# Patient Record
Sex: Male | Born: 1950 | Race: White | Hispanic: No | Marital: Married | Smoking: Never smoker
Health system: Southern US, Community
[De-identification: ages and names within clinical notes are randomized; demographics above are authoritative.]

## PROBLEM LIST (undated history)

## (undated) DIAGNOSIS — K219 Gastro-esophageal reflux disease without esophagitis: Secondary | ICD-10-CM

## (undated) DIAGNOSIS — E119 Type 2 diabetes mellitus without complications: Secondary | ICD-10-CM

## (undated) DIAGNOSIS — E785 Hyperlipidemia, unspecified: Secondary | ICD-10-CM

## (undated) DIAGNOSIS — Z889 Allergy status to unspecified drugs, medicaments and biological substances status: Secondary | ICD-10-CM

## (undated) DIAGNOSIS — I10 Essential (primary) hypertension: Secondary | ICD-10-CM

## (undated) DIAGNOSIS — I2510 Atherosclerotic heart disease of native coronary artery without angina pectoris: Secondary | ICD-10-CM

## (undated) DIAGNOSIS — F419 Anxiety disorder, unspecified: Secondary | ICD-10-CM

## (undated) HISTORY — DX: Essential (primary) hypertension: I10

## (undated) HISTORY — DX: Atherosclerotic heart disease of native coronary artery without angina pectoris: I25.10

## (undated) HISTORY — DX: Allergy status to unspecified drugs, medicaments and biological substances: Z88.9

## (undated) HISTORY — DX: Anxiety disorder, unspecified: F41.9

## (undated) HISTORY — DX: Gastro-esophageal reflux disease without esophagitis: K21.9

## (undated) HISTORY — DX: Hyperlipidemia, unspecified: E78.5

## (undated) HISTORY — DX: Type 2 diabetes mellitus without complications: E11.9

---

## 1989-02-18 HISTORY — PX: LUMBAR LAMINECTOMY: SHX95

## 2000-11-18 DIAGNOSIS — K649 Unspecified hemorrhoids: Secondary | ICD-10-CM | POA: Insufficient documentation

## 2006-02-07 ENCOUNTER — Ambulatory Visit: Payer: Self-pay | Admitting: Gastroenterology

## 2006-02-26 ENCOUNTER — Ambulatory Visit: Payer: Self-pay | Admitting: Gastroenterology

## 2006-02-26 ENCOUNTER — Encounter (INDEPENDENT_AMBULATORY_CARE_PROVIDER_SITE_OTHER): Payer: Self-pay | Admitting: Specialist

## 2006-06-03 ENCOUNTER — Encounter (INDEPENDENT_AMBULATORY_CARE_PROVIDER_SITE_OTHER): Payer: Self-pay

## 2006-06-03 DIAGNOSIS — M549 Dorsalgia, unspecified: Secondary | ICD-10-CM | POA: Insufficient documentation

## 2006-10-14 ENCOUNTER — Telehealth (INDEPENDENT_AMBULATORY_CARE_PROVIDER_SITE_OTHER): Payer: Self-pay | Admitting: *Deleted

## 2006-10-27 ENCOUNTER — Ambulatory Visit: Payer: Self-pay | Admitting: Gastroenterology

## 2006-11-05 ENCOUNTER — Encounter: Payer: Self-pay | Admitting: Gastroenterology

## 2006-11-05 ENCOUNTER — Ambulatory Visit: Payer: Self-pay | Admitting: Gastroenterology

## 2006-11-11 ENCOUNTER — Ambulatory Visit: Payer: Self-pay | Admitting: Critical Care Medicine

## 2007-01-12 ENCOUNTER — Ambulatory Visit: Payer: Self-pay | Admitting: Gastroenterology

## 2007-03-14 DIAGNOSIS — I10 Essential (primary) hypertension: Secondary | ICD-10-CM | POA: Insufficient documentation

## 2007-03-14 DIAGNOSIS — K449 Diaphragmatic hernia without obstruction or gangrene: Secondary | ICD-10-CM | POA: Insufficient documentation

## 2007-03-14 DIAGNOSIS — K219 Gastro-esophageal reflux disease without esophagitis: Secondary | ICD-10-CM | POA: Insufficient documentation

## 2007-03-14 DIAGNOSIS — D126 Benign neoplasm of colon, unspecified: Secondary | ICD-10-CM | POA: Insufficient documentation

## 2007-03-14 DIAGNOSIS — N200 Calculus of kidney: Secondary | ICD-10-CM | POA: Insufficient documentation

## 2008-02-19 HISTORY — PX: ANGIOPLASTY: SHX39

## 2008-07-12 ENCOUNTER — Inpatient Hospital Stay (HOSPITAL_BASED_OUTPATIENT_CLINIC_OR_DEPARTMENT_OTHER): Admission: RE | Admit: 2008-07-12 | Discharge: 2008-07-12 | Payer: Self-pay | Admitting: Interventional Cardiology

## 2008-07-12 ENCOUNTER — Inpatient Hospital Stay (HOSPITAL_COMMUNITY): Admission: AD | Admit: 2008-07-12 | Discharge: 2008-07-13 | Payer: Self-pay | Admitting: Cardiology

## 2010-05-29 LAB — BASIC METABOLIC PANEL
BUN: 9 mg/dL (ref 6–23)
CO2: 31 mEq/L (ref 19–32)
Calcium: 8.3 mg/dL — ABNORMAL LOW (ref 8.4–10.5)
Chloride: 104 mEq/L (ref 96–112)
Creatinine, Ser: 1.1 mg/dL (ref 0.4–1.5)
GFR calc Af Amer: >60 mL/min (ref >60)
GFR calc non Af Amer: >60 mL/min (ref >60)
Glucose, Bld: 96 mg/dL (ref 70–99)
Potassium: 3.8 mEq/L (ref 3.5–5.1)
Sodium: 138 mEq/L (ref 135–145)

## 2010-05-29 LAB — CBC
HCT: 42 % (ref 39.0–52.0)
Hemoglobin: 14.8 g/dL (ref 13.0–17.0)
MCHC: 35.3 g/dL (ref 30.0–36.0)
MCV: 91.1 fL (ref 78.0–100.0)
Platelets: 101 10*3/uL — ABNORMAL LOW (ref 150–400)
RBC: 4.61 MIL/uL (ref 4.22–5.81)
RDW: 13.5 % (ref 11.5–15.5)
WBC: 5.7 10*3/uL (ref 4.0–10.5)

## 2010-07-03 NOTE — Discharge Summary (Signed)
[name], [name]             ACCOUNT NO.:  [id number]   MEDICAL RECORD NO.:  [id number]          PATIENT TYPE:  INP   LOCATION:  2508                         FACILITY:  [facility]   PHYSICIAN:  Jake Bathe, MD      DATE OF BIRTH:  04-02-1950   DATE OF ADMISSION:  07/12/2008  DATE OF DISCHARGE:  07/13/2008                               DISCHARGE SUMMARY   DISCHARGE DIAGNOSES:  1. Coronary artery disease, status post drug-eluting stent to the left      anterior descending.  2. Hypertension.  3. Benign prostatic hypertrophy.  4. Dyslipidemia.  5. Gastroesophageal reflux disease.  6. Depression.  7. Chronic back pain.  8. Erectile dysfunction.  9. Seasonal allergic rhinitis.   HOSPITAL COURSE:  [name] is a 60 year old male patient, who is  initially seen in the office for evaluation of exertional chest pain.  It was decided that he would have a cardiac catheterization at that  point because of his symptoms that were some for angina.  He went to the  cath lab on Jul 12, 2008, and was found to have no disease in the left  main.  The LAD had a proximal 95% lesion, over the first diagonal hazy  proximal 50-60% lesion, and at the second diagonal, there was 50-60%  stenosis.  The circumflex had minimal disease and the right coronary  artery had a 50% mid RCA lesion.  There were some mild hypokinesis of  the anterior wall, but the EF was normal.   At this point, Dr. Corliss Marcus intervened and placed a Taxus drug-  eluting stent (3.5/16) into the vessel.  The patient tolerated the  procedure well, went home the next day.   LABORATORY STUDIES:  Hemoglobin 14.8, hematocrit 42.0, white count 5.7,  and platelets 101.  Sodium 138, potassium 3.8, BUN 9, and creatinine  1.10.   The patient is being discharged to home in stable, but improved  condition.  He is to clean his cath site gently with soap and water.  No  scrubbing.  Remain on a low-sodium, heart-healthy diet.  Increase  activity slowly.  No lifting over 10 pounds for 1 week.  No driving for  2 days.  Follow up with Dr. Anne Fu on July 25, 2008, at 12:15 p.m.   DISCHARGE MEDICATIONS:  1. Propecia 1 a day.  2. Gabapentin 300 mg twice a day.  3. Vitamin E daily.  4. Tylenol as needed.  5. Saw palmetto as before.  6. Diovan HCTZ 80/12.5 mg 1 tablet daily.  7. Enteric-coated aspirin 325 mg a day.  8. Metoprolol 50 mg a day.  This will be stopped and we will change      this to metoprolol ER 25 mg once a day.  With this, his heart rate      was in the 50s.  During his hospitalization, we decreased the dose.  9. Lexapro 10 mg a day.  10.Omeprazole 40 mg a day.  11.Plavix 75 mg a day.       Guy Franco, P.A.      Jake Bathe,  MD  Electronically Signed    LB/MEDQ  D:  07/13/2008  T:  07/13/2008  Job:  [id number]   cc:   Thora Lance, M.D.  Jake Bathe, MD

## 2010-07-03 NOTE — Assessment & Plan Note (Signed)
[facility]                         GASTROENTEROLOGY OFFICE NOTE   [name], ASHBY                 MRN:          [id number]  DATE:01/12/2007                            DOB:          October 03, 1950    This is a return office visit for a chronic cough.  His symptoms are  occasionally precipitated by eating or drinking.  His symptoms are also  brought on by speaking for even a moderately long period of time.  His  symptoms have improved on Prilosec 20 mg p.o. b.i.d.  Upper endoscopy  revealed a hiatal hernia and some mild distal esophageal erythema.  The  esophageal biopsies were however entirely normal.   CURRENT MEDICATIONS:  Listed on the chart, updated and reviewed.   MEDICATION ALLERGIES:  NONE KNOWN.   EXAMINATION:  No acute distress.  Weight 202.4 pounds, blood pressure is  102/60, pulse 64 and regular.  CHEST:  Clear to auscultation bilaterally.  CARDIAC:  Regular rate and rhythm without murmurs.  ABDOMEN:  Soft and nontender with normoactive bowel sounds.   ASSESSMENT/PLAN:  Cough, hoarseness and vocal fatigue.  Presumed  gastroesophageal reflux disease with LPR.  His symptoms have improved  but have not resolved.  Changed to Nexium 40 mg p.o. b.i.d. taken 30  minutes before breakfast and dinner.  Maintain all standard anti-reflux  measures including the use of 4 inch bed blocks.  Return office visit 3  months.     [name]. Russella Dar, MD, [facility]  Electronically Signed    MTS/MedQ  DD: 01/12/2007  DT: 01/12/2007  Job #: [id number]   cc:   Thora Lance, M.D.  Charlcie Cradle Delford Field, MD, FCCP

## 2010-07-03 NOTE — Cardiovascular Report (Signed)
[name], [name]             ACCOUNT NO.:  [id number]   MEDICAL RECORD NO.:  [id number]          PATIENT TYPE:  OIB   LOCATION:  1965                         FACILITY:  [facility]   PHYSICIAN:  Jake Bathe, MD      DATE OF BIRTH:  07-09-1950   DATE OF PROCEDURE:  07/12/2008  DATE OF DISCHARGE:  07/12/2008                            CARDIAC CATHETERIZATION   PROCEDURES:  1. Left heart catheterization.  2. Selective coronary angiography.  3. Left ventriculogram.   INDICATIONS:  Angina.  A 60 year old male with exertional angina, left-  sided chest pain, no prior CAD history, no smoking, no family history,  treated for both hypertension and hyperlipidemia.   PROCEDURE DETAILS:  Informed consent was obtained.  Risk of stroke,  heart attack, death, renal impairment, bleeding were explained to the  patient at length.  He was placed in the catheterization table and  prepped in a sterile fashion.  Lidocaine 1% was used for local  anesthesia after visualizing the femoral head with fluoroscopy.  The 4-  French sheath was placed into the right femoral vein using the modified  Seldinger technique.  Judkins left #4 catheter was used to selectively  cannulate the left main artery and no torque Williams right catheter was  used to selectively cannulate the right coronary artery.  Multiple views  with hand injection of Omnipaque were obtained.  The angled pigtail was  then used to cross the aortic valve into the left ventricle.  Hemodynamics obtained and a left ventriculogram in the RAO position  utilizing power injection of 30 mL of contrast was obtained.  Pullback  obtained.  After the procedure, the sheath was sewn in place and  findings were discussed with both he and Dr. Eldridge Dace.   FINDINGS:  1. Left main artery - branches into the LAD and circumflex artery.      There was no angiographically significant coronary artery disease.  2. Left anterior descending artery - there was severe  proximal 95%      stenosis, encompassing the first diagonal branch which has      approximately 50% ostial stenosis, which is slightly hazy.  His mid      LAD also has a 50-60% lesion just at the second diagonal      bifurcation.  There is TIMI II grade flow throughout the LAD.  3. Circumflex artery - there were two obtuse marginal branches - no      angiographically significant coronary artery disease present.  4. Right coronary artery - there is focal 40-50% mid RCA lesion      appears nonobstructive, eccentric.  Otherwise, no angiographically      significant coronary artery disease.   Left ventriculogram - there is mild mid anterior wall hypokinesis.  Otherwise, preserved ejection fraction of 60%, systolic pressure is  103/22 with an aortic pressure of 100/50 with no significant gradient.  No significant mitral regurgitation present.   IMPRESSION:  1. Severe left anterior descending coronary artery disease in the      proximal and mid distribution.  2. Nonobstructive disease in the right coronary artery, mid  region, 40-      50%.  3. Mild anterior wall mid hypokinesis with otherwise preserved      ejection fraction of 60%.  4. Elevated left ventricular end-diastolic pressure consistent with      diastolic dysfunction.   PLAN:  Findings have been discussed with both Dr. Eldridge Dace and Dr. Corliss Marcus.  We will go ahead and give him 600 mg of Plavix now, bolus him  with heparin 4000 units so in the sheath and take him upstairs to the  cardiac catheterization lab for urgent percutaneous intervention.  He is  currently chest pain-free.   FINDINGS:  Findings have been explained to both the patient and his  family.      Jake Bathe, MD  Electronically Signed     MCS/MEDQ  D:  07/12/2008  T:  07/13/2008  Job:  [id number]

## 2010-07-03 NOTE — Assessment & Plan Note (Signed)
[facility]                         GASTROENTEROLOGY OFFICE NOTE   [name], [name]                 MRN:          [id number]  DATE:10/27/2006                            DOB:          01-09-1951    REFERRING PHYSICIAN:  Thora Lance, M.D.   REASON FOR CONSULTATION:  Dysphagia and GERD.   HISTORY OF PRESENT ILLNESS:  The patient is a 60 year old white male  formerly followed by Dr. Victorino Dike.  He underwent colonoscopy in  January 2008, which showed a small adenomatous colon polyp.  He has had  a history of GERD complicated by peptic strictures in the past and  underwent upper endoscopy with [facility] dilation by Dr. Victorino Dike in  December 1991.  A small hiatal hernia, erosive esophagitis and a distal  esophageal stricture was noted at that examination.  The patient states  that he has taken acid suppressants intermittently over the years.  Several weeks ago, he developed a severe cough with associated fatigue.  Since that time, he has noted intermittent difficulty swallowing solids  and liquids.  He notes occasional nausea and some intermittent sharp  right upper quadrant pain.  He has noted bright red blood per rectum  with several bowel movements.  Hemorrhoids were not noted on his  colonoscopy from earlier this year.  He has had problems with  hoarseness, vocal fatigue and a sore throat that had been ongoing for  several weeks.  There is no family history of colon cancer, colon polyps  or inflammatory bowel disease.   PAST MEDICAL HISTORY:  1. Hypertension.  2. Kidney stones.  3. Adenomatous colon polyps.  4. 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  5. Status post inguinal hernia repair.  6. Status post back surgery in 1991, 1992 and 1994.  7. Status post right knee surgery in 1993.   CURRENT MEDICATIONS:  Listed on the chart, updated and reviewed.   ALLERGIES:  NO KNOWN DRUG ALLERGIES.   SOCIAL HISTORY:  Per  handwritten form.   REVIEW OF SYSTEMS:  Per handwritten form.   PHYSICAL EXAMINATION:  VITAL SIGNS:  Height 5 feet 11 inches, weight  194.6 pounds.  Blood pressure 112/60, pulse 56 and regular.  GENERAL APPEARANCE:  A well-developed, well-nourished white male in no  acute distress.  HEENT:  Sclerae are anicteric.  Oropharynx clear.  CHEST:  Clear to auscultation bilaterally.  CARDIOVASCULAR:  Regular rate and rhythm without murmur appreciated.  ABDOMEN:  Soft, nontender, nondistended with normal active bowel sounds,  no palpable organomegaly, masses or hernias.  NEUROLOGIC:  Alert and oriented x3.  Grossly nonfocal.   ASSESSMENT/PLAN:  1. Presumed gastroesophageal reflux disease with solid and liquid      dysphagia.  His symptoms may be related to pharyngeal irritation      and his ongoing cough.  Need to exclude erosive esophagitis, peptic      strictures, infectious esophagitis and other disorders.  Change      Prilosec to 40 mg p.o. q.a.m. and begin all standard antireflux      measures.  Risks, benefits, and alternatives to upper endoscopy  with possible biopsy and possible dilatation discussed with the      patient and he consents to proceed.  This will be scheduled      electively.  Both he and his wife had multiple questions about      esophageal dilatation as he has had a friend who passed away      following a perforation caused by esophageal dilation. I answered      all their questions to their satisfaction.  2. Personal history of adenomatous colon polyps and recent small      volume hematochezia.  If his hematochezia persists, he will return      for flexible sigmoidoscopy for further evaluation.  Recall      colonoscopy recommended for January 2013.     [name]. Russella Dar, MD, [employer]  Electronically Signed    MTS/MedQ  DD: 10/27/2006  DT: 10/28/2006  Job #: [id number]

## 2010-07-03 NOTE — Assessment & Plan Note (Signed)
[facility]                             PULMONARY OFFICE NOTE   DELMAS, FAUCETT                 MRN:          [id number]  DATE:11/11/2006                            DOB:          Jun 16, 1950    Mr. Kakar is a 60 year old white male who has had chronic cough now  for 8 weeks, mainly clear mucus is produced, but most of the cough is  dry.  He denies any sore throat.  He has had ongoing heartburn and  reflux symptoms for which he has had an evaluation by Dr. Russella Dar,  endoscopy shows esophagitis and distal stricture of the esophageus.  This has recently been dilated.  He is on Prilosec now 40 mg daily.  He  awakens at night with cough.  He is worse if he lies flat.  He begins to  cough if he talks too much.  There is no real relief of the cough.  He  has no dysphagia, no post-nasal drip, no nasal congestion.  He is not  short of breath at rest or with exertion.  He has no wheezing.  He is a  life-long never-smoker.  Referred now for further evaluation.   PAST MEDICAL HISTORY:  History of hypertension.  No heart disease.  No  stroke, blood clots, cancer, renal disease, sleep disorders, or  disorders of the central nervous system.  He has had back surgery in  1992.   MEDICATION ALLERGIES:  None.   SOCIAL HISTORY:  Nonsmoker.  Lives at home with his wife.  Is a  Surveyor, minerals.   FAMILY HISTORY:  Mother had breast cancer.   REVIEW OF SYSTEMS:  Otherwise, noncontributory.   CURRENT MEDICATIONS:  1. Gabapentin 600 mg daily.  2. Prilosec 40 mg daily.  3. Lexapro 10 mg daily.  4. Diovan/hydrochlorothiazide 80/12.5 daily.  5. Metoprolol 50 mg daily.   EXAM:  This is a middle-aged male in no distress.  Temperature 97, blood pressure 110/68, pulse 50, saturation 89% on room  air.  CHEST:  Showed to be clear with no evidence of wheeze, rales, or  rhonchi.  CARDIAC:  Showed a regular rate and rhythm without S3.  Normal S1, S2.  ABDOMEN:  Soft,  slightly tender in the epigastric area.  EXTREMITIES:  No clubbing, edema, or venous disease.  SKIN:  Clear.  NEUROLOGIC:  Intact.  HEENT:  No jugular venous distension.  No lymphadenopathy.   Spirometry showed moderate severe restrictive defect, but no evidence of  obstructive defect.  Chest x-ray showed no active lung disease form September 10, 2006.   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   PLAN:  Cyclic cough protocol utilizing Tussionex and Tessalon pearls.  Begin Zegerid 40 mg daily for 1 month interval of time then switch back  to Prilosec daily.  Reflux diet was given to the patient.  No other  pulmonary workup is indicated.  Will see the patient back in 2 months.     Charlcie Cradle Delford Field, MD, [facility]  Electronically Signed    PEW/MedQ  DD: 11/11/2006  DT: 11/11/2006  Job #: [id number]   cc:   [name]. Russella Dar, MD, [facility]  Thora Lance, M.D.

## 2011-02-05 ENCOUNTER — Encounter: Payer: Self-pay | Admitting: Gastroenterology

## 2011-02-25 ENCOUNTER — Other Ambulatory Visit: Payer: Self-pay | Admitting: Dermatology

## 2011-05-29 ENCOUNTER — Encounter: Payer: Self-pay | Admitting: Gastroenterology

## 2011-05-29 ENCOUNTER — Ambulatory Visit (AMBULATORY_SURGERY_CENTER): Payer: Medicare Other | Admitting: *Deleted

## 2011-05-29 VITALS — Ht 70.5 in | Wt 207.5 lb

## 2011-05-29 DIAGNOSIS — Z1211 Encounter for screening for malignant neoplasm of colon: Secondary | ICD-10-CM

## 2011-05-29 MED ORDER — PEG-KCL-NACL-NASULF-NA ASC-C 100 G PO SOLR: ORAL | Status: DC

## 2011-06-10 ENCOUNTER — Other Ambulatory Visit: Payer: Self-pay | Admitting: Gastroenterology

## 2011-06-12 ENCOUNTER — Ambulatory Visit (AMBULATORY_SURGERY_CENTER): Payer: Medicare Other | Admitting: Gastroenterology

## 2011-06-12 ENCOUNTER — Encounter: Payer: Self-pay | Admitting: Gastroenterology

## 2011-06-12 VITALS — BP 125/55 | HR 47 | Temp 97.5°F | Resp 18 | Ht 70.5 in | Wt 207.0 lb

## 2011-06-12 DIAGNOSIS — Z1211 Encounter for screening for malignant neoplasm of colon: Secondary | ICD-10-CM

## 2011-06-12 DIAGNOSIS — Z86010 Personal history of colonic polyps: Secondary | ICD-10-CM

## 2011-06-12 MED ORDER — SODIUM CHLORIDE 0.9 % IV SOLN: 500.0000 mL | INTRAVENOUS | Status: DC

## 2011-06-12 NOTE — Op Note (Signed)
[facility] [address] [place], [place]  [address]  COLONOSCOPY PROCEDURE REPORT  PATIENT:  [name], [name]  MR#:  [id number] BIRTHDATE:  04-Sep-1950, 61 yrs. old  GENDER:  male ENDOSCOPIST:  Judie Petit T. Russella Dar, MD, [facility]  PROCEDURE DATE:  06/12/2011 PROCEDURE:  Colonoscopy 34742 ASA CLASS:  Class II INDICATIONS:  1) surveillance and high-risk screening  2) history of pre-cancerous (adenomatous) colon polyps: 02/2006 MEDICATIONS:   MAC sedation, administered by CRNA, propofol (Diprivan) 240 mg IV DESCRIPTION OF PROCEDURE:   After the risks benefits and alternatives of the procedure were thoroughly explained, informed consent was obtained.  Digital rectal exam was performed and revealed no abnormalities.   The LB CF-[id number] endoscope was introduced through the anus and advanced to the cecum, which was identified by both the appendix and ileocecal valve, without limitations.  The quality of the prep was excellent, using MoviPrep.  The instrument was then slowly withdrawn as the colon was fully examined. <<PROCEDUREIMAGES>> FINDINGS:  A normal appearing cecum, ileocecal valve, and appendiceal orifice were identified. The ascending, hepatic flexure, transverse, splenic flexure, descending, sigmoid colon, and rectum appeared unremarkable.   Retroflexed views in the rectum revealed no abnormalities.  The time to cecum =  1.5 minutes. The scope was then withdrawn (time =  8  min) from the patient and the procedure completed.  COMPLICATIONS:  None  ENDOSCOPIC IMPRESSION: 1) Normal colon  RECOMMENDATIONS: 1) Repeat Colonoscopy in 5 years.  [name]. Russella Dar, MD, [facility]  CC:  Kirby Funk, MD  n. Rosalie DoctorVenita Lick. Sheala Dosh at 06/12/2011 10:22 AM  Janean Sark, [phone]

## 2011-06-12 NOTE — Patient Instructions (Signed)
Discharge instructions given with verbal understanding. Resume previous medications.YOU HAD AN ENDOSCOPIC PROCEDURE TODAY AT THE [facility]: Refer to the procedure report that was given to you for any specific questions about what was found during the examination.  If the procedure report does not answer your questions, please call your gastroenterologist to clarify.  If you requested that your care partner not be given the details of your procedure findings, then the procedure report has been included in a sealed envelope for you to review at your convenience later.  YOU SHOULD EXPECT: Some feelings of bloating in the abdomen. Passage of more gas than usual.  Walking can help get rid of the air that was put into your GI tract during the procedure and reduce the bloating. If you had a lower endoscopy (such as a colonoscopy or flexible sigmoidoscopy) you may notice spotting of blood in your stool or on the toilet paper. If you underwent a bowel prep for your procedure, then you may not have a normal bowel movement for a few days.  DIET: Your first meal following the procedure should be a light meal and then it is ok to progress to your normal diet.  A half-sandwich or bowl of soup is an example of a good first meal.  Heavy or fried foods are harder to digest and may make you feel nauseous or bloated.  Likewise meals heavy in dairy and vegetables can cause extra gas to form and this can also increase the bloating.  Drink plenty of fluids but you should avoid alcoholic beverages for 24 hours.  ACTIVITY: Your care partner should take you home directly after the procedure.  You should plan to take it easy, moving slowly for the rest of the day.  You can resume normal activity the day after the procedure however you should NOT DRIVE or use heavy machinery for 24 hours (because of the sedation medicines used during the test).    SYMPTOMS TO REPORT IMMEDIATELY: A gastroenterologist can be reached at  any hour.  During normal business hours, 8:30 AM to 5:00 PM Monday through Friday, call [phone].  After hours and on weekends, please call the GI answering service at [phone] who will take a message and have the physician on call contact you.   Following lower endoscopy (colonoscopy or flexible sigmoidoscopy):  Excessive amounts of blood in the stool  Significant tenderness or worsening of abdominal pains  Swelling of the abdomen that is new, acute  Fever of 100F or higher  FOLLOW UP: If any biopsies were taken you will be contacted by phone or by letter within the next 1-3 weeks.  Call your gastroenterologist if you have not heard about the biopsies in 3 weeks.  Our staff will call the home number listed on your records the next business day following your procedure to check on you and address any questions or concerns that you may have at that time regarding the information given to you following your procedure. This is a courtesy call and so if there is no answer at the home number and we have not heard from you through the emergency physician on call, we will assume that you have returned to your regular daily activities without incident.  SIGNATURES/CONFIDENTIALITY: You and/or your care partner have signed paperwork which will be entered into your electronic medical record.  These signatures attest to the fact that that the information above on your After Visit Summary has been reviewed and is understood.    Full responsibility of the confidentiality of this discharge information lies with you and/or your care-partner.  

## 2011-06-12 NOTE — Progress Notes (Signed)
Patient did not experience any of the following events: a burn prior to discharge; a fall within the facility; wrong site/side/patient/procedure/implant event; or a hospital transfer or hospital admission upon discharge from the facility. (G8907) Patient did not have preoperative order for IV antibiotic SSI prophylaxis. (G8918)  

## 2011-06-13 ENCOUNTER — Telehealth: Payer: Self-pay

## 2011-06-13 NOTE — Telephone Encounter (Signed)
  Follow up Call-  Call back number 06/12/2011  Post procedure Call Back phone  # [phone]  Permission to leave phone message Yes     Patient questions:  Do you have a fever, pain , or abdominal swelling? no Pain Score  0 *  Have you tolerated food without any problems? yes  Have you been able to return to your normal activities? yes  Do you have any questions about your discharge instructions: Diet   no Medications  no Follow up visit  no  Do you have questions or concerns about your Care? no  Actions: * If pain score is 4 or above: No action needed, pain <4.

## 2012-01-30 ENCOUNTER — Other Ambulatory Visit: Payer: Self-pay | Admitting: Internal Medicine

## 2012-01-30 ENCOUNTER — Ambulatory Visit
Admission: RE | Admit: 2012-01-30 | Discharge: 2012-01-30 | Disposition: A | Discharge status: Home / Self Care | Payer: Medicare Other | Source: Ambulatory Visit | Attending: Internal Medicine | Admitting: Internal Medicine

## 2012-01-30 DIAGNOSIS — M545 Low back pain, unspecified: Secondary | ICD-10-CM

## 2012-01-30 DIAGNOSIS — R109 Unspecified abdominal pain: Secondary | ICD-10-CM

## 2013-01-26 ENCOUNTER — Ambulatory Visit: Payer: Medicare Other | Admitting: Cardiology

## 2013-01-28 ENCOUNTER — Encounter: Payer: Self-pay | Admitting: Cardiology

## 2013-01-29 ENCOUNTER — Ambulatory Visit (INDEPENDENT_AMBULATORY_CARE_PROVIDER_SITE_OTHER): Payer: Medicare Other | Admitting: Cardiology

## 2013-01-29 ENCOUNTER — Encounter: Payer: Self-pay | Admitting: Cardiology

## 2013-01-29 VITALS — BP 120/72 | HR 53 | Ht 70.5 in | Wt 202.0 lb

## 2013-01-29 DIAGNOSIS — E7800 Pure hypercholesterolemia, unspecified: Secondary | ICD-10-CM

## 2013-01-29 DIAGNOSIS — I10 Essential (primary) hypertension: Secondary | ICD-10-CM

## 2013-01-29 DIAGNOSIS — I2510 Atherosclerotic heart disease of native coronary artery without angina pectoris: Secondary | ICD-10-CM

## 2013-01-29 DIAGNOSIS — E1169 Type 2 diabetes mellitus with other specified complication: Secondary | ICD-10-CM | POA: Insufficient documentation

## 2013-01-29 HISTORY — DX: Atherosclerotic heart disease of native coronary artery without angina pectoris: I25.10

## 2013-01-29 NOTE — Patient Instructions (Signed)
Your physician recommends that you continue on your current medications as directed. Please refer to the Current Medication list given to you today.  Your physician wants you to follow-up in: 1 year with dr. Skains. You will receive a reminder letter in the mail two months in advance. If you don't receive a letter, please call our office to schedule the follow-up appointment.  

## 2013-01-29 NOTE — Progress Notes (Signed)
[address], [address] [place], [place]  [address] Phone: [phone] Fax:  [phone]  Date:  01/29/2013   ID:  [name], DOB 04-14-1950, MRN [id number]  PCP:  Lillia Mountain, MD   History of Present Illness: [name] is a 62 y.o. male with coronary artery disease status post drug-eluting stent in May of 2010 to his proximal LAD with EF of 60% here for followup. Lipids last checked were excellent. We decided to discontinue his Zetia and change him to atorvastatin. Discussed the rationale for that. His blood pressure is under excellent control currently. Overall he is doing excellent without any chest pain (Except for fleeting sometimes GERD-like discomfort.),no shortness of breath, syncope, fevers.  Denies any chest pain/exertional chest pain. Occasionally while laying down will feel a twinge of left-sided chest discomfort. Fleeting. Enjoying his part-time job with [employer]. Went to [facility]. [employer]. One of the drivers has type 1 diabetes.     Wt Readings from Last 3 Encounters:  01/29/13 202 lb (91.627 kg)  06/12/11 207 lb (93.895 kg)  05/29/11 207 lb 8 oz (94.121 kg)     Past Medical History  Diagnosis Date  . Hypertension   . Hx of seasonal allergies   . Anxiety   . CAD (coronary artery disease)   . Hyperlipidemia     Past Surgical History  Procedure Laterality Date  . Angioplasty  2010    stent  . Lumbar laminectomy  1991    L3 and T12    Current Outpatient Prescriptions  Medication Sig Dispense Refill  . cholecalciferol (VITAMIN D) 1000 UNITS tablet Take 1,000 Units by mouth daily.      . citalopram (CELEXA) 20 MG tablet Take 20 mg by mouth daily.       Marland Kitchen co-enzyme Q-10 30 MG capsule Take 30 mg by mouth daily.      . fluorouracil (EFUDEX) 5 % cream Apply topically as needed.       . gabapentin (NEURONTIN) 300 MG capsule Takes 2 tablets daily      . metoprolol tartrate (LOPRESSOR) 25 MG tablet Take 25 mg by mouth daily.        . ranitidine (ZANTAC) 150 MG tablet Pt is taking 3-4 tablets a week      . saw palmetto 160 MG capsule Take 160 mg by mouth daily.      . valsartan-hydrochlorothiazide (DIOVAN-HCT) 80-12.5 MG per tablet Take 1 tablet by mouth daily.        No current facility-administered medications for this visit.    Allergies:   No Known Allergies  Social History:  The patient  reports that he has never smoked. He has never used smokeless tobacco. He reports that he drinks alcohol. He reports that he does not use illicit drugs.   ROS:  Please see the history of present illness.   Denies any syncope, bleeding, orthopnea, PND    PHYSICAL EXAM: VS:  BP 120/72  Pulse 53  Ht 5' 10.5" (1.791 m)  Wt 202 lb (91.627 kg)  BMI 28.56 kg/m2 Well nourished, well developed, in no acute distress HEENT: normal Neck: no JVD Cardiac:  normal S1, S2; RRR; no murmur Lungs:  clear to auscultation bilaterally, no wheezing, rhonchi or rales Abd: soft, nontender, no hepatomegaly Ext: no edema Skin: warm and dry Neuro: no focal abnormalities noted  EKG:  Sinus bradycardia rate 53 with no other abnormalities.     ASSESSMENT AND PLAN:  1. Coronary artery disease-currently doing well, no exertional anginal symptoms. Reviewed anatomy as above. Aggressive secondary prevention. 2. Angina-currently well controlled. 3. Hyperlipidemia-currently on atorvastatin. Previously we had stopped his Zetia. Triglycerides were slightly elevated at 230 at last check 1/14. LDL 53.]  Signed, Donato Schultz, MD [facility]  01/29/2013 2:18 PM

## 2013-03-19 DIAGNOSIS — Z23 Encounter for immunization: Secondary | ICD-10-CM | POA: Diagnosis not present

## 2013-03-19 DIAGNOSIS — E782 Mixed hyperlipidemia: Secondary | ICD-10-CM | POA: Diagnosis not present

## 2013-03-19 DIAGNOSIS — F325 Major depressive disorder, single episode, in full remission: Secondary | ICD-10-CM | POA: Diagnosis not present

## 2013-03-19 DIAGNOSIS — K219 Gastro-esophageal reflux disease without esophagitis: Secondary | ICD-10-CM | POA: Diagnosis not present

## 2013-03-19 DIAGNOSIS — Z125 Encounter for screening for malignant neoplasm of prostate: Secondary | ICD-10-CM | POA: Diagnosis not present

## 2013-03-19 DIAGNOSIS — M79609 Pain in unspecified limb: Secondary | ICD-10-CM | POA: Diagnosis not present

## 2013-03-19 DIAGNOSIS — Z1331 Encounter for screening for depression: Secondary | ICD-10-CM | POA: Diagnosis not present

## 2013-03-19 DIAGNOSIS — I10 Essential (primary) hypertension: Secondary | ICD-10-CM | POA: Diagnosis not present

## 2013-11-02 DIAGNOSIS — Z23 Encounter for immunization: Secondary | ICD-10-CM | POA: Diagnosis not present

## 2013-11-02 DIAGNOSIS — M549 Dorsalgia, unspecified: Secondary | ICD-10-CM | POA: Diagnosis not present

## 2013-11-02 DIAGNOSIS — I10 Essential (primary) hypertension: Secondary | ICD-10-CM | POA: Diagnosis not present

## 2013-11-12 DIAGNOSIS — S83419A Sprain of medial collateral ligament of unspecified knee, initial encounter: Secondary | ICD-10-CM | POA: Diagnosis not present

## 2013-11-12 DIAGNOSIS — M25569 Pain in unspecified knee: Secondary | ICD-10-CM | POA: Diagnosis not present

## 2014-01-18 ENCOUNTER — Other Ambulatory Visit: Payer: Self-pay | Admitting: Dermatology

## 2014-01-18 DIAGNOSIS — L821 Other seborrheic keratosis: Secondary | ICD-10-CM | POA: Diagnosis not present

## 2014-01-18 DIAGNOSIS — D0462 Carcinoma in situ of skin of left upper limb, including shoulder: Secondary | ICD-10-CM | POA: Diagnosis not present

## 2014-01-18 DIAGNOSIS — D485 Neoplasm of uncertain behavior of skin: Secondary | ICD-10-CM | POA: Diagnosis not present

## 2014-01-18 DIAGNOSIS — Z85828 Personal history of other malignant neoplasm of skin: Secondary | ICD-10-CM | POA: Diagnosis not present

## 2014-01-18 DIAGNOSIS — L570 Actinic keratosis: Secondary | ICD-10-CM | POA: Diagnosis not present

## 2014-03-03 DIAGNOSIS — Z23 Encounter for immunization: Secondary | ICD-10-CM | POA: Diagnosis not present

## 2014-03-03 DIAGNOSIS — N419 Inflammatory disease of prostate, unspecified: Secondary | ICD-10-CM | POA: Diagnosis not present

## 2014-03-03 DIAGNOSIS — N400 Enlarged prostate without lower urinary tract symptoms: Secondary | ICD-10-CM | POA: Diagnosis not present

## 2014-03-30 DIAGNOSIS — F325 Major depressive disorder, single episode, in full remission: Secondary | ICD-10-CM | POA: Diagnosis not present

## 2014-03-30 DIAGNOSIS — N400 Enlarged prostate without lower urinary tract symptoms: Secondary | ICD-10-CM | POA: Diagnosis not present

## 2014-03-30 DIAGNOSIS — I10 Essential (primary) hypertension: Secondary | ICD-10-CM | POA: Diagnosis not present

## 2014-03-30 DIAGNOSIS — Z1389 Encounter for screening for other disorder: Secondary | ICD-10-CM | POA: Diagnosis not present

## 2014-03-30 DIAGNOSIS — Z0000 Encounter for general adult medical examination without abnormal findings: Secondary | ICD-10-CM | POA: Diagnosis not present

## 2014-03-30 DIAGNOSIS — G4700 Insomnia, unspecified: Secondary | ICD-10-CM | POA: Diagnosis not present

## 2014-03-30 DIAGNOSIS — K219 Gastro-esophageal reflux disease without esophagitis: Secondary | ICD-10-CM | POA: Diagnosis not present

## 2014-03-30 DIAGNOSIS — I2510 Atherosclerotic heart disease of native coronary artery without angina pectoris: Secondary | ICD-10-CM | POA: Diagnosis not present

## 2014-03-30 DIAGNOSIS — E782 Mixed hyperlipidemia: Secondary | ICD-10-CM | POA: Diagnosis not present

## 2014-05-18 ENCOUNTER — Other Ambulatory Visit: Payer: Self-pay | Admitting: Dermatology

## 2014-05-18 DIAGNOSIS — B078 Other viral warts: Secondary | ICD-10-CM | POA: Diagnosis not present

## 2014-05-18 DIAGNOSIS — Z0000 Encounter for general adult medical examination without abnormal findings: Secondary | ICD-10-CM | POA: Diagnosis not present

## 2014-05-18 DIAGNOSIS — L821 Other seborrheic keratosis: Secondary | ICD-10-CM | POA: Diagnosis not present

## 2014-05-18 DIAGNOSIS — D0462 Carcinoma in situ of skin of left upper limb, including shoulder: Secondary | ICD-10-CM | POA: Diagnosis not present

## 2014-05-18 DIAGNOSIS — Z125 Encounter for screening for malignant neoplasm of prostate: Secondary | ICD-10-CM | POA: Diagnosis not present

## 2014-05-18 DIAGNOSIS — D485 Neoplasm of uncertain behavior of skin: Secondary | ICD-10-CM | POA: Diagnosis not present

## 2014-05-18 DIAGNOSIS — L570 Actinic keratosis: Secondary | ICD-10-CM | POA: Diagnosis not present

## 2014-06-24 ENCOUNTER — Encounter: Payer: Self-pay | Admitting: Gastroenterology

## 2014-10-31 DIAGNOSIS — L820 Inflamed seborrheic keratosis: Secondary | ICD-10-CM | POA: Diagnosis not present

## 2014-10-31 DIAGNOSIS — D225 Melanocytic nevi of trunk: Secondary | ICD-10-CM | POA: Diagnosis not present

## 2014-10-31 DIAGNOSIS — L814 Other melanin hyperpigmentation: Secondary | ICD-10-CM | POA: Diagnosis not present

## 2014-10-31 DIAGNOSIS — L821 Other seborrheic keratosis: Secondary | ICD-10-CM | POA: Diagnosis not present

## 2014-10-31 DIAGNOSIS — L570 Actinic keratosis: Secondary | ICD-10-CM | POA: Diagnosis not present

## 2015-02-03 DIAGNOSIS — Z0000 Encounter for general adult medical examination without abnormal findings: Secondary | ICD-10-CM | POA: Diagnosis not present

## 2015-02-03 DIAGNOSIS — Z024 Encounter for examination for driving license: Secondary | ICD-10-CM | POA: Diagnosis not present

## 2015-03-15 DIAGNOSIS — J3489 Other specified disorders of nose and nasal sinuses: Secondary | ICD-10-CM | POA: Diagnosis not present

## 2015-03-31 DIAGNOSIS — Z1389 Encounter for screening for other disorder: Secondary | ICD-10-CM | POA: Diagnosis not present

## 2015-03-31 DIAGNOSIS — F325 Major depressive disorder, single episode, in full remission: Secondary | ICD-10-CM | POA: Diagnosis not present

## 2015-03-31 DIAGNOSIS — M5136 Other intervertebral disc degeneration, lumbar region: Secondary | ICD-10-CM | POA: Diagnosis not present

## 2015-03-31 DIAGNOSIS — I10 Essential (primary) hypertension: Secondary | ICD-10-CM | POA: Diagnosis not present

## 2015-03-31 DIAGNOSIS — R739 Hyperglycemia, unspecified: Secondary | ICD-10-CM | POA: Diagnosis not present

## 2015-03-31 DIAGNOSIS — Z0000 Encounter for general adult medical examination without abnormal findings: Secondary | ICD-10-CM | POA: Diagnosis not present

## 2015-03-31 DIAGNOSIS — E782 Mixed hyperlipidemia: Secondary | ICD-10-CM | POA: Diagnosis not present

## 2015-04-05 DIAGNOSIS — R7301 Impaired fasting glucose: Secondary | ICD-10-CM | POA: Diagnosis not present

## 2015-05-10 DIAGNOSIS — L570 Actinic keratosis: Secondary | ICD-10-CM | POA: Diagnosis not present

## 2015-05-10 DIAGNOSIS — L820 Inflamed seborrheic keratosis: Secondary | ICD-10-CM | POA: Diagnosis not present

## 2015-05-30 DIAGNOSIS — H2513 Age-related nuclear cataract, bilateral: Secondary | ICD-10-CM | POA: Diagnosis not present

## 2015-05-30 DIAGNOSIS — H26492 Other secondary cataract, left eye: Secondary | ICD-10-CM | POA: Diagnosis not present

## 2015-10-16 DIAGNOSIS — I10 Essential (primary) hypertension: Secondary | ICD-10-CM | POA: Diagnosis not present

## 2015-10-30 ENCOUNTER — Telehealth: Payer: Self-pay | Admitting: Cardiology

## 2015-10-30 ENCOUNTER — Encounter (HOSPITAL_COMMUNITY): Payer: Self-pay | Admitting: *Deleted

## 2015-10-30 ENCOUNTER — Observation Stay (HOSPITAL_COMMUNITY)
Admission: EM | Admit: 2015-10-30 | Discharge: 2015-10-31 | Disposition: A | Discharge status: Home / Self Care | Payer: Medicare Other | Attending: Internal Medicine | Admitting: Internal Medicine

## 2015-10-30 ENCOUNTER — Emergency Department (HOSPITAL_COMMUNITY): Payer: Medicare Other

## 2015-10-30 DIAGNOSIS — R0602 Shortness of breath: Secondary | ICD-10-CM | POA: Diagnosis not present

## 2015-10-30 DIAGNOSIS — I25119 Atherosclerotic heart disease of native coronary artery with unspecified angina pectoris: Secondary | ICD-10-CM

## 2015-10-30 DIAGNOSIS — R079 Chest pain, unspecified: Secondary | ICD-10-CM | POA: Diagnosis not present

## 2015-10-30 DIAGNOSIS — Z955 Presence of coronary angioplasty implant and graft: Secondary | ICD-10-CM | POA: Diagnosis not present

## 2015-10-30 DIAGNOSIS — I10 Essential (primary) hypertension: Secondary | ICD-10-CM | POA: Diagnosis not present

## 2015-10-30 DIAGNOSIS — I2510 Atherosclerotic heart disease of native coronary artery without angina pectoris: Secondary | ICD-10-CM | POA: Diagnosis not present

## 2015-10-30 DIAGNOSIS — K219 Gastro-esophageal reflux disease without esophagitis: Secondary | ICD-10-CM | POA: Diagnosis present

## 2015-10-30 DIAGNOSIS — R0789 Other chest pain: Principal | ICD-10-CM | POA: Insufficient documentation

## 2015-10-30 DIAGNOSIS — Z79899 Other long term (current) drug therapy: Secondary | ICD-10-CM | POA: Diagnosis not present

## 2015-10-30 DIAGNOSIS — E785 Hyperlipidemia, unspecified: Secondary | ICD-10-CM | POA: Diagnosis not present

## 2015-10-30 DIAGNOSIS — Z7982 Long term (current) use of aspirin: Secondary | ICD-10-CM | POA: Insufficient documentation

## 2015-10-30 DIAGNOSIS — R001 Bradycardia, unspecified: Secondary | ICD-10-CM | POA: Diagnosis present

## 2015-10-30 DIAGNOSIS — R110 Nausea: Secondary | ICD-10-CM | POA: Diagnosis not present

## 2015-10-30 DIAGNOSIS — K210 Gastro-esophageal reflux disease with esophagitis: Secondary | ICD-10-CM

## 2015-10-30 DIAGNOSIS — D696 Thrombocytopenia, unspecified: Secondary | ICD-10-CM | POA: Diagnosis not present

## 2015-10-30 LAB — BASIC METABOLIC PANEL
Anion gap: 4 — ABNORMAL LOW (ref 5–15)
BUN: 11 mg/dL (ref 6–20)
CO2: 31 mmol/L (ref 22–32)
Calcium: 9 mg/dL (ref 8.9–10.3)
Chloride: 101 mmol/L (ref 101–111)
Creatinine, Ser: 1.09 mg/dL (ref 0.61–1.24)
GFR calc Af Amer: >60 mL/min (ref >60)
GFR calc non Af Amer: >60 mL/min (ref >60)
Glucose, Bld: 113 mg/dL — ABNORMAL HIGH (ref 65–99)
Potassium: 4 mmol/L (ref 3.5–5.1)
Sodium: 136 mmol/L (ref 135–145)

## 2015-10-30 LAB — CBC
HCT: 44.2 % (ref 39.0–52.0)
Hemoglobin: 15.5 g/dL (ref 13.0–17.0)
MCH: 32.1 pg (ref 26.0–34.0)
MCHC: 35.1 g/dL (ref 30.0–36.0)
MCV: 91.5 fL (ref 78.0–100.0)
Platelets: 122 10*3/uL — ABNORMAL LOW (ref 150–400)
RBC: 4.83 MIL/uL (ref 4.22–5.81)
RDW: 12.9 % (ref 11.5–15.5)
WBC: 6.2 10*3/uL (ref 4.0–10.5)

## 2015-10-30 LAB — I-STAT TROPONIN, ED: Troponin i, poc: 0 ng/mL (ref 0.00–0.08)

## 2015-10-30 MED ORDER — FINASTERIDE 1 MG PO TABS: 1.0000 mg | ORAL_TABLET | Freq: Every day | ORAL | Status: DC

## 2015-10-30 MED ORDER — ASPIRIN EC 81 MG PO TBEC
81.0000 mg | DELAYED_RELEASE_TABLET | Freq: Every day | ORAL | Status: DC
  Administered 2015-10-31: 81 mg via ORAL
  Filled 2015-10-30: qty 1

## 2015-10-30 MED ORDER — ACETAMINOPHEN 325 MG PO TABS: 650.0000 mg | ORAL_TABLET | ORAL | Status: DC | PRN

## 2015-10-30 MED ORDER — FAMOTIDINE IN NACL 20-0.9 MG/50ML-% IV SOLN
20.0000 mg | Freq: Two times a day (BID) | INTRAVENOUS | Status: DC
  Administered 2015-10-31 (×2): 20 mg via INTRAVENOUS
  Filled 2015-10-30 (×3): qty 50

## 2015-10-30 MED ORDER — CO Q 10 100 MG PO CAPS: 200.0000 mg | ORAL_CAPSULE | Freq: Every day | ORAL | Status: DC

## 2015-10-30 MED ORDER — NITROGLYCERIN 0.4 MG SL SUBL: 0.4000 mg | SUBLINGUAL_TABLET | SUBLINGUAL | Status: DC | PRN

## 2015-10-30 MED ORDER — VALSARTAN-HYDROCHLOROTHIAZIDE 80-12.5 MG PO TABS: 1.0000 | ORAL_TABLET | Freq: Every morning | ORAL | Status: DC

## 2015-10-30 MED ORDER — IRBESARTAN 150 MG PO TABS
75.0000 mg | ORAL_TABLET | Freq: Every day | ORAL | Status: DC
  Administered 2015-10-31: 75 mg via ORAL
  Filled 2015-10-30: qty 1

## 2015-10-30 MED ORDER — ATORVASTATIN CALCIUM 40 MG PO TABS
40.0000 mg | ORAL_TABLET | Freq: Every day | ORAL | Status: DC
  Administered 2015-10-30: 40 mg via ORAL
  Filled 2015-10-30: qty 1

## 2015-10-30 MED ORDER — ONDANSETRON HCL 4 MG/2ML IJ SOLN: 4.0000 mg | Freq: Four times a day (QID) | INTRAMUSCULAR | Status: DC | PRN

## 2015-10-30 MED ORDER — GABAPENTIN 300 MG PO CAPS
600.0000 mg | ORAL_CAPSULE | Freq: Every day | ORAL | Status: DC
  Administered 2015-10-30: 600 mg via ORAL
  Filled 2015-10-30: qty 2

## 2015-10-30 MED ORDER — HYDROCHLOROTHIAZIDE 12.5 MG PO CAPS
12.5000 mg | ORAL_CAPSULE | Freq: Every day | ORAL | Status: DC
  Administered 2015-10-31: 12.5 mg via ORAL
  Filled 2015-10-30: qty 1

## 2015-10-30 MED ORDER — HYDROCODONE-ACETAMINOPHEN 5-325 MG PO TABS: 1.0000 | ORAL_TABLET | Freq: Four times a day (QID) | ORAL | Status: DC | PRN

## 2015-10-30 MED ORDER — ENOXAPARIN SODIUM 40 MG/0.4ML ~~LOC~~ SOLN
40.0000 mg | Freq: Every day | SUBCUTANEOUS | Status: DC
  Administered 2015-10-30: 40 mg via SUBCUTANEOUS
  Filled 2015-10-30: qty 0.4

## 2015-10-30 MED ORDER — CITALOPRAM HYDROBROMIDE 20 MG PO TABS
20.0000 mg | ORAL_TABLET | Freq: Every day | ORAL | Status: DC
  Administered 2015-10-31: 20 mg via ORAL
  Filled 2015-10-30: qty 1

## 2015-10-30 NOTE — Telephone Encounter (Signed)
New Message  Pt c/o of Chest Pain: STAT if CP now or developed within 24 hours  1. Are you having CP right now? Yes  2. Are you experiencing any other symptoms (ex. SOB, nausea, vomiting, sweating)? Little SOB  3. How long have you been experiencing CP? All weekend  4. Is your CP continuous or coming and going? Continuous  5. Have you taken Nitroglycerin? No, but pt asked wife where it is.  Pt having pain left center of chest ?

## 2015-10-30 NOTE — H&P (Signed)
History and Physical    [name] [id number] DOB: Dec 04, 1950 DOA: 10/30/2015  PCP: Irven Shelling, MD   Patient coming from: Home  Chief Complaint: Chest pain  HPI: [name] is a 65 y.o. male with medical history significant for hypertension, GERD, and CAD with stent to LAD in 2010, now presenting to the emergency department for evaluation of exertional chest pain for the past 4 days. The patient reports that he had been in his usual state of health until approximately 4 days ago when he noted a dull ache in the central chest with radiation to the left shoulder and upper arm. These symptoms occurred while physically active and resolved over the course of several minutes with rest. He went on to have recurrent episodes of this, with symptoms becoming more frequent and severe, prompting his presentation. Patient takes a daily aspirin and reports taking 4 baby aspirins prior to arrival. He has nitroglycerin at home but has not taken it for any of these episodes. Since the stent was placed in 2010, he has not had a repeat catheterization, stress testing, or echocardiogram performed. He had been doing quite well with no anginal complaints up until 4 days ago. Current symptoms are different than those he experienced prior to the stent placement. In 2010, he was having significant dyspnea with exertion, but that is not the case currently.  ED Course: Upon arrival to the ED, patient is found to be afebrile, saturating well on room air, bradycardic in the low 40s, and with vitals otherwise stable. EKG demonstrates sinus bradycardia with rate 45 and is otherwise normal. Chest x-ray features a mild elevation in the left hemidiaphragm but is negative for acute cardiopulmonary disease. BMP is unremarkable and CBC is notable only for a stable chronic thrombocytopenia. Troponin is undetectable. ED physician discussed the case with cardiology who felt that a medical admission would be more  appropriate. Patient has remained hemodynamically stable in the ED with no anginal complaints since his arrival. He'll be observed on the telemetry unit for ongoing evaluation and management of exertional chest pain in a patient with known CAD.  Review of Systems:  All other systems reviewed and apart from HPI, are negative.  Past Medical History:  Diagnosis Date  . Anxiety   . CAD (coronary artery disease)   . Coronary atherosclerosis of native coronary artery 01/29/2013  . Hx of seasonal allergies   . Hyperlipidemia   . Hypertension     Past Surgical History:  Procedure Laterality Date  . ANGIOPLASTY  2010   stent  . LUMBAR LAMINECTOMY  1991   L3 and T12     reports that he has never smoked. He has never used smokeless tobacco. He reports that he drinks alcohol. He reports that he does not use drugs.  Allergies  Allergen Reactions  . Chicken Allergy Shortness Of Breath, Nausea And Vomiting and Other (See Comments)    Headache SAME REACTION(S) TO [place]!!!!  . Morphine Shortness Of Breath  . Other Anaphylaxis    Ate a fruit salad and had to go to the E.R.  . Pineapple Anaphylaxis and Rash  . Naproxen Rash    Family History  Problem Relation Age of Onset  . Colon cancer Neg Hx   . Stomach cancer Neg Hx      Prior to Admission medications   Medication Sig Start Date End Date Taking? Authorizing Provider  acetaminophen (TYLENOL) 500 MG tablet Take 500 mg by mouth at bedtime.  Yes Historical Provider, MD  aspirin EC 81 MG tablet Take 81 mg by mouth daily.   Yes Historical Provider, MD  atorvastatin (LIPITOR) 40 MG tablet Take 40 mg by mouth at bedtime.   Yes Historical Provider, MD  citalopram (CELEXA) 20 MG tablet Take 20 mg by mouth daily.  04/24/11  Yes Historical Provider, MD  Coenzyme Q10 (CO Q 10) 100 MG CAPS Take 200 mg by mouth daily.   Yes Historical Provider, MD  finasteride (PROPECIA) 1 MG tablet Take 1 mg by mouth daily. 09/14/15  Yes Historical Provider, MD    fluorouracil (EFUDEX) 5 % cream Apply topically as needed.  02/25/11  Yes Historical Provider, MD  gabapentin (NEURONTIN) 300 MG capsule Take 600 mg by mouth at bedtime. And may take an additional 300 mg once a day if needed   Yes Historical Provider, MD  metoprolol tartrate (LOPRESSOR) 25 MG tablet Take 25 mg by mouth daily.  04/30/11  Yes Historical Provider, MD  Papaya CHEW Chew 1 tablet by mouth 2 (two) times daily as needed (for indigestion).   Yes Historical Provider, MD  Saw Palmetto 450 MG CAPS Take 1 capsule by mouth daily.   Yes Historical Provider, MD  traMADol (ULTRAM) 50 MG tablet Take 50 mg by mouth daily as needed (for pain).  10/17/15  Yes Historical Provider, MD  valsartan-hydrochlorothiazide (DIOVAN-HCT) 80-12.5 MG tablet Take 1 tablet by mouth every morning.   Yes Historical Provider, MD  valsartan-hydrochlorothiazide (DIOVAN-HCT) 80-12.5 MG per tablet Take 1 tablet by mouth daily.  05/19/11   Historical Provider, MD    Physical Exam: Vitals:   10/30/15 2045 10/30/15 2115 10/30/15 2145 10/30/15 2230  BP: 152/83 155/78 135/80 133/74  Pulse: (!) 42 (!) 44 (!) 42   Resp: 18 18 21 12   Temp:      TempSrc:      SpO2: 98% 97% 95% 94%  Weight:      Height:          Constitutional: NAD, calm, comfortable Eyes: PERTLA, lids and conjunctivae normal ENMT: Mucous membranes are moist. Posterior pharynx clear of any exudate or lesions.   Neck: normal, supple, no masses, no thyromegaly Respiratory: clear to auscultation bilaterally, no wheezing, no crackles. Normal respiratory effort.   Cardiovascular: Rate ~45 and regular without significant murmur. No extremity edema. 2+ pedal pulses. No carotid bruits. No significant JVD. Abdomen: No distension, no tenderness, no masses palpated. Bowel sounds normal.  Musculoskeletal: no clubbing / cyanosis. No joint deformity upper and lower extremities. Normal muscle tone.  Skin: no significant rashes, lesions, ulcers. Warm, dry,  well-perfused. Neurologic: CN 2-12 grossly intact. Sensation intact, DTR normal. Strength 5/5 in all 4 limbs.  Psychiatric: Normal judgment and insight. Alert and oriented x 3. Normal mood and affect.     Labs on Admission: I have personally reviewed following labs and imaging studies  CBC:  Recent Labs Lab 10/30/15 1553  WBC 6.2  HGB 15.5  HCT 44.2  MCV 91.5  PLT [id number]*   Basic Metabolic Panel:  Recent Labs Lab 10/30/15 1553  NA 136  K 4.0  CL 101  CO2 31  GLUCOSE 113*  BUN 11  CREATININE 1.09  CALCIUM 9.0   GFR: Estimated Creatinine Clearance: 70 mL/min (by C-G formula based on SCr of 1.09 mg/dL). Liver Function Tests: No results for input(s): AST, ALT, ALKPHOS, BILITOT, PROT, ALBUMIN in the last 168 hours. No results for input(s): LIPASE, AMYLASE in the last 168 hours. No results for  input(s): AMMONIA in the last 168 hours. Coagulation Profile: No results for input(s): INR, PROTIME in the last 168 hours. Cardiac Enzymes: No results for input(s): CKTOTAL, CKMB, CKMBINDEX, TROPONINI in the last 168 hours. BNP (last 3 results) No results for input(s): PROBNP in the last 8760 hours. HbA1C: No results for input(s): HGBA1C in the last 72 hours. CBG: No results for input(s): GLUCAP in the last 168 hours. Lipid Profile: No results for input(s): CHOL, HDL, LDLCALC, TRIG, CHOLHDL, LDLDIRECT in the last 72 hours. Thyroid Function Tests: No results for input(s): TSH, T4TOTAL, FREET4, T3FREE, THYROIDAB in the last 72 hours. Anemia Panel: No results for input(s): VITAMINB12, FOLATE, FERRITIN, TIBC, IRON, RETICCTPCT in the last 72 hours. Urine analysis: No results found for: COLORURINE, APPEARANCEUR, LABSPEC, PHURINE, GLUCOSEU, HGBUR, BILIRUBINUR, KETONESUR, PROTEINUR, UROBILINOGEN, NITRITE, LEUKOCYTESUR Sepsis Labs: @LABRCNTIP (procalcitonin:4,lacticidven:4) )No results found for this or any previous visit (from the past 240 hour(s)).   Radiological Exams on  Admission: Dg Chest 2 View  Result Date: 10/30/2015 CLINICAL DATA:  Chest pain EXAM: CHEST  2 VIEW COMPARISON:  None. FINDINGS: Normal heart size. Normal mediastinal contour. No pneumothorax. No pleural effusion. Lungs appear clear, with no acute consolidative airspace disease and no pulmonary edema. Mild elevation of the left hemidiaphragm. Partially visualized bilateral posterior spinal fusion hardware in the lower thoracic/ upper lumbar spine. IMPRESSION: Mild elevation of the left hemidiaphragm. Otherwise no active disease in the chest. Electronically Signed   By: Ilona Sorrel M.D.   On: 10/30/2015 17:13    EKG: Independently reviewed. Sinus bradycardia (rate 45), otherwise normal EKG  Assessment/Plan  1. Chest pain  - Exertional central CP with radiation to left shoulder and mild SOB - Initial EKG notable for bradycardia only, no acute ischemic features - Initial troponin 0.00  - ASA 324 mg taken pta, continue Lipitor, hold beta-blocker in setting of bradycardia  - Monitor on telemetry for ischemic changes, obtain serial troponin measurements and EKG's  - If remains asymptomatic overnight and troponin remains negative, could consider stress-testing in the am   2. CAD  - Hx of DES to LAD in May 2010 - Evaluating for ACS as above  - Continue daily ASA 81 and Lipitor  - Hold Lopressor d/t bradycardia, consider resuming as HR allows    3. Bradycardia  - Rates staying in low-mid 40s without sxs at rest  - Ruling-out ACS as above  - Likely secondary to beta-blocker, currently held    4. Hypertension - At goal currently  - Continue valsartan-HCTZ, hold metoprolol as above    5. GERD - Esophagitis noted on EGD from 2008; path from biopsies without metaplasia, dysplasia, or malignancy  - Previously on PPI, no longer on any acid-suppressors  - Given the exertional nature of the CP, doubt GI etiology, but will treat empirically with BID Pepcid for now   6. Thrombocytopenia - Platelet  count 122,000 and stable relative to prior CBC  - No suggestion of active blood loss on admission     DVT prophylaxis: sq Lovenox  Code Status: Full  Family Communication: Wife updated at bedside Disposition Plan: Observe on telemetry Consults called: None Admission status: Observation    Vianne Bulls, MD [facility] Pager [phone]  If 7PM-7AM, please contact night-coverage [email or web address] Password [facility]  10/30/2015, 10:52 PM

## 2015-10-30 NOTE — ED Notes (Signed)
Pt updated about wait time.  

## 2015-10-30 NOTE — ED Triage Notes (Addendum)
Pt states L sided chest pain and nausea x 5 days.  Pain increased today so he decided to come to ED.  Pt fell on tool box several weeks ago in same spot.

## 2015-10-30 NOTE — ED Provider Notes (Signed)
[facility] DEPT Provider Note   CSN: [id number] Arrival date & time: 10/30/15  1539     History   Chief Complaint Chief Complaint  Patient presents with  . Chest Pain    HPI [name] is a 65 y.o. male.   Chest Pain   This is a new problem. The current episode started more than 2 days ago. The problem occurs daily. The problem has not changed since onset.The pain is associated with exertion. The pain is present in the substernal region. The pain is at a severity of 5/10. The pain is mild. The quality of the pain is described as dull and exertional. The pain radiates to the left arm and left shoulder. The symptoms are aggravated by exertion. Associated symptoms include diaphoresis and nausea. Pertinent negatives include no abdominal pain, no cough and no shortness of breath. He has tried rest for the symptoms. The treatment provided moderate relief.    Past Medical History:  Diagnosis Date  . Anxiety   . CAD (coronary artery disease)   . Coronary atherosclerosis of native coronary artery 01/29/2013  . Hx of seasonal allergies   . Hyperlipidemia   . Hypertension     Patient Active Problem List   Diagnosis Date Noted  . Chest pain 10/30/2015  . Thrombocytopenia ([place]) 10/30/2015  . Bradycardia on ECG 10/30/2015  . CAD (coronary artery disease), native coronary artery 01/29/2013  . Pure hypercholesterolemia 01/29/2013  . COLONIC POLYPS, ADENOMATOUS 03/14/2007  . Essential hypertension 03/14/2007  . GERD (gastroesophageal reflux disease) 03/14/2007  . HIATAL HERNIA 03/14/2007  . NEPHROLITHIASIS 03/14/2007  . BACKACHE NOS 06/03/2006  . HEMORRHOIDS 11/18/2000    Past Surgical History:  Procedure Laterality Date  . ANGIOPLASTY  2010   stent  . LUMBAR LAMINECTOMY  1991   L3 and T12       Home Medications    Prior to Admission medications   Medication Sig Start Date End Date Taking? Authorizing Provider  acetaminophen (TYLENOL) 500 MG tablet Take 500  mg by mouth at bedtime.   Yes Historical Provider, MD  aspirin EC 81 MG tablet Take 81 mg by mouth daily.   Yes Historical Provider, MD  atorvastatin (LIPITOR) 40 MG tablet Take 40 mg by mouth at bedtime.   Yes Historical Provider, MD  citalopram (CELEXA) 20 MG tablet Take 20 mg by mouth daily.  04/24/11  Yes Historical Provider, MD  Coenzyme Q10 (CO Q 10) 100 MG CAPS Take 200 mg by mouth daily.   Yes Historical Provider, MD  finasteride (PROPECIA) 1 MG tablet Take 1 mg by mouth daily. 09/14/15  Yes Historical Provider, MD  fluorouracil (EFUDEX) 5 % cream Apply topically as needed.  02/25/11  Yes Historical Provider, MD  gabapentin (NEURONTIN) 300 MG capsule Take 600 mg by mouth at bedtime. And may take an additional 300 mg once a day if needed   Yes Historical Provider, MD  metoprolol tartrate (LOPRESSOR) 25 MG tablet Take 25 mg by mouth daily.  04/30/11  Yes Historical Provider, MD  Papaya CHEW Chew 1 tablet by mouth 2 (two) times daily as needed (for indigestion).   Yes Historical Provider, MD  Saw Palmetto 450 MG CAPS Take 1 capsule by mouth daily.   Yes Historical Provider, MD  traMADol (ULTRAM) 50 MG tablet Take 50 mg by mouth daily as needed (for pain).  10/17/15  Yes Historical Provider, MD  valsartan-hydrochlorothiazide (DIOVAN-HCT) 80-12.5 MG tablet Take 1 tablet by mouth every morning.   Yes  Historical Provider, MD  valsartan-hydrochlorothiazide (DIOVAN-HCT) 80-12.5 MG per tablet Take 1 tablet by mouth daily.  05/19/11   Historical Provider, MD    Family History Family History  Problem Relation Age of Onset  . Colon cancer Neg Hx   . Stomach cancer Neg Hx     Social History Social History  Substance Use Topics  . Smoking status: Never Smoker  . Smokeless tobacco: Never Used  . Alcohol use Yes     Comment: rare     Allergies   Chicken allergy; Morphine; Other; Pineapple; and Naproxen   Review of Systems Review of Systems  Constitutional: Positive for diaphoresis.  HENT:  Negative for congestion.   Respiratory: Negative for cough and shortness of breath.   Cardiovascular: Positive for chest pain.  Gastrointestinal: Positive for nausea. Negative for abdominal pain.  Endocrine: Negative for polydipsia and polyuria.  Skin: Negative for pallor.  All other systems reviewed and are negative.    Physical Exam Updated Vital Signs BP 135/71 (BP Location: Left Arm)   Pulse (!) 44   Temp 97.7 F (36.5 C) (Oral)   Resp 16   Ht 5\' 11"  (1.803 m)   Wt 195 lb 8 oz (88.7 kg)   SpO2 99%   BMI 27.27 kg/m   Physical Exam  Constitutional: He appears well-developed and well-nourished.  HENT:  Head: Normocephalic and atraumatic.  Eyes: Conjunctivae are normal.  Neck: Neck supple.  Cardiovascular: Regular rhythm.  Bradycardia present.   No murmur heard. Pulmonary/Chest: Effort normal and breath sounds normal. No respiratory distress.  Abdominal: Soft. There is no tenderness.  Musculoskeletal: He exhibits no edema.  Neurological: He is alert.  Skin: Skin is warm and dry.  Psychiatric: He has a normal mood and affect.  Nursing note and vitals reviewed.    ED Treatments / Results  Labs (all labs ordered are listed, but only abnormal results are displayed) Labs Reviewed  BASIC METABOLIC PANEL - Abnormal; Notable for the following:       Result Value   Glucose, Bld 113 (*)    Anion gap 4 (*)    All other components within normal limits  CBC - Abnormal; Notable for the following:    Platelets 122 (*)    All other components within normal limits  TROPONIN I  TSH  TROPONIN I  TROPONIN I  I-STAT TROPOININ, ED    EKG  EKG Interpretation  Date/Time:  Monday October 30 2015 15:51:13 EDT Ventricular Rate:  45 PR Interval:  166 QRS Duration: 110 QT Interval:  434 QTC Calculation: 375 R Axis:   27 Text Interpretation:  Sinus bradycardia Otherwise normal ECG When compared with ECG of 07/13/2008, No significant change was found Confirmed by [facility]  MD,  DAVID ([id number]) on 10/30/2015 3:56:58 PM       Radiology Dg Chest 2 View  Result Date: 10/30/2015 CLINICAL DATA:  Chest pain EXAM: CHEST  2 VIEW COMPARISON:  None. FINDINGS: Normal heart size. Normal mediastinal contour. No pneumothorax. No pleural effusion. Lungs appear clear, with no acute consolidative airspace disease and no pulmonary edema. Mild elevation of the left hemidiaphragm. Partially visualized bilateral posterior spinal fusion hardware in the lower thoracic/ upper lumbar spine. IMPRESSION: Mild elevation of the left hemidiaphragm. Otherwise no active disease in the chest. Electronically Signed   By: Ilona Sorrel M.D.   On: 10/30/2015 17:13    Procedures Procedures (including critical care time)  Medications Ordered in ED Medications  aspirin EC tablet 81 mg (  not administered)  atorvastatin (LIPITOR) tablet 40 mg (40 mg Oral Given 10/30/15 2350)  gabapentin (NEURONTIN) capsule 600 mg (600 mg Oral Given 10/30/15 2350)  citalopram (CELEXA) tablet 20 mg (not administered)  nitroGLYCERIN (NITROSTAT) SL tablet 0.4 mg (not administered)  acetaminophen (TYLENOL) tablet 650 mg (not administered)  ondansetron (ZOFRAN) injection 4 mg (not administered)  enoxaparin (LOVENOX) injection 40 mg (40 mg Subcutaneous Given 10/30/15 2350)  HYDROcodone-acetaminophen (NORCO/VICODIN) 5-325 MG per tablet 1-2 tablet (not administered)  irbesartan (AVAPRO) tablet 75 mg (not administered)    And  hydrochlorothiazide (MICROZIDE) capsule 12.5 mg (not administered)  famotidine (PEPCID) IVPB 20 mg premix (20 mg Intravenous Given 10/31/15 0034)     Initial Impression / Assessment and Plan / ED Course  I have reviewed the triage vital signs and the nursing notes.  Pertinent labs & imaging results that were available during my care of the patient were reviewed by me and considered in my medical decision making (see chart for details).  Clinical Course    65 yo M w/ left chest pressure and achiness  radiating to left arm worse with exertion, better with rest. ecg ok. Labs ok.  D/w cardiology about admission for stress testing, recommends medicine admission.  D/w medicine and will admit for same.   Final Clinical Impressions(s) / ED Diagnoses   Final diagnoses:  Chest pain, unspecified chest pain type    New Prescriptions Current Discharge Medication List       Merrily Pew, MD 10/31/15 [phone]

## 2015-10-30 NOTE — Telephone Encounter (Signed)
I spoke with the pt and he complains of a deep throbbing pain in his left chest and left arm numbness.  The pt's symptoms started last Friday and have continued to come and go.  The pt has experienced symptoms today and is having pain at this time.  The pt denies SOB but has had episodes of nausea.  The pt has not used NTG for symptoms.  I advised the pt to proceed to the [facility] ER at this time.  The pt will have his wife drive him and I advised that he contact EMS for transportation if symptoms worsen.  Pt agreed with plan.

## 2015-10-30 NOTE — Telephone Encounter (Signed)
I spoke with the pt's wife and the pt currently is not available for me to speak with at this time.  Per the pt's wife the pt is complaining of pain in the center of chest and left arm pain that is coming and going and started over the weekend.  The pt has had pain today. I made her aware that the pt needs to proceed to the ER for evaluation.  She did give me the pt's cell [phone] to call but I got his voicemail and left a message for him to contact the office. The pt's wife would like me to call her back at 2:30 at [phone] to speak with the pt.

## 2015-10-31 ENCOUNTER — Observation Stay (HOSPITAL_COMMUNITY): Payer: Medicare Other

## 2015-10-31 ENCOUNTER — Observation Stay (HOSPITAL_BASED_OUTPATIENT_CLINIC_OR_DEPARTMENT_OTHER): Payer: Medicare Other

## 2015-10-31 DIAGNOSIS — I25118 Atherosclerotic heart disease of native coronary artery with other forms of angina pectoris: Secondary | ICD-10-CM | POA: Diagnosis not present

## 2015-10-31 DIAGNOSIS — R0789 Other chest pain: Secondary | ICD-10-CM | POA: Diagnosis not present

## 2015-10-31 DIAGNOSIS — R001 Bradycardia, unspecified: Secondary | ICD-10-CM | POA: Diagnosis not present

## 2015-10-31 DIAGNOSIS — M79602 Pain in left arm: Secondary | ICD-10-CM | POA: Diagnosis not present

## 2015-10-31 DIAGNOSIS — I10 Essential (primary) hypertension: Secondary | ICD-10-CM

## 2015-10-31 DIAGNOSIS — R079 Chest pain, unspecified: Secondary | ICD-10-CM

## 2015-10-31 DIAGNOSIS — I2510 Atherosclerotic heart disease of native coronary artery without angina pectoris: Secondary | ICD-10-CM

## 2015-10-31 LAB — NM MYOCAR MULTI W/SPECT W/WALL MOTION / EF
Estimated workload: 1 METS
LV dias vol: 91 mL (ref 62–150)
LV sys vol: 40 mL
MPHR: 153 {beats}/min
Peak HR: 70 {beats}/min
Percent HR: 45 %
RATE: 0.24
Rest HR: 45 {beats}/min
SDS: 3
SRS: 6
SSS: 9
TID: 1.24

## 2015-10-31 LAB — LIPID PANEL
Cholesterol: 150 mg/dL (ref 0–200)
HDL: 40 mg/dL — ABNORMAL LOW (ref >40)
LDL Cholesterol: 70 mg/dL (ref 0–99)
Total CHOL/HDL Ratio: 3.8 RATIO
Triglycerides: 201 mg/dL — ABNORMAL HIGH (ref <150)
VLDL: 40 mg/dL (ref 0–40)

## 2015-10-31 LAB — TROPONIN I
Troponin I: <0.03 ng/mL (ref <0.03)
Troponin I: <0.03 ng/mL (ref <0.03)
Troponin I: <0.03 ng/mL (ref <0.03)

## 2015-10-31 LAB — TSH: TSH: 1.617 u[IU]/mL (ref 0.350–4.500)

## 2015-10-31 MED ORDER — REGADENOSON 0.4 MG/5ML IV SOLN
0.4000 mg | Freq: Once | INTRAVENOUS | Status: DC
  Filled 2015-10-31: qty 5

## 2015-10-31 MED ORDER — REGADENOSON 0.4 MG/5ML IV SOLN
INTRAVENOUS | Status: AC
  Filled 2015-10-31: qty 5

## 2015-10-31 MED ORDER — NITROGLYCERIN 0.4 MG SL SUBL: 0.4000 mg | SUBLINGUAL_TABLET | SUBLINGUAL | 0 refills | Status: DC | PRN

## 2015-10-31 MED ORDER — TECHNETIUM TC 99M TETROFOSMIN IV KIT
10.0000 | PACK | Freq: Once | INTRAVENOUS | Status: AC | PRN
  Administered 2015-10-31: 10 via INTRAVENOUS

## 2015-10-31 MED ORDER — FAMOTIDINE 20 MG PO TABS: 20.0000 mg | ORAL_TABLET | Freq: Two times a day (BID) | ORAL | Status: DC

## 2015-10-31 MED ORDER — REGADENOSON 0.4 MG/5ML IV SOLN
0.4000 mg | Freq: Once | INTRAVENOUS | Status: AC
Start: 2015-10-31 — End: 2015-10-31
  Administered 2015-10-31: 0.4 mg via INTRAVENOUS

## 2015-10-31 MED ORDER — TECHNETIUM TC 99M TETROFOSMIN IV KIT
30.0000 | PACK | Freq: Once | INTRAVENOUS | Status: AC | PRN
  Administered 2015-10-31: 30 via INTRAVENOUS

## 2015-10-31 NOTE — Progress Notes (Signed)
Patient given discharge instructions medication list, follow up appointment. IV and tele were dcd will discharge home as ordered. [name], Bettina Gavia RN

## 2015-10-31 NOTE — Consult Note (Signed)
CARDIOLOGY CONSULT NOTE   Patient ID: [name] MRN: [id number] DOB/AGE: [birth year] 67 y.o.  Admit date: 10/30/2015  Requesting Physician: Dr. Erlinda Johns Primary Physician:   Austin Shelling, MD Primary Cardiologist:  Dr. Marlou Johns Reason for Consultation: Chest pain   HPI: [name] is a 65 y.o. male with a history of HTN, HLD, GERD, sinus bradycardia and CAD s/p PCI/DES to LAD in 2010 who presented to [facility] on 10/30/15 with chest pain.   He presented with chest pain in 2010. He went to the cath lab on Jul 12, 2008, and was found to have LAD with a proximal 95% lesion s/p PCI/DES50% (full cath report below). There were some mild hypokinesis of the anterior wall, but the EF was normal.   He was last seen by Dr. Marlou Johns in 01/2013 and he was doing excellent.   He was in his usual state of health until this past week after he had an accident. He was helping his wife load up a truck for a trade show when a tool box fell on his chest and the ramp he was standing on collapsed below him. Since that time he's had chest discomfort that is exquisitely tender to palpation as well as left arm numbness prompting him to be evaluated. The symptoms are not reminiscent of his previous unstable angina and 2010 when he had significant exertional dyspnea as well as exertional weakness. He did not remember ever having chest pain at that time. He says the chest pain gets worse with exertion but he is not sure if this is due to movement. He currently has chest pain is worse with palpation. No lower extremity edema, orthopnea or PND. No dizziness or syncope. No exertional weakness.    Past Medical History:  Diagnosis Date  . Anxiety   . CAD (coronary artery disease)   . Coronary atherosclerosis of native coronary artery 01/29/2013  . Hx of seasonal allergies   . Hyperlipidemia   . Hypertension      Past Surgical History:  Procedure Laterality Date  . ANGIOPLASTY  2010   stent  . LUMBAR  LAMINECTOMY  1991   L3 and T12    Allergies  Allergen Reactions  . Chicken Allergy Shortness Of Breath, Nausea And Vomiting and Other (See Comments)    Headache SAME REACTION(S) TO [place]!!!!  . Morphine Shortness Of Breath  . Other Anaphylaxis    Ate a fruit salad and had to go to the E.R.  . Pineapple Anaphylaxis and Rash  . Naproxen Rash    I have reviewed the patient's current medications . aspirin EC  81 mg Oral Daily  . atorvastatin  40 mg Oral QHS  . citalopram  20 mg Oral Daily  . enoxaparin (LOVENOX) injection  40 mg Subcutaneous QHS  . famotidine (PEPCID) IV  20 mg Intravenous Q12H  . gabapentin  600 mg Oral QHS  . irbesartan  75 mg Oral Daily   And  . hydrochlorothiazide  12.5 mg Oral Daily     acetaminophen, HYDROcodone-acetaminophen, nitroGLYCERIN, ondansetron (ZOFRAN) IV  Prior to Admission medications   Medication Sig Start Date End Date Taking? Authorizing Provider  acetaminophen (TYLENOL) 500 MG tablet Take 500 mg by mouth at bedtime.   Yes Historical Provider, MD  aspirin EC 81 MG tablet Take 81 mg by mouth daily.   Yes Historical Provider, MD  atorvastatin (LIPITOR) 40 MG tablet Take 40 mg by mouth at bedtime.   Yes Historical Provider,  MD  citalopram (CELEXA) 20 MG tablet Take 20 mg by mouth daily.  04/24/11  Yes Historical Provider, MD  Coenzyme Q10 (CO Q 10) 100 MG CAPS Take 200 mg by mouth daily.   Yes Historical Provider, MD  finasteride (PROPECIA) 1 MG tablet Take 1 mg by mouth daily. 09/14/15  Yes Historical Provider, MD  fluorouracil (EFUDEX) 5 % cream Apply topically as needed.  02/25/11  Yes Historical Provider, MD  gabapentin (NEURONTIN) 300 MG capsule Take 600 mg by mouth at bedtime. And may take an additional 300 mg once a day if needed   Yes Historical Provider, MD  metoprolol tartrate (LOPRESSOR) 25 MG tablet Take 25 mg by mouth daily.  04/30/11  Yes Historical Provider, MD  Papaya CHEW Chew 1 tablet by mouth 2 (two) times daily as needed (for  indigestion).   Yes Historical Provider, MD  Saw Palmetto 450 MG CAPS Take 1 capsule by mouth daily.   Yes Historical Provider, MD  traMADol (ULTRAM) 50 MG tablet Take 50 mg by mouth daily as needed (for pain).  10/17/15  Yes Historical Provider, MD  valsartan-hydrochlorothiazide (DIOVAN-HCT) 80-12.5 MG tablet Take 1 tablet by mouth every morning.   Yes Historical Provider, MD  valsartan-hydrochlorothiazide (DIOVAN-HCT) 80-12.5 MG per tablet Take 1 tablet by mouth daily.  05/19/11   Historical Provider, MD     Social History   Social History  . Marital status: Married    Spouse name: N/A  . Number of children: N/A  . Years of education: N/A   Occupational History  . Not on file.   Social History Main Topics  . Smoking status: Never Smoker  . Smokeless tobacco: Never Used  . Alcohol use Yes     Comment: rare  . Drug use: No  . Sexual activity: Not on file   Other Topics Concern  . Not on file   Social History Narrative  . No narrative on file    Family Status  Relation Status  . Mother Alive  . Father Deceased  . Neg Hx    Family History  Problem Relation Age of Onset  . Colon cancer Neg Hx   . Stomach cancer Neg Hx     ROS:  Full 14 point review of systems complete and found to be negative unless listed above.  Physical Exam: Blood pressure 125/64, pulse (!) 51, temperature 97.8 F (36.6 C), temperature source Oral, resp. rate 16, height 5\' 11"  (1.803 m), weight 195 lb 8 oz (88.7 kg), SpO2 97 %.  General: Well developed, well nourished, male in no acute distress Head: Eyes PERRLA, No xanthomas.   Normocephalic and atraumatic, oropharynx without edema or exudate. Dentition:  Lungs: CTAB Heart: HRRR S1 S2, no rub/gallop, Heart regular rate and rhythm with S1, S2  murmur. pulses are 2+ extrem.  Chest tender to palpation Neck: No carotid bruits. No lymphadenopathy. NO JVD. Abdomen: Bowel sounds present, abdomen soft and non-tender without masses or hernias noted. Msk:   No spine or cva tenderness. No weakness, no joint deformities or effusions. Extremities: No clubbing or cyanosis. No LE edema.  Neuro: Alert and oriented X 3. No focal deficits noted. Psych:  Good affect, responds appropriately Skin: No rashes or lesions noted.  Labs:   Lab Results  Component Value Date   WBC 6.2 10/30/2015   HGB 15.5 10/30/2015   HCT 44.2 10/30/2015   MCV 91.5 10/30/2015   PLT 122 (L) 10/30/2015   No results for input(s): INR in  the last 72 hours.  Recent Labs Lab 10/30/15 1553  NA 136  K 4.0  CL 101  CO2 31  BUN 11  CREATININE 1.09  CALCIUM 9.0  GLUCOSE 113*   No results found for: MG  Recent Labs  10/30/15 2344 10/31/15 0432  TROPONINI <0.03 <0.03    Recent Labs  10/30/15 1603  TROPIPOC 0.00    TSH  Date/Time Value Ref Range Status  10/30/2015 11:44 PM 1.617 0.350 - 4.500 uIU/mL Final   Cath 2010  IMPRESSION:  1. Severe left anterior descending coronary artery disease in the      proximal and mid distribution.  2. Nonobstructive disease in the right coronary artery, mid region, 40-      50%.  3. Mild anterior wall mid hypokinesis with otherwise preserved      ejection fraction of 60%.  4. Elevated left ventricular end-diastolic pressure consistent with      diastolic dysfunction.  PLAN:  Findings have been discussed with both Dr. Irish Lack and Dr. Rodell Perna.  We will go ahead and give him 600 mg of Plavix now, bolus him  with heparin 4000 units so in the sheath and take him upstairs to the  cardiac catheterization lab for urgent percutaneous intervention.  He is  currently chest pain-free.  FINDINGS:  Findings have been explained to both the patient and his  family.   Echo: Pending  ECG:  Sinus bradycardia HR 45 with TWI in lead III that could be artifact  Radiology:  Dg Chest 2 View  Result Date: 10/30/2015 CLINICAL DATA:  Chest pain EXAM: CHEST  2 VIEW COMPARISON:  None. FINDINGS: Normal heart size. Normal mediastinal  contour. No pneumothorax. No pleural effusion. Lungs appear clear, with no acute consolidative airspace disease and no pulmonary edema. Mild elevation of the left hemidiaphragm. Partially visualized bilateral posterior spinal fusion hardware in the lower thoracic/ upper lumbar spine. IMPRESSION: Mild elevation of the left hemidiaphragm. Otherwise no active disease in the chest. Electronically Signed   By: Ilona Sorrel M.D.   On: 10/30/2015 17:13    ASSESSMENT AND PLAN:    Principal Problem:   Chest pain Active Problems:   Essential hypertension   GERD (gastroesophageal reflux disease)   CAD (coronary artery disease), native coronary artery   Thrombocytopenia (HCC)   Bradycardia on ECG  [name] is a 65 y.o. male with a history of HTN, HLD, GERD, sinus bradycardia and CAD s/p PCI/DES to LAD in 2010 who presented to [facility] on 10/30/15 with chest pain.   Chest pain: troponin neg x3. ECG with no acute ST or TW changes. Chest pain is atypical and not reminiscent of his previous unstable angina when he had cath in 2010. He did have an accident recently with injury to the chest wall. The chest is very tender to palpation with some left arm numbness. He is unsure if it's worse with exertion. Due to his previous cardiac history we will proceed with Lexiscan Myoview today. If negative he can be discharged home with follow-up in our clinic.  CAD: continue ASA, statin. Home Lopressor 25mg  BID has been held 2/2 bradycardia  HTN: BP well controlled on current regimen   Sinus bradycardia: asymptomatic. BB being held currently  Signed: Angelena Form, PA-C 10/31/2015 10:00 AM  Pager [phone]  Co-Sign MD  The patient has been seen in conjunction with Nell Range, PAC. All aspects of care have been considered and discussed. The patient has been personally interviewed, examined,  and all clinical data has been reviewed.   The patient has a prior history of coronary disease with proximal LAD  drug-eluting stent 2010. He is admitted on this occasion with chest discomfort that is clearly musculoskeletal. He also has arm discomfort that seems be related to cervical disc disease. Both these symptoms were preceded by chest trauma 2 weeks ago.  There is a preceding history of mild exertional chest discomfort over the last several months. This could be related to myocardial ischemia. He had mild nonobstructive coronary disease in the circumflex and right coronary at the time of LAD stenting. He has ruled out for myocardial infarction.Plan to perform myocardial perfusion study and if not high risk, he will be eligible for discharge later today.  Would recommend evaluation of above localized neck and arm discomfort by excluding cervical disc disease. The continuous sharp chest discomfort should be treated with anti-inflammatory therapy.

## 2015-10-31 NOTE — Discharge Summary (Signed)
Discharge Summary  [name] [id number] DOB: 1950/11/10  PCP: Irven Shelling, MD  Admit date: 10/30/2015 Discharge date: 10/31/2015  Time spent: <39mins  Recommendations for Outpatient Follow-up:  1. F/u with PMD within a week  for hospital discharge follow up, repeat cbc/bmp at follow up 2. F/u with cardiology on 9/26, patient is instructed to monitor bp and heart rate at home and bring in record for cardiologist to review. 3. Continue all home meds, only new meds nitroglycerin sublingal prn  Discharge Diagnoses:  Active Hospital Problems   Diagnosis Date Noted  . Chest pain 10/30/2015  . Thrombocytopenia ([place]) 10/30/2015  . Bradycardia on ECG 10/30/2015  . CAD (coronary artery disease), native coronary artery 01/29/2013  . GERD (gastroesophageal reflux disease) 03/14/2007  . Essential hypertension 03/14/2007    Resolved Hospital Problems   Diagnosis Date Noted Date Resolved  No resolved problems to display.    Discharge Condition: stable  Diet recommendation: heart healthy  Filed Weights   10/30/15 1549 10/30/15 2336  Weight: 86.2 kg (190 lb) 88.7 kg (195 lb 8 oz)    History of present illness:  Patient coming from: Home  Chief Complaint: Chest pain  HPI: [name] is a 65 y.o. male with medical history significant for hypertension, GERD, and CAD with stent to LAD in 2010, now presenting to the emergency department for evaluation of exertional chest pain for the past 4 days. The patient reports that he had been in his usual state of health until approximately 4 days ago when he noted a dull ache in the central chest with radiation to the left shoulder and upper arm. These symptoms occurred while physically active and resolved over the course of several minutes with rest. He went on to have recurrent episodes of this, with symptoms becoming more frequent and severe, prompting his presentation. Patient takes a daily aspirin and reports taking 4  baby aspirins prior to arrival. He has nitroglycerin at home but has not taken it for any of these episodes. Since the stent was placed in 2010, he has not had a repeat catheterization, stress testing, or echocardiogram performed. He had been doing quite well with no anginal complaints up until 4 days ago. Current symptoms are different than those he experienced prior to the stent placement. In 2010, he was having significant dyspnea with exertion, but that is not the case currently.  ED Course: Upon arrival to the ED, patient is found to be afebrile, saturating well on room air, bradycardic in the low 40s, and with vitals otherwise stable. EKG demonstrates sinus bradycardia with rate 45 and is otherwise normal. Chest x-ray features a mild elevation in the left hemidiaphragm but is negative for acute cardiopulmonary disease. BMP is unremarkable and CBC is notable only for a stable chronic thrombocytopenia. Troponin is undetectable. ED physician discussed the case with cardiology who felt that a medical admission would be more appropriate. Patient has remained hemodynamically stable in the ED with no anginal complaints since his arrival. He'll be observed on the telemetry unit for ongoing evaluation and management of exertional chest pain in a patient with known CAD.  Hospital Course:  Principal Problem:   Chest pain Active Problems:   Essential hypertension   GERD (gastroesophageal reflux disease)   CAD (coronary artery disease), native coronary artery   Thrombocytopenia (HCC)   Bradycardia on ECG   1. Chest pain  - Exertional central CP with radiation to left shoulder and mild SOB - Initial EKG notable for  bradycardia only, no acute ischemic features - troponin negative - ASA 324 mg taken pta, continue Lipitor, hold beta-blocker in setting of bradycardia  -  cardiology consulted , unremarkable stress-test on 9/12, patient is cleared to discharge by cardiology.  2. CAD  - Hx of DES to LAD in  May 2010 - - Continue daily ASA 81 and Lipitor , lopressor held initially, resumed at discharge per cardiology - unremarkable stress test, outpatient cardiology follow up.   3. Sinus Bradycardia  - Rates staying in low-mid 40s without sxs at rest  - negative stress test, tsh 1.6 - beta-blocker held initially, I have discussed with cardiology Dr Tamala Julian in person, Dr Tamala Julian recommend continue betablocker and close cardiology follow up arranged on 9/26.  4. Hypertension - At goal currently  - Continue valsartan-HCTZ, hold metoprolol held in the hospital and restarted per cardiology recommendation.   5. GERD - Esophagitis noted on EGD from 2008; path from biopsies without metaplasia, dysplasia, or malignancy  - Previously on PPI, no longer on any acid-suppressors  - Given the exertional nature of the CP, doubt GI etiology,   6. Thrombocytopenia - Platelet count 122,000 and stable relative to prior CBC  - No suggestion of active blood loss on admission , outpatient followup.   DVT prophylaxis while in the hospital:sq Lovenox  Code Status:Full  Family Communication:patient Disposition Plan:Observe on telemetry Consults called:cardiology   Procedures:  Stress test on 9/12 , unremarkable  Antibiotics:  none    Discharge Exam: BP (!) 167/79 (BP Location: Right Arm)   Pulse (!) 51   Temp 97.5 F (36.4 C) (Axillary)   Resp 17   Ht 5\' 11"  (1.803 m)   Wt 88.7 kg (195 lb 8 oz)   SpO2 100%   BMI 27.27 kg/m   General: NAD Cardiovascular: sinus bradycardia, chest wall tenderness Respiratory: CTABL  Discharge Instructions You were cared for by a hospitalist during your hospital stay. If you have any questions about your discharge medications or the care you received while you were in the hospital after you are discharged, you can call the unit and asked to speak with the hospitalist on call if the hospitalist that took care of you is not available. Once you are  discharged, your primary care physician will handle any further medical issues. Please note that NO REFILLS for any discharge medications will be authorized once you are discharged, as it is imperative that you return to your primary care physician (or establish a relationship with a primary care physician if you do not have one) for your aftercare needs so that they can reassess your need for medications and monitor your lab values.  Discharge Instructions    Diet - low sodium heart healthy    Complete by:  As directed   Increase activity slowly    Complete by:  As directed       Medication List    TAKE these medications   acetaminophen 500 MG tablet Commonly known as:  TYLENOL Take 500 mg by mouth at bedtime.   aspirin EC 81 MG tablet Take 81 mg by mouth daily.   atorvastatin 40 MG tablet Commonly known as:  LIPITOR Take 40 mg by mouth at bedtime.   citalopram 20 MG tablet Commonly known as:  CELEXA Take 20 mg by mouth daily.   Co Q 10 100 MG Caps Take 200 mg by mouth daily.   finasteride 1 MG tablet Commonly known as:  PROPECIA Take 1 mg by  mouth daily.   fluorouracil 5 % cream Commonly known as:  EFUDEX Apply topically as needed.   gabapentin 300 MG capsule Commonly known as:  NEURONTIN Take 600 mg by mouth at bedtime. And may take an additional 300 mg once a day if needed   metoprolol tartrate 25 MG tablet Commonly known as:  LOPRESSOR Take 25 mg by mouth daily.   nitroGLYCERIN 0.4 MG SL tablet Commonly known as:  NITROSTAT Place 1 tablet (0.4 mg total) under the tongue every 5 (five) minutes x 3 doses as needed for chest pain.   Papaya Chew Chew 1 tablet by mouth 2 (two) times daily as needed (for indigestion).   Saw Palmetto 450 MG Caps Take 1 capsule by mouth daily.   traMADol 50 MG tablet Commonly known as:  ULTRAM Take 50 mg by mouth daily as needed (for pain).   valsartan-hydrochlorothiazide 80-12.5 MG tablet Commonly known as:  DIOVAN-HCT Take  1 tablet by mouth every morning.      Allergies  Allergen Reactions  . Chicken Allergy Shortness Of Breath, Nausea And Vomiting and Other (See Comments)    Headache SAME REACTION(S) TO [place]!!!!  . Morphine Shortness Of Breath  . Other Anaphylaxis    Ate a fruit salad and had to go to the E.R.  . Pineapple Anaphylaxis and Rash  . Naproxen Rash   Follow-up Information    Irven Shelling, MD Follow up in 1 week(s).   Specialty:  Internal Medicine Why:  hospital discharge follow up, repeat cbc/bmp at follow up Contact information: [address] [place] [address] [phone]        Angelena Form, PA-C Follow up on 11/14/2015.   Specialties:  Cardiology, Radiology Why:  please monitor your blood pressure and heart rate at home , bring your bp and hr record to your cardiology apppointment. Contact information: [address] [place] [address] [phone]            The results of significant diagnostics from this hospitalization (including imaging, microbiology, ancillary and laboratory) are listed below for reference.    Significant Diagnostic Studies: Dg Chest 2 View  Result Date: 10/30/2015 CLINICAL DATA:  Chest pain EXAM: CHEST  2 VIEW COMPARISON:  None. FINDINGS: Normal heart size. Normal mediastinal contour. No pneumothorax. No pleural effusion. Lungs appear clear, with no acute consolidative airspace disease and no pulmonary edema. Mild elevation of the left hemidiaphragm. Partially visualized bilateral posterior spinal fusion hardware in the lower thoracic/ upper lumbar spine. IMPRESSION: Mild elevation of the left hemidiaphragm. Otherwise no active disease in the chest. Electronically Signed   By: Ilona Sorrel M.D.   On: 10/30/2015 17:13   Nm Myocar Multi W/spect W/wall Motion / Ef  Result Date: 10/31/2015  There was no ST segment deviation noted during stress.  No T wave inversion was noted during stress.  The  study is normal.  Nuclear stress EF: 56%.  The left ventricular ejection fraction is normal (55-65%).  Normal stress nuclear study with no ischemia or infarction; EF 56 with normal wall motion.    Microbiology: No results found for this or any previous visit (from the past 240 hour(s)).   Labs: Basic Metabolic Panel:  Recent Labs Lab 10/30/15 1553  NA 136  K 4.0  CL 101  CO2 31  GLUCOSE 113*  BUN 11  CREATININE 1.09  CALCIUM 9.0   Liver Function Tests: No results for input(s): AST, ALT, ALKPHOS, BILITOT, PROT, ALBUMIN  in the last 168 hours. No results for input(s): LIPASE, AMYLASE in the last 168 hours. No results for input(s): AMMONIA in the last 168 hours. CBC:  Recent Labs Lab 10/30/15 1553  WBC 6.2  HGB 15.5  HCT 44.2  MCV 91.5  PLT 122*   Cardiac Enzymes:  Recent Labs Lab 10/30/15 2344 10/31/15 0432 10/31/15 1137  TROPONINI <0.03 <0.03 <0.03   BNP: BNP (last 3 results) No results for input(s): BNP in the last 8760 hours.  ProBNP (last 3 results) No results for input(s): PROBNP in the last 8760 hours.  CBG: No results for input(s): GLUCAP in the last 168 hours.     SignedFlorencia Reasons MD, PhD  [facility] 10/31/2015, 6:43 PM

## 2015-10-31 NOTE — Progress Notes (Signed)
The patient was seen in nuclear medicine for a Lexiscan myoview. He tolerated the procedure well. No acute ST or TW changes on ECG.   Jettie Booze, NP  [facility]

## 2015-10-31 NOTE — Telephone Encounter (Signed)
Thank you. Agree Candee Furbish, MD

## 2015-11-06 DIAGNOSIS — R0789 Other chest pain: Secondary | ICD-10-CM | POA: Diagnosis not present

## 2015-11-06 DIAGNOSIS — R001 Bradycardia, unspecified: Secondary | ICD-10-CM | POA: Diagnosis not present

## 2015-11-07 ENCOUNTER — Ambulatory Visit (INDEPENDENT_AMBULATORY_CARE_PROVIDER_SITE_OTHER): Payer: Medicare Other | Admitting: Nurse Practitioner

## 2015-11-07 ENCOUNTER — Encounter (INDEPENDENT_AMBULATORY_CARE_PROVIDER_SITE_OTHER): Payer: Self-pay

## 2015-11-07 ENCOUNTER — Encounter: Payer: Self-pay | Admitting: Nurse Practitioner

## 2015-11-07 VITALS — BP 120/68 | HR 70 | Ht 71.0 in | Wt 198.4 lb

## 2015-11-07 DIAGNOSIS — R0789 Other chest pain: Secondary | ICD-10-CM

## 2015-11-07 DIAGNOSIS — I10 Essential (primary) hypertension: Secondary | ICD-10-CM

## 2015-11-07 DIAGNOSIS — R001 Bradycardia, unspecified: Secondary | ICD-10-CM

## 2015-11-07 MED ORDER — VALSARTAN-HYDROCHLOROTHIAZIDE 160-12.5 MG PO TABS
1.0000 | ORAL_TABLET | Freq: Every day | ORAL | 3 refills | Status: DC
Start: 2015-11-07 — End: 2016-08-07

## 2015-11-07 NOTE — Progress Notes (Signed)
CARDIOLOGY OFFICE NOTE  Date:  11/07/2015    [name] Date of Birth: November 24, 1950 Medical Record [id number]  PCP:  Irven Shelling, MD  Cardiologist:  [facility]  Chief Complaint  Patient presents with  . Follow-up    Post hospital visit - seen for Dr. Marlou Porch    History of Present Illness: [name] is a 65 y.o. male who presents today for a post hospital visit. Seen for Dr. Marlou Porch.   He has a history of HTN, GERD, and CAD with stent to LAD in 2010.   He had not been seen since December of 2014 in this office.   Presented earlier this month with chest pain -ended up in the ER. Was bradycardic in the low 40s, and with vitals otherwise stable. EKG demonstrated sinus bradycardia with rate 45 and was otherwise normal. Chest x-ray showed a mild elevation in the left hemidiaphragm but was negative for acute cardiopulmonary disease. BMP was unremarkable and CBC was notable only for a stable chronic thrombocytopenia. Troponin was undetectable. He was admitted and had unremarkable stress test. He was continued on beta blocker - he was asymptomatic with his bradycardia. No further work up felt to be needed.   Comes back today. Here with his wife. He has stopped his Metoprolol over the past few days due to low heart rates in the 40's. Saw his PCP yesterday who also advised staying off. BP tracking up some. He feels fine. 3 weeks ago - a tool box fell on his chest - they now think this might have caused his chest pain. No real chest pain syndrome with his stent - had more fatigue. Still has some soreness in his chest and notes soreness with palpation.   Past Medical History:  Diagnosis Date  . Anxiety   . CAD (coronary artery disease)   . Coronary atherosclerosis of native coronary artery 01/29/2013  . Hx of seasonal allergies   . Hyperlipidemia   . Hypertension     Past Surgical History:  Procedure Laterality Date  . ANGIOPLASTY  2010   stent  . LUMBAR LAMINECTOMY   1991   L3 and T12     Medications: Current Outpatient Prescriptions  Medication Sig Dispense Refill  . acetaminophen (TYLENOL) 500 MG tablet Take 500 mg by mouth at bedtime.    Marland Kitchen aspirin EC 81 MG tablet Take 81 mg by mouth daily.    Marland Kitchen atorvastatin (LIPITOR) 40 MG tablet Take 40 mg by mouth at bedtime.    . citalopram (CELEXA) 10 MG tablet Take 10 mg by mouth daily.    . Coenzyme Q10 (CO Q 10) 100 MG CAPS Take 200 mg by mouth daily.    . diphenhydramine-acetaminophen (TYLENOL PM) 25-500 MG TABS tablet Take 1 tablet by mouth at bedtime.    . finasteride (PROPECIA) 1 MG tablet Take 1 mg by mouth daily.    Marland Kitchen gabapentin (NEURONTIN) 300 MG capsule Take 600 mg by mouth at bedtime. And may take an additional 300 mg once a day if needed    . nitroGLYCERIN (NITROSTAT) 0.4 MG SL tablet Place 1 tablet (0.4 mg total) under the tongue every 5 (five) minutes x 3 doses as needed for chest pain. 30 tablet 0  . Papaya CHEW Chew 1 tablet by mouth 2 (two) times daily as needed (for indigestion).    . Saw Palmetto 450 MG CAPS Take 1 capsule by mouth daily.    . traMADol (ULTRAM) 50 MG tablet Take  50 mg by mouth daily as needed (for pain).     . valsartan-hydrochlorothiazide (DIOVAN HCT) 160-12.5 MG tablet Take 1 tablet by mouth daily. 90 tablet 3   No current facility-administered medications for this visit.     Allergies: Allergies  Allergen Reactions  . Chicken Allergy Shortness Of Breath, Nausea And Vomiting and Other (See Comments)    Headache SAME REACTION(S) TO [place]!!!!  . Morphine Shortness Of Breath  . Other Anaphylaxis    Ate a fruit salad and had to go to the E.R.  . Pineapple Anaphylaxis and Rash  . Naproxen Rash    Social History: The patient  reports that he has never smoked. He has never used smokeless tobacco. He reports that he drinks alcohol. He reports that he does not use drugs.   Family History: The patient's family history is not on file.   Review of Systems: Please  see the history of present illness.   Otherwise, the review of systems is positive for none.   All other systems are reviewed and negative.   Physical Exam: VS:  BP 120/68   Pulse 70   Ht 5\' 11"  (1.803 m)   Wt 198 lb 6.4 oz (90 kg)   SpO2 97% Comment: at rest  BMI 27.67 kg/m  .  BMI Body mass index is 27.67 kg/m.  Wt Readings from Last 3 Encounters:  11/07/15 198 lb 6.4 oz (90 kg)  10/30/15 195 lb 8 oz (88.7 kg)  01/29/13 202 lb (91.6 kg)   BP by me is 130/80  General: Pleasant. Well developed, well nourished and in no acute distress.   HEENT: Normal.  Neck: Supple, no JVD, carotid bruits, or masses noted.  Cardiac: Regular rate and rhythm. No murmurs, rubs, or gallops. He has palpable chest wall pain. No edema.  Respiratory:  Lungs are clear to auscultation bilaterally with normal work of breathing.  GI: Soft and nontender.  MS: No deformity or atrophy. Gait and ROM intact.  Skin: Warm and dry. Color is normal.  Neuro:  Strength and sensation are intact and no gross focal deficits noted.  Psych: Alert, appropriate and with normal affect.   LABORATORY DATA:  EKG:  EKG is not ordered today.  Lab Results  Component Value Date   WBC 6.2 10/30/2015   HGB 15.5 10/30/2015   HCT 44.2 10/30/2015   PLT 122 (L) 10/30/2015   GLUCOSE 113 (H) 10/30/2015   CHOL 150 10/31/2015   TRIG 201 (H) 10/31/2015   HDL 40 (L) 10/31/2015   LDLCALC 70 10/31/2015   NA 136 10/30/2015   K 4.0 10/30/2015   CL 101 10/30/2015   CREATININE 1.09 10/30/2015   BUN 11 10/30/2015   CO2 31 10/30/2015   TSH 1.617 10/30/2015    BNP (last 3 results) No results for input(s): BNP in the last 8760 hours.  ProBNP (last 3 results) No results for input(s): PROBNP in the last 8760 hours.   Other Studies Reviewed Today:  Myoview Study Result from 10/2015    There was no ST segment deviation noted during stress.  No T wave inversion was noted during stress.  The study is normal.  Nuclear stress  EF: 56%.  The left ventricular ejection fraction is normal (55-65%).   Normal stress nuclear study with no ischemia or infarction; EF 56 with normal wall motion.      Assessment/Plan:  1. Chest pain - negative Myoview earlier this month. Has palpable chest wall pain on exam today.  Nothing reminiscent of his prior chest pain syndrome which was actually more fatigue.    2. CAD - Hx of DES to LAD in May 2010 - he continues on statin & ASA.   3. Sinus Bradycardia - off beta blocker - would leave off.   4. Hypertension - BP tracking up some at home - will increase the ValsartanHCT to 160/12.5  5. GERD  6. Thrombocytopenia  Current medicines are reviewed with the patient today.  The patient does not have concerns regarding medicines other than what has been noted above.  The following changes have been made:  See above.  Labs/ tests ordered today include:    Orders Placed This Encounter  Procedures  . Basic metabolic panel     Disposition:   FU with Dr. Marlou Porch in 6 months.  Patient is agreeable to this plan and will call if any problems develop in the interim.   Signed: Burtis Junes, RN, ANP-C 11/07/2015 11:51 AM  [facility] [address] [place], [place]  [address] Phone: [phone] Fax: [phone]

## 2015-11-07 NOTE — Patient Instructions (Addendum)
We will be checking the following labs today - NONE  BMET in 2 weeks   Medication Instructions:    Continue with your current medicines. BUT  I am changing your Valsartan HCT to 160-12.5 mg to take one each AM - this has been sent to your drug store  STOP Metoprolol    Testing/Procedures To Be Arranged:  N/A  Follow-Up:   See Dr. Marlou Porch in 6 months   Other Special Instructions:   Keep a check on your blood pressure - sign up for MyChart to let us know how your BP is doing and if we need to make any changes.     If you need a refill on your cardiac medications before your next appointment, please call your pharmacy.   Call the [place] office at [phone] if you have any questions, problems or concerns.

## 2015-11-14 ENCOUNTER — Ambulatory Visit: Payer: Self-pay | Admitting: Physician Assistant

## 2015-11-30 ENCOUNTER — Other Ambulatory Visit: Payer: Medicare Other | Admitting: *Deleted

## 2015-11-30 DIAGNOSIS — I10 Essential (primary) hypertension: Secondary | ICD-10-CM

## 2015-11-30 LAB — BASIC METABOLIC PANEL
BUN: 12 mg/dL (ref 7–25)
CO2: 29 mmol/L (ref 20–31)
Calcium: 8.9 mg/dL (ref 8.6–10.3)
Chloride: 102 mmol/L (ref 98–110)
Creat: 1.05 mg/dL (ref 0.70–1.25)
Glucose, Bld: 138 mg/dL — ABNORMAL HIGH (ref 65–99)
Potassium: 3.9 mmol/L (ref 3.5–5.3)
Sodium: 139 mmol/L (ref 135–146)

## 2016-03-29 ENCOUNTER — Encounter: Payer: Self-pay | Admitting: Gastroenterology

## 2016-05-09 DIAGNOSIS — L820 Inflamed seborrheic keratosis: Secondary | ICD-10-CM | POA: Diagnosis not present

## 2016-05-09 DIAGNOSIS — L814 Other melanin hyperpigmentation: Secondary | ICD-10-CM | POA: Diagnosis not present

## 2016-05-09 DIAGNOSIS — L821 Other seborrheic keratosis: Secondary | ICD-10-CM | POA: Diagnosis not present

## 2016-05-09 DIAGNOSIS — L570 Actinic keratosis: Secondary | ICD-10-CM | POA: Diagnosis not present

## 2016-05-09 DIAGNOSIS — D225 Melanocytic nevi of trunk: Secondary | ICD-10-CM | POA: Diagnosis not present

## 2016-05-15 DIAGNOSIS — K219 Gastro-esophageal reflux disease without esophagitis: Secondary | ICD-10-CM | POA: Diagnosis not present

## 2016-05-15 DIAGNOSIS — N400 Enlarged prostate without lower urinary tract symptoms: Secondary | ICD-10-CM | POA: Diagnosis not present

## 2016-05-15 DIAGNOSIS — Z0000 Encounter for general adult medical examination without abnormal findings: Secondary | ICD-10-CM | POA: Diagnosis not present

## 2016-05-15 DIAGNOSIS — R7301 Impaired fasting glucose: Secondary | ICD-10-CM | POA: Diagnosis not present

## 2016-05-15 DIAGNOSIS — F325 Major depressive disorder, single episode, in full remission: Secondary | ICD-10-CM | POA: Diagnosis not present

## 2016-05-15 DIAGNOSIS — E782 Mixed hyperlipidemia: Secondary | ICD-10-CM | POA: Diagnosis not present

## 2016-05-15 DIAGNOSIS — Z1389 Encounter for screening for other disorder: Secondary | ICD-10-CM | POA: Diagnosis not present

## 2016-05-15 DIAGNOSIS — Z1211 Encounter for screening for malignant neoplasm of colon: Secondary | ICD-10-CM | POA: Diagnosis not present

## 2016-05-15 DIAGNOSIS — I10 Essential (primary) hypertension: Secondary | ICD-10-CM | POA: Diagnosis not present

## 2016-05-15 DIAGNOSIS — Z125 Encounter for screening for malignant neoplasm of prostate: Secondary | ICD-10-CM | POA: Diagnosis not present

## 2016-05-28 ENCOUNTER — Ambulatory Visit: Payer: Medicare Other | Admitting: Physician Assistant

## 2016-06-03 ENCOUNTER — Ambulatory Visit (INDEPENDENT_AMBULATORY_CARE_PROVIDER_SITE_OTHER): Payer: Medicare Other | Admitting: Physician Assistant

## 2016-06-03 ENCOUNTER — Telehealth: Payer: Self-pay | Admitting: Physician Assistant

## 2016-06-03 ENCOUNTER — Encounter: Payer: Self-pay | Admitting: Physician Assistant

## 2016-06-03 VITALS — BP 134/64 | HR 60 | Ht 71.0 in | Wt 197.6 lb

## 2016-06-03 DIAGNOSIS — R1319 Other dysphagia: Secondary | ICD-10-CM | POA: Diagnosis not present

## 2016-06-03 DIAGNOSIS — Z86010 Personal history of colonic polyps: Secondary | ICD-10-CM | POA: Diagnosis not present

## 2016-06-03 MED ORDER — NA SULFATE-K SULFATE-MG SULF 17.5-3.13-1.6 GM/177ML PO SOLN: 1.0000 | ORAL | 0 refills | Status: DC

## 2016-06-03 NOTE — Progress Notes (Signed)
Subjective:    Patient ID: [name], male    DOB: 1950-07-05, 66 y.o.   MRN: [id number]  HPI [name] is a pleasant 66 year old white male, known to Dr. Fuller Plan. He comes in today to discuss recall colonoscopy and also has complaints of dysphagia. Patient has history of hypertension, very artery disease for which he is status post stent in 2010 did He is maintained just on aspirin. He last had colonoscopy in April 2013 which was normal. He does have history of adenomatous colon polyp on colonoscopy in 2008. Pt  has no current complaints of abdominal discomfort changes in bowel habits melena or hematochezia. Last EGD was done in May 2016 with finding of a 3 cm hiatal hernia, he had Savary dilation to 17 mm for complaints of dysphagia but did not have stricture. Biopsies were done to rule out Barrett's and these were negative. Patient states that over the past 9-10 months he has been having difficulty with recurrent dysphagia. He cannot tell me clearly that he remembers whether or not the previous dilation helped him though he doesn't recall having problems until about 9 months ago. He is not having symptoms on a daily basis but says at least a couple of times per week generally with eating solid food he will feel as if his food is lodging and points to his lower throat area. He is able to speak with these episodes. He says he is uncomfortable and has to stop eating. Eventually the food will go on down and usually does not continue to eat before that meal. He has not had any episodes of regurgitation. He denies any regular heartburn or indigestion. He has had a lot of gas. He does not take a PPI that has been using papaya extract as needed for heartburn.  Review of Systems Pertinent positive and negative review of systems were noted in the above HPI section.  All other review of systems was otherwise negative.  Outpatient Encounter Prescriptions as of 06/03/2016  Medication Sig  . acetaminophen  (TYLENOL) 500 MG tablet Take 500 mg by mouth at bedtime.  Marland Kitchen aspirin EC 81 MG tablet Take 81 mg by mouth daily.  Marland Kitchen atorvastatin (LIPITOR) 40 MG tablet Take 40 mg by mouth at bedtime.  . citalopram (CELEXA) 10 MG tablet Take 10 mg by mouth daily.  . Coenzyme Q10 (CO Q 10) 100 MG CAPS Take 200 mg by mouth daily.  . diphenhydramine-acetaminophen (TYLENOL PM) 25-500 MG TABS tablet Take 1 tablet by mouth at bedtime.  . finasteride (PROPECIA) 1 MG tablet Take 1 mg by mouth daily.  Marland Kitchen gabapentin (NEURONTIN) 300 MG capsule Take 600 mg by mouth at bedtime. And may take an additional 300 mg once a day if needed  . nitroGLYCERIN (NITROSTAT) 0.4 MG SL tablet Place 1 tablet (0.4 mg total) under the tongue every 5 (five) minutes x 3 doses as needed for chest pain.  . Papaya CHEW Chew 1 tablet by mouth 2 (two) times daily as needed (for indigestion).  . Saw Palmetto 450 MG CAPS Take 1 capsule by mouth daily.  . traMADol (ULTRAM) 50 MG tablet Take 50 mg by mouth daily as needed (for pain).   . valsartan-hydrochlorothiazide (DIOVAN HCT) 160-12.5 MG tablet Take 1 tablet by mouth daily.  . Na Sulfate-K Sulfate-Mg Sulf (SUPREP BOWEL PREP KIT) 17.5-3.13-1.6 GM/180ML SOLN Take 1 kit by mouth as directed.   No facility-administered encounter medications on file as of 06/03/2016.    Allergies  Allergen  Reactions  . Chicken Allergy Shortness Of Breath, Nausea And Vomiting and Other (See Comments)    Headache SAME REACTION(S) TO [place]!!!!  . Morphine Shortness Of Breath  . Other Anaphylaxis    Ate a fruit salad and had to go to the E.R.  . Pineapple Anaphylaxis and Rash  . Naproxen Rash   Patient Active Problem List   Diagnosis Date Noted  . Chest pain 10/30/2015  . Thrombocytopenia ([place]) 10/30/2015  . Bradycardia on ECG 10/30/2015  . CAD (coronary artery disease), native coronary artery 01/29/2013  . Pure hypercholesterolemia 01/29/2013  . COLONIC POLYPS, ADENOMATOUS 03/14/2007  . Essential hypertension  03/14/2007  . GERD (gastroesophageal reflux disease) 03/14/2007  . HIATAL HERNIA 03/14/2007  . NEPHROLITHIASIS 03/14/2007  . BACKACHE NOS 06/03/2006  . HEMORRHOIDS 11/18/2000   Social History   Social History  . Marital status: Married    Spouse name: N/A  . Number of children: N/A  . Years of education: N/A   Occupational History  . Not on file.   Social History Main Topics  . Smoking status: Never Smoker  . Smokeless tobacco: Never Used  . Alcohol use Yes     Comment: rare  . Drug use: No  . Sexual activity: Not on file   Other Topics Concern  . Not on file   Social History Narrative  . No narrative on file    Mr. [name]'s family history is not on file.      Objective:    Vitals:   06/03/16 1033  BP: 134/64  Pulse: 60    Physical Exam  well-developed older white male in no acute distress, pleasant blood pressure 134/64 pulse 60, height 5 foot 11, weight 197, BMI 27.5. HEENT; nontraumatic normocephalic EOMI PERRLA sclera anicteric, Cardiovascular; regular rate and rhythm with S1-S2 no murmur rub or gallop, Pulmonary; clear bilaterally, Abdomen; soft nontender nondistended bowel sounds are active there is no palpable mass or hepatosplenomegaly, Rectal; exam not done, Extremities; no clubbing cyanosis or edema skin warm and dry, Neuropsych; mood and affect appropriate       Assessment & Plan:   #14 66 year old white male with history of adenomatous colon polyps at colonoscopy 2008, negative colonoscopy 2013, due for follow-up colonoscopy #2 solid food dysphagia-patient had similar complaints in May 2016 and underwent Savary dilation to 17 mm though no stricture noted. Recurrent symptoms over the past 9 months. Rule out occult stricture last early stricture, consider motility disorder though feel less likely #3 coronary artery disease status post stent 2010 maintained on aspirin only #4 hypertension  Plan; Patient will be scheduled for colonoscopy and upper  endoscopy with probable esophageal dilation with Dr. Fuller Plan. Both procedures were discussed in detail with patient including risks and benefits and he is agreeable to proceed.  Amy Genia Harold PA-C 06/03/2016   Cc: Lavone Orn, MD

## 2016-06-03 NOTE — Telephone Encounter (Signed)
Spoke to pharmacist and used coupon and rx will now be $50. Pharmacist will call patient.

## 2016-06-03 NOTE — Patient Instructions (Signed)

## 2016-06-03 NOTE — Progress Notes (Signed)
Reviewed and agree with management plan. Barium esophagram with tablet to further evaluate dysphagia.   Pricilla Riffle. Fuller Plan, MD [facility]

## 2016-06-04 ENCOUNTER — Telehealth: Payer: Self-pay

## 2016-06-04 DIAGNOSIS — R1319 Other dysphagia: Secondary | ICD-10-CM

## 2016-06-04 NOTE — Telephone Encounter (Signed)
Patient notified of the recommendations. BS scheduled at [facility] for 06/17/16 11:00.  He is notified to be NPO for 3 hours prior.

## 2016-06-04 NOTE — Telephone Encounter (Signed)
-----   Message from Alfredia Ferguson, PA-C sent at 06/04/2016  9:56 AM EDT ----- Regarding: schedule barium swallow [facility] - I saw this pt yesterday, scheduled EGD and Colon with Fuller Plan- please call pt and let him know Dr Fuller Plan and I discussed his dysphagia- and since he did not have a stricture last time Dr Fuller Plan would like him to have a Barium swallow  Prior to his EGD - please schedule - can put under my name- thank you

## 2016-06-10 ENCOUNTER — Telehealth: Payer: Self-pay | Admitting: Gastroenterology

## 2016-06-10 NOTE — Telephone Encounter (Signed)
I left a detailed message for the patient to contact central scheduling at [phone] and they can help him reschedule the BS

## 2016-06-12 ENCOUNTER — Ambulatory Visit (HOSPITAL_COMMUNITY): Payer: Medicare Other

## 2016-06-17 ENCOUNTER — Ambulatory Visit (HOSPITAL_COMMUNITY): Payer: Medicare Other

## 2016-06-19 ENCOUNTER — Ambulatory Visit (HOSPITAL_COMMUNITY)
Admission: RE | Admit: 2016-06-19 | Discharge: 2016-06-19 | Disposition: A | Discharge status: Home / Self Care | Payer: Medicare Other | Source: Ambulatory Visit | Attending: Physician Assistant | Admitting: Physician Assistant

## 2016-06-19 DIAGNOSIS — K219 Gastro-esophageal reflux disease without esophagitis: Secondary | ICD-10-CM | POA: Diagnosis not present

## 2016-06-19 DIAGNOSIS — R1319 Other dysphagia: Secondary | ICD-10-CM

## 2016-06-19 DIAGNOSIS — K5790 Diverticulosis of intestine, part unspecified, without perforation or abscess without bleeding: Secondary | ICD-10-CM | POA: Insufficient documentation

## 2016-07-02 ENCOUNTER — Encounter: Payer: Self-pay | Admitting: Gastroenterology

## 2016-07-02 ENCOUNTER — Ambulatory Visit (AMBULATORY_SURGERY_CENTER): Payer: Medicare Other | Admitting: Gastroenterology

## 2016-07-02 VITALS — BP 123/59 | HR 63 | Temp 97.8°F | Resp 16 | Ht 71.0 in | Wt 197.0 lb

## 2016-07-02 DIAGNOSIS — K2100 Gastro-esophageal reflux disease with esophagitis, without bleeding: Secondary | ICD-10-CM

## 2016-07-02 DIAGNOSIS — D123 Benign neoplasm of transverse colon: Secondary | ICD-10-CM

## 2016-07-02 DIAGNOSIS — D120 Benign neoplasm of cecum: Secondary | ICD-10-CM | POA: Diagnosis not present

## 2016-07-02 DIAGNOSIS — R1310 Dysphagia, unspecified: Secondary | ICD-10-CM

## 2016-07-02 DIAGNOSIS — K222 Esophageal obstruction: Secondary | ICD-10-CM

## 2016-07-02 DIAGNOSIS — Z86010 Personal history of colonic polyps: Secondary | ICD-10-CM | POA: Diagnosis present

## 2016-07-02 MED ORDER — SODIUM CHLORIDE 0.9 % IV SOLN: 500.0000 mL | INTRAVENOUS | Status: DC

## 2016-07-02 MED ORDER — OMEPRAZOLE 40 MG PO CPDR: 40.0000 mg | DELAYED_RELEASE_CAPSULE | Freq: Every day | ORAL | 11 refills | Status: DC

## 2016-07-02 NOTE — Op Note (Signed)
[facility] Patient Name: [name] Procedure Date: 07/02/2016 3:17 PM MRN: [id number] Endoscopist: Ladene Artist , MD Age: 66 Referring MD:  Date of Birth: [birth year] Gender: Male Account #: [id number] Procedure:                Upper GI endoscopy Indications:              Dysphagia, Gastro-esophageal reflux disease Medicines:                Monitored Anesthesia Care Procedure:                Pre-Anesthesia Assessment:                           - Prior to the procedure, a History and Physical                            was performed, and patient medications and                            allergies were reviewed. The patient's tolerance of                            previous anesthesia was also reviewed. The risks                            and benefits of the procedure and the sedation                            options and risks were discussed with the patient.                            All questions were answered, and informed consent                            was obtained. Prior Anticoagulants: The patient has                            taken no previous anticoagulant or antiplatelet                            agents. ASA Grade Assessment: II - A patient with                            mild systemic disease. After reviewing the risks                            and benefits, the patient was deemed in                            satisfactory condition to undergo the procedure.                           After obtaining informed consent, the endoscope was  passed under direct vision. Throughout the                            procedure, the patient's blood pressure, pulse, and                            oxygen saturations were monitored continuously. The                            Endoscope was introduced through the mouth, and                            advanced to the second part of duodenum. The upper                            GI endoscopy  was accomplished without difficulty.                            The patient tolerated the procedure well. Scope In: Scope Out: Findings:                 LA Grade B (one or more mucosal breaks greater than                            5 mm, not extending between the tops of two mucosal                            folds) esophagitis with no bleeding was found in                            the distal esophagus.                           One moderate benign-appearing, intrinsic stenosis                            was found at the gastroesophageal junction. This                            measured 1.2 cm (inner diameter) and was traversed.                            A guidewire was placed and the scope was withdrawn.                            Dilations were performed with Savary dilators with                            mild resistance at 13 mm, 14 mm and 15 mm.                           The exam of the esophagus was otherwise normal.  A small hiatal hernia was present.                           The exam of the stomach was otherwise normal.                           The duodenal bulb and second portion of the                            duodenum were normal. Complications:            No immediate complications. Estimated Blood Loss:     Estimated blood loss: none. Impression:               - LA Grade B reflux esophagitis.                           - Benign-appearing esophageal stenosis. Dilated.                           - Small hiatal hernia.                           - Normal second portion of the duodenum.                           - No specimens collected. Recommendation:           - Patient has a contact number available for                            emergencies. The signs and symptoms of potential                            delayed complications were discussed with the                            patient. Return to normal activities tomorrow.                             Written discharge instructions were provided to the                            patient.                           - Clear liquid diet for 2 hours, then advance as                            tolerated to soft diet today. Resume prior diet                            tomroorw.                           - Antireflux measures long term.                           -  Continue present medications.                           - Prilosec (omeprazole) 40 mg PO daily                            indefinitely, 1 year of refills.                           - Return to GI office in 2 months Ladene Artist, MD 07/02/2016 3:53:38 PM This report has been signed electronically.

## 2016-07-02 NOTE — Progress Notes (Signed)
Spontaneous respirations throughout. VSS. Resting comfortably. To PACU on room air. Report to  [employer].

## 2016-07-02 NOTE — Op Note (Signed)
[facility] Patient Name: [name] Procedure Date: 07/02/2016 3:18 PM MRN: [id number] Endoscopist: Ladene Artist , MD Age: 66 Referring MD:  Date of Birth: [birth year] Gender: Male Account #: [id number] Procedure:                Colonoscopy Indications:              Surveillance: Personal history of adenomatous                            polyps on last colonoscopy > 5 years ago Medicines:                Monitored Anesthesia Care Procedure:                Pre-Anesthesia Assessment:                           - Prior to the procedure, a History and Physical                            was performed, and patient medications and                            allergies were reviewed. The patient's tolerance of                            previous anesthesia was also reviewed. The risks                            and benefits of the procedure and the sedation                            options and risks were discussed with the patient.                            All questions were answered, and informed consent                            was obtained. Prior Anticoagulants: The patient has                            taken no previous anticoagulant or antiplatelet                            agents. ASA Grade Assessment: II - A patient with                            mild systemic disease. After reviewing the risks                            and benefits, the patient was deemed in                            satisfactory condition to undergo the procedure.  After obtaining informed consent, the colonoscope                            was passed under direct vision. Throughout the                            procedure, the patient's blood pressure, pulse, and                            oxygen saturations were monitored continuously. The                            Model PCF-H190DL [id number]) scope was introduced                            through the anus and  advanced to the the cecum,                            identified by appendiceal orifice and ileocecal                            valve. The ileocecal valve, appendiceal orifice,                            and rectum were photographed. The quality of the                            bowel preparation was good. The colonoscopy was                            performed without difficulty. The patient tolerated                            the procedure well. Scope In: 3:25:38 PM Scope Out: 3:37:58 PM Scope Withdrawal Time: 0 hours 11 minutes 13 seconds  Total Procedure Duration: 0 hours 12 minutes 20 seconds  Findings:                 The perianal and digital rectal examinations were                            normal.                           A 5 mm polyp was found in the cecum. The polyp was                            sessile. The polyp was removed with a cold biopsy                            forceps. Resection and retrieval were complete.                           Two sessile polyps were found in the transverse  colon. The polyps were 6 to 7 mm in size. These                            polyps were removed with a cold snare. Resection                            and retrieval were complete.                           The exam was otherwise without abnormality on                            direct and retroflexion views. Complications:            No immediate complications. Estimated blood loss:                            None. Estimated Blood Loss:     Estimated blood loss: none. Impression:               - One 5 mm polyp in the cecum, removed with a cold                            biopsy forceps. Resected and retrieved.                           - Two 6 to 7 mm polyps in the transverse colon,                            removed with a cold snare. Resected and retrieved.                           - The examination was otherwise normal on direct                             and retroflexion views. Recommendation:           - Repeat colonoscopy in 5 years for surveillance.                           - Patient has a contact number available for                            emergencies. The signs and symptoms of potential                            delayed complications were discussed with the                            patient. Return to normal activities tomorrow.                            Written discharge instructions were provided to the  patient.                           - Resume previous diet.                           - Continue present medications.                           - Await pathology results. Ladene Artist, MD 07/02/2016 3:41:00 PM This report has been signed electronically.

## 2016-07-02 NOTE — Progress Notes (Signed)
Called to room to assist during endoscopic procedure.  Patient ID and intended procedure confirmed with present staff. Received instructions for my participation in the procedure from the performing physician.  

## 2016-07-02 NOTE — Patient Instructions (Signed)
YOU HAD AN ENDOSCOPIC PROCEDURE TODAY AT [facility]:   Refer to the procedure report that was given to you for any specific questions about what was found during the examination.  If the procedure report does not answer your questions, please call your gastroenterologist to clarify.  If you requested that your care partner not be given the details of your procedure findings, then the procedure report has been included in a sealed envelope for you to review at your convenience later.  YOU SHOULD EXPECT: Some feelings of bloating in the abdomen. Passage of more gas than usual.  Walking can help get rid of the air that was put into your GI tract during the procedure and reduce the bloating. If you had a lower endoscopy (such as a colonoscopy or flexible sigmoidoscopy) you may notice spotting of blood in your stool or on the toilet paper. If you underwent a bowel prep for your procedure, you may not have a normal bowel movement for a few days.  Please Note:  You might notice some irritation and congestion in your nose or some drainage.  This is from the oxygen used during your procedure.  There is no need for concern and it should clear up in a day or so.  SYMPTOMS TO REPORT IMMEDIATELY:   Following lower endoscopy (colonoscopy or flexible sigmoidoscopy):  Excessive amounts of blood in the stool  Significant tenderness or worsening of abdominal pains  Swelling of the abdomen that is new, acute  Fever of 100F or higher   Following upper endoscopy (EGD)  Vomiting of blood or coffee ground material  New chest pain or pain under the shoulder blades  Painful or persistently difficult swallowing  New shortness of breath  Fever of 100F or higher  Black, tarry-looking stools  For urgent or emergent issues, a gastroenterologist can be reached at any hour by calling [phone].   DIET:  CLEAR LIQUIDS UNTIL 6 PM AFTER 6 PM UNTIL MORNING ONLY SOFT FOODS. RESUME YOUR REGULAR DIET  IN THE AM.  RETURN TO DR. Lynne Leader OFFICE IN 2 MONTHS.  ACTIVITY:  You should plan to take it easy for the rest of today and you should NOT DRIVE or use heavy machinery until tomorrow (because of the sedation medicines used during the test).    FOLLOW UP: Our staff will call the number listed on your records the next business day following your procedure to check on you and address any questions or concerns that you may have regarding the information given to you following your procedure. If we do not reach you, we will leave a message.  However, if you are feeling well and you are not experiencing any problems, there is no need to return our call.  We will assume that you have returned to your regular daily activities without incident.  If any biopsies were taken you will be contacted by phone or by letter within the next 1-3 weeks.  Please call us at [phone] if you have not heard about the biopsies in 3 weeks.    SIGNATURES/CONFIDENTIALITY: You and/or your care partner have signed paperwork which will be entered into your electronic medical record.  These signatures attest to the fact that that the information above on your After Visit Summary has been reviewed and is understood.  Full responsibility of the confidentiality of this discharge information lies with you and/or your care-partner.

## 2016-07-02 NOTE — Progress Notes (Addendum)
PATIENT WITH COUGH ENTIRE TIME IN RECOVERY, NO SPUTUM PRODUCTION. NO FEVER, DENIES CHEST PAIN, OR SHORTNESS OF BREATH. DISCHARGING SA02 96% ON ROOM AIR. TEMPERATURE 97.8.

## 2016-07-03 ENCOUNTER — Telehealth: Payer: Self-pay | Admitting: *Deleted

## 2016-07-03 DIAGNOSIS — R300 Dysuria: Secondary | ICD-10-CM | POA: Diagnosis not present

## 2016-07-03 DIAGNOSIS — M549 Dorsalgia, unspecified: Secondary | ICD-10-CM | POA: Diagnosis not present

## 2016-07-03 DIAGNOSIS — K2210 Ulcer of esophagus without bleeding: Secondary | ICD-10-CM

## 2016-07-03 DIAGNOSIS — N481 Balanitis: Secondary | ICD-10-CM | POA: Diagnosis not present

## 2016-07-03 DIAGNOSIS — N200 Calculus of kidney: Secondary | ICD-10-CM | POA: Diagnosis not present

## 2016-07-03 NOTE — Telephone Encounter (Signed)
  Follow up Call-  Call back number 07/02/2016  Post procedure Call Back phone  # [phone]  Permission to leave phone message Yes  Some recent data might be hidden     Patient questions:  Do you have a fever, pain , or abdominal swelling? No. Pain Score  0 *  Have you tolerated food without any problems? Yes.    Have you been able to return to your normal activities? Yes.    Do you have any questions about your discharge instructions: Diet   No. Medications  Yes.   Follow up visit  No.  Do you have questions or concerns about your Care? No.  Actions: * If pain score is 4 or above: No action needed, pain <4.  Dr. Fuller Plan you gave a Rx. Of  Omeprazole 40 mg daily to this pt. His wife spoke with me this morning and told me that the pt. Cardiology doctor took him off of this medication when he had a blockage and put him on zantac,she stated that Dr. Marlou Porch felt like omeprazole was a medication that had a hand in causing the blockage, please advise as what you want me to instruct the pt. To do about this medication. Spoke with pt. He stated that last night he started spiking a temp of 100 and heart rate got up to 120 but they did not call on call doctor, this morning pt. Stated that he feels better,temp 98.5,heart rate is 75 and blood pressure 138/72,he denies pain and abdominal swelling.

## 2016-07-03 NOTE — Telephone Encounter (Signed)
Call placed to pt. To let him know that Dr. Fuller Plan is off today and as of right now I have not heard back from him about what to do about the medication but as soon as I hear back  some one will be in contact with him about with  doctor Stark instructions,pt. Stated okay thanks for calling letting me know.

## 2016-07-04 DIAGNOSIS — R310 Gross hematuria: Secondary | ICD-10-CM | POA: Diagnosis not present

## 2016-07-04 DIAGNOSIS — R300 Dysuria: Secondary | ICD-10-CM | POA: Diagnosis not present

## 2016-07-04 NOTE — Telephone Encounter (Signed)
LMOM that Dr. Fuller Plan is out of the office still and we will call pt as soon as Dr. Fuller Plan clarifies his medication

## 2016-07-05 NOTE — Telephone Encounter (Signed)
Reviewing at RN request while Dr. Fuller Plan is away  Please advise patient will check with Dr. Marlou Porch about an alternative medication to omeprazole but different from Zantac - do not think Zantac will be an adequate rx to treat his problem  Will consider pantoprazole 40 mg qd  ccing Dr. Marlou Porch

## 2016-07-05 NOTE — Telephone Encounter (Signed)
Call placed to pt. To inform him that I had Dr. Carlean Purl to evaluate concerns of Rx for  Omeprazole because Dr. Fuller Plan is still out of office,made him aware that Dr.Gessner has attempted to contact Dr. Marlou Porch to consult about medication and as soon as we get an answer we will contact him with recommendations,pt. Verbalize understanding,made him aware that it may next week before he hear back,he stated "okay".

## 2016-07-07 NOTE — Telephone Encounter (Signed)
Given erosive esophagitis and esophageal stricture we recommend a PPI as the appropriate medication. H2RA will not achieve and adequate result. Pantoprazole 40 mg daily long term is my recommendation in place of omeprazole. Please clear this with Dr. Marlou Porch.

## 2016-07-08 ENCOUNTER — Telehealth: Payer: Self-pay | Admitting: *Deleted

## 2016-07-08 DIAGNOSIS — R310 Gross hematuria: Secondary | ICD-10-CM | POA: Diagnosis not present

## 2016-07-08 MED ORDER — PANTOPRAZOLE SODIUM 40 MG PO TBEC: 40.0000 mg | DELAYED_RELEASE_TABLET | Freq: Every day | ORAL | 3 refills | Status: DC

## 2016-07-08 NOTE — Telephone Encounter (Signed)
I'm fine with pantoprazole from a cardiology perspective. Candee Furbish, MD

## 2016-07-08 NOTE — Telephone Encounter (Signed)
Call placed to pt. And spoke with wife and informed her that Dr. Fuller Plan recommends pantoprazole 40 mg daily long term,informed her that she needs to contact Dr. Marlou Porch to clear this medication with him,wife verbalize understanding and she stated that she was going to be in [place] today and she will stop by Dr. Marlou Porch office to see if it is okay for her husband to take,she instructed me to send Rx. Into [facility] in [place]  a 90 day supply.

## 2016-07-08 NOTE — Telephone Encounter (Signed)
Call placed to pt.and spoke with wife and informed her that Dr.Skains cleared her husband to take the pantopazole 40 mg daily,she verbalized understanding.

## 2016-07-08 NOTE — Telephone Encounter (Signed)
Thank you :)

## 2016-07-15 ENCOUNTER — Encounter: Payer: Self-pay | Admitting: Gastroenterology

## 2016-07-18 DIAGNOSIS — J069 Acute upper respiratory infection, unspecified: Secondary | ICD-10-CM | POA: Diagnosis not present

## 2016-08-06 NOTE — Progress Notes (Signed)
Cardiology Office Note    Date:  08/07/2016   ID:  [name], DOB Jul 11, 1950, MRN [id number]  PCP:  Lavone Orn, MD  Cardiologist:   Dr. Marlou Porch   CC: follow up  History of Present Illness:  [name] is a 66 y.o. male with a history of HTN, HLD, CAD s/p stent to LAD (2010) and GERD who presents to clinic for follow up.   He was last seen by Truitt Merle NP in 10/2015 for post hospital follow up after an admission for chest pain. He ruled out for MI and underwent nuclear stress test which was low risk. He was also noted to be bradycardic with HR is 40s but BB was continued since he was asymptomatic. His Lopressor 25 mg BID was later discontinued anyway.   Today he presents to clinic for follow up. He has been overall doing well. He had a UTI and was placed on Bactrim. His Valsartan/HCTZ was discontinued because his BP was running low. He has had some issues with memory recently and he thinks this is related to Lipitor. He discontinued this and his memory has improved. He takes his BP everyday and SBP running ~140-150s. 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 He denies any exertional chest pain or SOB. He had a lot of fatigue before his stent and he hasn't had any of that. No LE edema, orthopnea or PND. No dizziness or syncope. No blood in stool or urine. No palpitation. No LE edema pain with exertion.    Past Medical History:  Diagnosis Date  . Anxiety   . CAD (coronary artery disease)   . Coronary atherosclerosis of native coronary artery 01/29/2013  . Hx of seasonal allergies   . Hyperlipidemia   . Hypertension     Past Surgical History:  Procedure Laterality Date  . ANGIOPLASTY  2010   stent  . LUMBAR LAMINECTOMY  1991   L3 and T12    Current Medications: Outpatient Medications Prior to Visit  Medication Sig Dispense Refill  . aspirin EC 81 MG tablet Take 81 mg by mouth daily.    . Coenzyme Q10 (CO Q 10) 100 MG CAPS Take 200 mg by  mouth daily.    . diphenhydramine-acetaminophen (TYLENOL PM) 25-500 MG TABS tablet Take 1 tablet by mouth at bedtime.    . finasteride (PROPECIA) 1 MG tablet Take 1 mg by mouth daily.    Marland Kitchen gabapentin (NEURONTIN) 300 MG capsule Take 600 mg by mouth at bedtime. And may take an additional 300 mg once a day if needed    . nitroGLYCERIN (NITROSTAT) 0.4 MG SL tablet Place 1 tablet (0.4 mg total) under the tongue every 5 (five) minutes x 3 doses as needed for chest pain. 30 tablet 0  . pantoprazole (PROTONIX) 40 MG tablet Take 1 tablet (40 mg total) by mouth daily. 90 tablet 3  . Papaya CHEW Chew 1 tablet by mouth 2 (two) times daily as needed (for indigestion).    . Saw Palmetto 450 MG CAPS Take 1 capsule by mouth daily.    . traMADol (ULTRAM) 50 MG tablet Take 50 mg by mouth daily as needed (for pain).     Marland Kitchen acetaminophen (TYLENOL) 500 MG tablet Take 500 mg by mouth at bedtime.    Marland Kitchen atorvastatin (LIPITOR) 40 MG tablet Take 40 mg by mouth at bedtime.    . citalopram (CELEXA) 10 MG tablet Take 10 mg by mouth daily.    Marland Kitchen  omeprazole (PRILOSEC) 40 MG capsule Take 1 capsule (40 mg total) by mouth daily. 30 capsule 11  . valsartan-hydrochlorothiazide (DIOVAN HCT) 160-12.5 MG tablet Take 1 tablet by mouth daily. 90 tablet 3   Facility-Administered Medications Prior to Visit  Medication Dose Route Frequency Provider Last Rate Last Dose  . 0.9 %  sodium chloride infusion  500 mL Intravenous Continuous Ladene Artist, MD         Allergies:   Chicken allergy; Morphine; Other; Pineapple; and Naproxen   Social History   Social History  . Marital status: Married    Spouse name: N/A  . Number of children: N/A  . Years of education: N/A   Social History Main Topics  . Smoking status: Never Smoker  . Smokeless tobacco: Never Used  . Alcohol use Yes     Comment: beer once or twice a month  . Drug use: No  . Sexual activity: Not Asked   Other Topics Concern  . None   Social History Narrative  .  None     Family History:  The patient's family history includes Breast cancer in his mother; Lung cancer in his father.      ROS:   Please see the history of present illness.    ROS All other systems reviewed and are negative.   PHYSICAL EXAM:   VS:  BP 126/68   Pulse (!) 54   Ht 5\' 11"  (1.803 m)   Wt 192 lb (87.1 kg)   BMI 26.78 kg/m    GEN: Well nourished, well developed, in no acute distress  HEENT: normal  Neck: no JVD, carotid bruits, or masses Cardiac: RRR; no murmurs, rubs, or gallops,no edema  Respiratory:  clear to auscultation bilaterally, normal work of breathing GI: soft, nontender, nondistended, + BS MS: no deformity or atrophy  Skin: warm and dry, no rash Neuro:  Alert and Oriented x 3, Strength and sensation are intact Psych: euthymic mood, full affect   Wt Readings from Last 3 Encounters:  08/07/16 192 lb (87.1 kg)  07/02/16 197 lb (89.4 kg)  06/03/16 197 lb 9.6 oz (89.6 kg)      Studies/Labs Reviewed:   EKG:  EKG is ordered today.  The ekg ordered today demonstrates sinus bradycardia HR 54  Recent Labs: 10/30/2015: Hemoglobin 15.5; Platelets 122; TSH 1.617 11/30/2015: BUN 12; Creat 1.05; Potassium 3.9; Sodium 139   Lipid Panel    Component Value Date/Time   CHOL 150 10/31/2015 1137   TRIG 201 (H) 10/31/2015 1137   HDL 40 (L) 10/31/2015 1137   CHOLHDL 3.8 10/31/2015 1137   VLDL 40 10/31/2015 1137   [place] 70 10/31/2015 1137    Additional studies/ records that were reviewed today include:  Myoview Study Result from 10/2015   There was no ST segment deviation noted during stress.  No T wave inversion was noted during stress.  The study is normal.  Nuclear stress EF: 56%.  The left ventricular ejection fraction is normal (55-65%).  Normal stress nuclear study with no ischemia or infarction; EF 56 with normal wall motion.      ASSESSMENT & PLAN:   CAD: stable. He has stopped atorvastatin given issues with memory. No BB given  sinus bradycardia. He will continue on ASA 81mg  daily   Sinus brady: better off Metoprolol, HR 54  HTN: BP was 140/60 on my personal recheck. He says it runs higher at home. He is currently on no antihypertensives. Will start Valsartan 40mg  daily. Will  check a BMET in 2 weeks. He will call us if BP still running high and I will increase Valsartan to 80mg   GERD: continue protonix  HLD: LDL was 70 last fall, but now off atorva. We discussed possibly starting Zetia but he would like to wait on this  Thrombocytopenia: chronic. Continue follow up with PCP    Medication Adjustments/Labs and Tests Ordered: Current medicines are reviewed at length with the patient today.  Concerns regarding medicines are outlined above.  Medication changes, Labs and Tests ordered today are listed in the Patient Instructions below. Patient Instructions  Medication Instructions:  Your physician has recommended you make the following change in your medication:  1.  START Valsartan 40 mg taking 1 daily   Labwork: 2 WEEKS:  BMET  Testing/Procedures: None ordered  Follow-Up: Your physician recommends that you schedule a follow-up appointment in: 3-4 MONTHS WITH DR. Marlou Porch   Any Other Special Instructions Will Be Listed Below (If Applicable).  Your physician has requested that you regularly monitor and record your blood pressure readings at home. Please use the same machine at the same time of day to check your readings and record them to bring to your follow-up visit. If you notice that it is continually running over 140/90, please call our office.   If you need a refill on your cardiac medications before your next appointment, please call your pharmacy.      Signed, Angelena Form, PA-C  08/07/2016 4:23 PM    [facility] [place], [place], [place]  [address] Phone: [phone]; Fax: [phone]

## 2016-08-07 ENCOUNTER — Ambulatory Visit (INDEPENDENT_AMBULATORY_CARE_PROVIDER_SITE_OTHER): Payer: Medicare Other | Admitting: Physician Assistant

## 2016-08-07 ENCOUNTER — Encounter: Payer: Self-pay | Admitting: Physician Assistant

## 2016-08-07 VITALS — BP 126/68 | HR 54 | Ht 71.0 in | Wt 192.0 lb

## 2016-08-07 DIAGNOSIS — E785 Hyperlipidemia, unspecified: Secondary | ICD-10-CM

## 2016-08-07 DIAGNOSIS — I2510 Atherosclerotic heart disease of native coronary artery without angina pectoris: Secondary | ICD-10-CM

## 2016-08-07 DIAGNOSIS — K219 Gastro-esophageal reflux disease without esophagitis: Secondary | ICD-10-CM | POA: Diagnosis not present

## 2016-08-07 DIAGNOSIS — D696 Thrombocytopenia, unspecified: Secondary | ICD-10-CM | POA: Diagnosis not present

## 2016-08-07 DIAGNOSIS — I10 Essential (primary) hypertension: Secondary | ICD-10-CM

## 2016-08-07 DIAGNOSIS — Z9861 Coronary angioplasty status: Secondary | ICD-10-CM | POA: Diagnosis not present

## 2016-08-07 MED ORDER — VALSARTAN 40 MG PO TABS: 40.0000 mg | ORAL_TABLET | Freq: Every day | ORAL | 1 refills | Status: DC

## 2016-08-07 NOTE — Patient Instructions (Addendum)
Medication Instructions:  Your physician has recommended you make the following change in your medication:  1.  START Valsartan 40 mg taking 1 daily   Labwork: 2 WEEKS:  BMET  Testing/Procedures: None ordered  Follow-Up: Your physician recommends that you schedule a follow-up appointment in: 3-4 MONTHS WITH DR. Marlou Porch   Any Other Special Instructions Will Be Listed Below (If Applicable).  Your physician has requested that you regularly monitor and record your blood pressure readings at home. Please use the same machine at the same time of day to check your readings and record them to bring to your follow-up visit. If you notice that it is continually running over 140/90, please call our office.   If you need a refill on your cardiac medications before your next appointment, please call your pharmacy.

## 2016-08-19 ENCOUNTER — Other Ambulatory Visit: Payer: Medicare Other | Admitting: *Deleted

## 2016-08-19 DIAGNOSIS — I2510 Atherosclerotic heart disease of native coronary artery without angina pectoris: Secondary | ICD-10-CM | POA: Diagnosis not present

## 2016-08-19 DIAGNOSIS — Z9861 Coronary angioplasty status: Secondary | ICD-10-CM

## 2016-08-19 DIAGNOSIS — K219 Gastro-esophageal reflux disease without esophagitis: Secondary | ICD-10-CM | POA: Diagnosis not present

## 2016-08-19 DIAGNOSIS — I10 Essential (primary) hypertension: Secondary | ICD-10-CM | POA: Diagnosis not present

## 2016-08-19 DIAGNOSIS — E785 Hyperlipidemia, unspecified: Secondary | ICD-10-CM | POA: Diagnosis not present

## 2016-08-19 LAB — BASIC METABOLIC PANEL
BUN/Creatinine Ratio: 12 (ref 10–24)
BUN: 12 mg/dL (ref 8–27)
CO2: 23 mmol/L (ref 20–29)
Calcium: 8.6 mg/dL (ref 8.6–10.2)
Chloride: 103 mmol/L (ref 96–106)
Creatinine, Ser: 1.02 mg/dL (ref 0.76–1.27)
GFR calc Af Amer: 87 mL/min/{1.73_m2} (ref >59)
GFR calc non Af Amer: 75 mL/min/{1.73_m2} (ref >59)
Glucose: 132 mg/dL — ABNORMAL HIGH (ref 65–99)
Potassium: 4.2 mmol/L (ref 3.5–5.2)
Sodium: 140 mmol/L (ref 134–144)

## 2016-10-07 DIAGNOSIS — R21 Rash and other nonspecific skin eruption: Secondary | ICD-10-CM | POA: Diagnosis not present

## 2016-10-07 DIAGNOSIS — T63791A Toxic effect of contact with other venomous plant, accidental (unintentional), initial encounter: Secondary | ICD-10-CM | POA: Diagnosis not present

## 2016-10-24 DIAGNOSIS — R310 Gross hematuria: Secondary | ICD-10-CM | POA: Diagnosis not present

## 2016-12-17 ENCOUNTER — Other Ambulatory Visit: Payer: Self-pay | Admitting: Internal Medicine

## 2016-12-17 ENCOUNTER — Ambulatory Visit
Admission: RE | Admit: 2016-12-17 | Discharge: 2016-12-17 | Disposition: A | Discharge status: Home / Self Care | Payer: Medicare Other | Source: Ambulatory Visit | Attending: Internal Medicine | Admitting: Internal Medicine

## 2016-12-17 DIAGNOSIS — S3992XA Unspecified injury of lower back, initial encounter: Secondary | ICD-10-CM | POA: Diagnosis not present

## 2016-12-17 DIAGNOSIS — M549 Dorsalgia, unspecified: Secondary | ICD-10-CM

## 2016-12-17 DIAGNOSIS — R0781 Pleurodynia: Secondary | ICD-10-CM

## 2016-12-17 DIAGNOSIS — R079 Chest pain, unspecified: Secondary | ICD-10-CM | POA: Diagnosis not present

## 2016-12-17 DIAGNOSIS — M545 Low back pain: Secondary | ICD-10-CM | POA: Diagnosis not present

## 2016-12-17 DIAGNOSIS — S299XXA Unspecified injury of thorax, initial encounter: Secondary | ICD-10-CM | POA: Diagnosis not present

## 2017-01-07 ENCOUNTER — Other Ambulatory Visit: Payer: Self-pay | Admitting: Physician Assistant

## 2017-01-08 DIAGNOSIS — R1084 Generalized abdominal pain: Secondary | ICD-10-CM | POA: Diagnosis not present

## 2017-01-08 DIAGNOSIS — Z125 Encounter for screening for malignant neoplasm of prostate: Secondary | ICD-10-CM | POA: Diagnosis not present

## 2017-01-13 ENCOUNTER — Other Ambulatory Visit: Payer: Self-pay | Admitting: *Deleted

## 2017-01-13 MED ORDER — VALSARTAN 80 MG PO TABS: 40.0000 mg | ORAL_TABLET | Freq: Every day | ORAL | 1 refills | Status: DC

## 2017-01-13 NOTE — Telephone Encounter (Signed)
Per call from pharmacist at [facility], valsartan 40 mg tabs are unavailable but they have the 80 mg in stock. They are requesting an rx for the 80 mg tabs for the patient to take one-half tab qd. I will send in rx as requested.

## 2017-01-30 ENCOUNTER — Telehealth: Payer: Self-pay | Admitting: Cardiology

## 2017-01-30 NOTE — Telephone Encounter (Signed)
Spoke with pt and pt has several b/p recordings will bring with him tomorrow to visit for review ./cy

## 2017-01-30 NOTE — Telephone Encounter (Signed)
Pt c/o BP issue: STAT if pt c/o blurred vision, one-sided weakness or slurred speech  1. What are your last 5 BP readings?  Today 182/68  2. Are you having any other symptoms (ex. Dizziness, headache, blurred vision, passed out)?  Headaches   3. What is your BP issue? Patient believes his BP is too high and would like to change bp medications.   Patient scheduled to see Dr. Marlou Porch 01-31-17 @9 :20.

## 2017-01-30 NOTE — Telephone Encounter (Signed)
Attempted to call pt and recording states is not accepting phone calls unable to leave message.Will try later .Adonis Housekeeper

## 2017-01-31 ENCOUNTER — Ambulatory Visit (INDEPENDENT_AMBULATORY_CARE_PROVIDER_SITE_OTHER): Payer: Medicare Other | Admitting: Cardiology

## 2017-01-31 ENCOUNTER — Encounter: Payer: Self-pay | Admitting: Cardiology

## 2017-01-31 VITALS — BP 140/90 | HR 57 | Ht 71.0 in | Wt 200.4 lb

## 2017-01-31 DIAGNOSIS — R001 Bradycardia, unspecified: Secondary | ICD-10-CM

## 2017-01-31 DIAGNOSIS — I10 Essential (primary) hypertension: Secondary | ICD-10-CM

## 2017-01-31 DIAGNOSIS — I2510 Atherosclerotic heart disease of native coronary artery without angina pectoris: Secondary | ICD-10-CM

## 2017-01-31 DIAGNOSIS — Z9861 Coronary angioplasty status: Secondary | ICD-10-CM | POA: Diagnosis not present

## 2017-01-31 MED ORDER — VALSARTAN 80 MG PO TABS: 80.0000 mg | ORAL_TABLET | Freq: Every day | ORAL | 3 refills | Status: DC

## 2017-01-31 NOTE — Patient Instructions (Signed)
Medication Instructions:  1. INCREASE VALSARTAN TO 80 MG DAILY; NEW RX HAS BEEN SENT IN  Labwork: NONE ORDERED TODAY  Testing/Procedures: NONE ORDERED   Follow-Up: Your physician wants you to follow-up in: [place] will receive a reminder letter in the mail two months in advance. If you don't receive a letter, please call our office to schedule the follow-up appointment.   Any Other Special Instructions Will Be Listed Below (If Applicable).     If you need a refill on your cardiac medications before your next appointment, please call your pharmacy.

## 2017-01-31 NOTE — Progress Notes (Signed)
[facility]. [address] [place], [place]  [address] Phone: [phone] Fax:  [phone]  Date:  01/31/2017   ID:  [name], DOB [birth year], MRN [id number]  PCP:  Lavone Orn, MD   History of Present Illness: [name] is a 66 y.o. male with coronary artery disease status post drug-eluting stent in May of 2010 to his proximal LAD with EF of 60% here for followup.  Previously had an admission for chest pain in September 2017, nuclear stress test was low risk.  Bradycardia was noted in the 40s, asymptomatic, continued with low-dose beta-blocker.  He had a lot of fatigue before his stents but has not been noticing any of this.  Lipids last checked were excellent. We decided to discontinue his Zetia and change him to atorvastatin. Discussed the rationale for that.   Denies any chest pain/exertional chest pain. Occasionally while laying down will feel a twinge of left-sided chest discomfort. Fleeting. Enjoying his part-time job with [employer]. Went to [place]. One of the drivers has type 1 diabetes.   01/31/17: back pain has been the main issue.  He showed me his blood pressure readings and some of them are 1 60-170 range.  He was a bit worried about the valsartan.  We discussed.  I discussed as well with our pharmacy team.  I increased his valsartan.  Wt Readings from Last 3 Encounters:  01/31/17 200 lb 6.4 oz (90.9 kg)  08/07/16 192 lb (87.1 kg)  07/02/16 197 lb (89.4 kg)     Past Medical History:  Diagnosis Date  . Anxiety   . CAD (coronary artery disease)   . Coronary atherosclerosis of native coronary artery 01/29/2013  . Hx of seasonal allergies   . Hyperlipidemia   . Hypertension     Past Surgical History:  Procedure Laterality Date  . ANGIOPLASTY  2010   stent  . LUMBAR LAMINECTOMY  1991   L3 and T12    Current Outpatient Medications  Medication Sig Dispense Refill  . acetaminophen (TYLENOL) 500 MG tablet Take 500 mg by  mouth at bedtime.    Marland Kitchen aspirin EC 81 MG tablet Take 81 mg by mouth daily.    Marland Kitchen atorvastatin (LIPITOR) 40 MG tablet Take 20 mg by mouth daily.    . citalopram (CELEXA) 20 MG tablet Take 20 mg by mouth daily.    . Coenzyme Q10 (CO Q 10) 100 MG CAPS Take 200 mg by mouth daily.    . diphenhydramine-acetaminophen (TYLENOL PM) 25-500 MG TABS tablet Take 1 tablet by mouth at bedtime.    . finasteride (PROPECIA) 1 MG tablet Take 1 mg by mouth daily.    Marland Kitchen gabapentin (NEURONTIN) 300 MG capsule Take 600 mg by mouth at bedtime. And may take an additional 300 mg once a day if needed    . nitroGLYCERIN (NITROSTAT) 0.4 MG SL tablet Place 1 tablet (0.4 mg total) under the tongue every 5 (five) minutes x 3 doses as needed for chest pain. 30 tablet 0  . pantoprazole (PROTONIX) 40 MG tablet Take 1 tablet (40 mg total) by mouth daily. 90 tablet 3  . Papaya CHEW Chew 1 tablet by mouth 2 (two) times daily as needed (for indigestion).    . Saw Palmetto 450 MG CAPS Take 1 capsule by mouth daily.    . tamsulosin (FLOMAX) 0.4 MG CAPS capsule Take 0.4 mg by mouth at bedtime.    . traMADol Veatrice Bourbon)  50 MG tablet Take 50 mg by mouth daily as needed (for pain).     . valsartan (DIOVAN) 80 MG tablet Take 1 tablet (80 mg total) by mouth daily. 90 tablet 3   Current Facility-Administered Medications  Medication Dose Route Frequency Provider Last Rate Last Dose  . 0.9 %  sodium chloride infusion  500 mL Intravenous Continuous Ladene Artist, MD        Allergies:    Allergies  Allergen Reactions  . Chicken Allergy Shortness Of Breath, Nausea And Vomiting and Other (See Comments)    Headache SAME REACTION(S) TO [place]!!!!  . Morphine Shortness Of Breath  . Other Anaphylaxis    Ate a fruit salad and had to go to the E.R.  . Pineapple Anaphylaxis and Rash  . Naproxen Rash    Social History:  The patient  reports that  has never smoked. he has never used smokeless tobacco. He reports that he drinks alcohol. He reports  that he does not use drugs.   ROS:  Please see the history of present illness.  No chest pain shortness of breath fever syncope. PHYSICAL EXAM: VS:  BP 140/90   Pulse (!) 57   Ht 5\' 11"  (1.803 m)   Wt 200 lb 6.4 oz (90.9 kg)   SpO2 97%   BMI 27.95 kg/m  GEN: Well nourished, well developed, in no acute distress  HEENT: normal  Neck: no JVD, carotid bruits, or masses Cardiac: RRR; no murmurs, rubs, or gallops,no edema  Respiratory:  clear to auscultation bilaterally, normal work of breathing GI: soft, nontender, nondistended, + BS MS: no deformity or atrophy  Skin: warm and dry, no rash Neuro:  Alert and Oriented x 3, Strength and sensation are intact Psych: euthymic mood, full affect noted  EKG:  Sinus bradycardia rate 53 with no other abnormalities.     ASSESSMENT AND PLAN:  Coronary artery disease-currently doing well, no exertional anginal symptoms. Reviewed anatomy as above. Aggressive secondary prevention.  Angina-currently well controlled. No changes  Hyperlipidemia-currently on atorvastatin.  Essential hypertension- blood pressure is quite elevated recently.  I will increase his Diovan/valsartan to 80 mg once a day.  He was taking half of this tablet.  Signed, Candee Furbish, MD [facility]  01/31/2017 10:39 AM

## 2017-02-03 DIAGNOSIS — H2511 Age-related nuclear cataract, right eye: Secondary | ICD-10-CM | POA: Diagnosis not present

## 2017-02-20 DIAGNOSIS — H2511 Age-related nuclear cataract, right eye: Secondary | ICD-10-CM | POA: Diagnosis not present

## 2017-02-20 DIAGNOSIS — Z961 Presence of intraocular lens: Secondary | ICD-10-CM | POA: Diagnosis not present

## 2017-02-21 DIAGNOSIS — M5416 Radiculopathy, lumbar region: Secondary | ICD-10-CM | POA: Diagnosis not present

## 2017-02-21 DIAGNOSIS — M791 Myalgia, unspecified site: Secondary | ICD-10-CM | POA: Diagnosis not present

## 2017-02-21 DIAGNOSIS — M545 Low back pain: Secondary | ICD-10-CM | POA: Diagnosis not present

## 2017-02-28 DIAGNOSIS — R310 Gross hematuria: Secondary | ICD-10-CM | POA: Diagnosis not present

## 2017-02-28 DIAGNOSIS — N410 Acute prostatitis: Secondary | ICD-10-CM | POA: Diagnosis not present

## 2017-03-10 DIAGNOSIS — H2511 Age-related nuclear cataract, right eye: Secondary | ICD-10-CM | POA: Diagnosis not present

## 2017-03-10 DIAGNOSIS — H25811 Combined forms of age-related cataract, right eye: Secondary | ICD-10-CM | POA: Diagnosis not present

## 2017-03-17 DIAGNOSIS — H2511 Age-related nuclear cataract, right eye: Secondary | ICD-10-CM | POA: Diagnosis not present

## 2017-03-18 DIAGNOSIS — M5416 Radiculopathy, lumbar region: Secondary | ICD-10-CM | POA: Diagnosis not present

## 2017-03-18 DIAGNOSIS — M47816 Spondylosis without myelopathy or radiculopathy, lumbar region: Secondary | ICD-10-CM | POA: Diagnosis not present

## 2017-03-18 DIAGNOSIS — M4856XA Collapsed vertebra, not elsewhere classified, lumbar region, initial encounter for fracture: Secondary | ICD-10-CM | POA: Diagnosis not present

## 2017-04-03 DIAGNOSIS — H2511 Age-related nuclear cataract, right eye: Secondary | ICD-10-CM | POA: Diagnosis not present

## 2017-04-18 DIAGNOSIS — N410 Acute prostatitis: Secondary | ICD-10-CM | POA: Diagnosis not present

## 2017-04-18 DIAGNOSIS — N481 Balanitis: Secondary | ICD-10-CM | POA: Diagnosis not present

## 2017-05-09 ENCOUNTER — Telehealth: Payer: Self-pay | Admitting: Cardiology

## 2017-05-09 NOTE — Telephone Encounter (Signed)
New Message:    Pt has concerns about taking valsartan (DIOVAN) 80 MG tablet due to the things be said on tv. Pt would has some questions on whether they should still be taking this medication

## 2017-05-09 NOTE — Telephone Encounter (Signed)
Advised pt to contact his pharmacy to make sure his medication was not filled with one of the manufactures that was recalled.  Pt states understanding and will contact the pharmacy.

## 2017-05-16 ENCOUNTER — Ambulatory Visit
Admission: RE | Admit: 2017-05-16 | Discharge: 2017-05-16 | Disposition: A | Discharge status: Home / Self Care | Payer: Medicare Other | Source: Ambulatory Visit | Attending: Internal Medicine | Admitting: Internal Medicine

## 2017-05-16 ENCOUNTER — Other Ambulatory Visit: Payer: Self-pay | Admitting: Internal Medicine

## 2017-05-16 DIAGNOSIS — M25561 Pain in right knee: Secondary | ICD-10-CM

## 2017-05-16 DIAGNOSIS — Z1389 Encounter for screening for other disorder: Secondary | ICD-10-CM | POA: Diagnosis not present

## 2017-05-16 DIAGNOSIS — I10 Essential (primary) hypertension: Secondary | ICD-10-CM | POA: Diagnosis not present

## 2017-05-16 DIAGNOSIS — M5136 Other intervertebral disc degeneration, lumbar region: Secondary | ICD-10-CM | POA: Diagnosis not present

## 2017-05-16 DIAGNOSIS — M1711 Unilateral primary osteoarthritis, right knee: Secondary | ICD-10-CM | POA: Diagnosis not present

## 2017-05-16 DIAGNOSIS — R7301 Impaired fasting glucose: Secondary | ICD-10-CM | POA: Diagnosis not present

## 2017-05-16 DIAGNOSIS — F325 Major depressive disorder, single episode, in full remission: Secondary | ICD-10-CM | POA: Diagnosis not present

## 2017-05-16 DIAGNOSIS — N400 Enlarged prostate without lower urinary tract symptoms: Secondary | ICD-10-CM | POA: Diagnosis not present

## 2017-05-16 DIAGNOSIS — Z0000 Encounter for general adult medical examination without abnormal findings: Secondary | ICD-10-CM | POA: Diagnosis not present

## 2017-05-16 DIAGNOSIS — K219 Gastro-esophageal reflux disease without esophagitis: Secondary | ICD-10-CM | POA: Diagnosis not present

## 2017-05-16 DIAGNOSIS — Z7189 Other specified counseling: Secondary | ICD-10-CM | POA: Diagnosis not present

## 2017-05-16 DIAGNOSIS — E782 Mixed hyperlipidemia: Secondary | ICD-10-CM | POA: Diagnosis not present

## 2017-05-16 DIAGNOSIS — I2510 Atherosclerotic heart disease of native coronary artery without angina pectoris: Secondary | ICD-10-CM | POA: Diagnosis not present

## 2017-06-03 DIAGNOSIS — N410 Acute prostatitis: Secondary | ICD-10-CM | POA: Diagnosis not present

## 2017-06-03 DIAGNOSIS — N481 Balanitis: Secondary | ICD-10-CM | POA: Diagnosis not present

## 2017-06-05 DIAGNOSIS — M25561 Pain in right knee: Secondary | ICD-10-CM | POA: Diagnosis not present

## 2017-06-05 DIAGNOSIS — M5416 Radiculopathy, lumbar region: Secondary | ICD-10-CM | POA: Diagnosis not present

## 2017-06-23 DIAGNOSIS — R51 Headache: Secondary | ICD-10-CM | POA: Diagnosis not present

## 2017-06-23 DIAGNOSIS — J0100 Acute maxillary sinusitis, unspecified: Secondary | ICD-10-CM | POA: Diagnosis not present

## 2017-10-23 DIAGNOSIS — L570 Actinic keratosis: Secondary | ICD-10-CM | POA: Diagnosis not present

## 2017-10-23 DIAGNOSIS — L819 Disorder of pigmentation, unspecified: Secondary | ICD-10-CM | POA: Diagnosis not present

## 2017-11-05 ENCOUNTER — Encounter: Payer: Self-pay | Admitting: Cardiology

## 2017-11-05 ENCOUNTER — Ambulatory Visit (INDEPENDENT_AMBULATORY_CARE_PROVIDER_SITE_OTHER): Payer: Medicare Other | Admitting: Cardiology

## 2017-11-05 VITALS — BP 132/68 | HR 51 | Ht 71.0 in | Wt 189.0 lb

## 2017-11-05 DIAGNOSIS — E785 Hyperlipidemia, unspecified: Secondary | ICD-10-CM | POA: Diagnosis not present

## 2017-11-05 DIAGNOSIS — I2510 Atherosclerotic heart disease of native coronary artery without angina pectoris: Secondary | ICD-10-CM | POA: Diagnosis not present

## 2017-11-05 DIAGNOSIS — I10 Essential (primary) hypertension: Secondary | ICD-10-CM

## 2017-11-05 DIAGNOSIS — Z9861 Coronary angioplasty status: Secondary | ICD-10-CM | POA: Diagnosis not present

## 2017-11-05 DIAGNOSIS — I209 Angina pectoris, unspecified: Secondary | ICD-10-CM | POA: Diagnosis not present

## 2017-11-05 NOTE — Patient Instructions (Signed)
Medication Instructions:  Your physician recommends that you continue on your current medications as directed. Please refer to the Current Medication list given to you today.   Labwork: none  Testing/Procedures: none  Follow-Up: Your physician wants you to follow-up in: 12 months.  You will receive a reminder letter in the mail two months in advance. If you don't receive a letter, please call our office to schedule the follow-up appointment.   Any Other Special Instructions Will Be Listed Below (If Applicable).  Dr. Marlou Porch would like you to get an Omron blood pressure cuff   If you need a refill on your cardiac medications before your next appointment, please call your pharmacy.

## 2017-11-05 NOTE — Progress Notes (Signed)
[facility]. [address], [address] [place], [place]  [address] Phone: [phone] Fax:  [phone]  Date:  11/05/2017   ID:  [name], DOB 1950-04-30, MRN [id number]  PCP:  Lavone Orn, MD   History of Present Illness: [name] is a 67 y.o. male with coronary artery disease status post drug-eluting stent in May of 2010 to his proximal LAD with EF of 60% here for followup.  Previously had an admission for chest pain in September 2017, nuclear stress test was low risk.  Bradycardia was noted in the 40s, asymptomatic, continued with low-dose beta-blocker.  He had a lot of fatigue before his stents but has not been noticing any of this.  Lipids last checked were excellent. We decided to discontinue his Zetia and change him to atorvastatin. Discussed the rationale for that.  Denies any chest pain/exertional chest pain. Occasionally while laying down will feel a twinge of left-sided chest discomfort. Fleeting. Enjoying his part-time job with [employer]. Went to [place]. One of the drivers has type 1 diabetes.   01/31/17: back pain has been the main issue.  He showed me his blood pressure readings and some of them are 1 60-170 range.  He was a bit worried about the valsartan.  We discussed.  I discussed as well with our pharmacy team.  I increased his valsartan.  11/05/2017-chief complaint coronary artery disease follow-up, prior severe proximal LAD disease discovered in 2010 post stent..  Overall he has been doing quite well.  Still having some back discomfort.  Blood pressure reviewed.  Denies any fevers chills nausea vomiting syncope bleeding.  Wt Readings from Last 3 Encounters:  11/05/17 189 lb (85.7 kg)  01/31/17 200 lb 6.4 oz (90.9 kg)  08/07/16 192 lb (87.1 kg)     Past Medical History:  Diagnosis Date  . Anxiety   . CAD (coronary artery disease)   . Coronary atherosclerosis of native coronary artery 01/29/2013  . Hx of seasonal allergies   .  Hyperlipidemia   . Hypertension     Past Surgical History:  Procedure Laterality Date  . ANGIOPLASTY  2010   stent  . LUMBAR LAMINECTOMY  1991   L3 and T12    Current Outpatient Medications  Medication Sig Dispense Refill  . acetaminophen (TYLENOL) 500 MG tablet Take 500 mg by mouth at bedtime.    Marland Kitchen aspirin EC 81 MG tablet Take 81 mg by mouth daily.    Marland Kitchen atorvastatin (LIPITOR) 40 MG tablet Take 40 mg by mouth daily.     . citalopram (CELEXA) 10 MG tablet Take 10 mg by mouth daily.    . Coenzyme Q10 (CO Q 10) 100 MG CAPS Take 200 mg by mouth daily.    . diphenhydramine-acetaminophen (TYLENOL PM) 25-500 MG TABS tablet Take 1 tablet by mouth at bedtime.    . finasteride (PROSCAR) 5 MG tablet finasteride 5 mg tablet    . gabapentin (NEURONTIN) 300 MG capsule Take 600 mg by mouth at bedtime. And may take an additional 300 mg once a day if needed    . nitroGLYCERIN (NITROSTAT) 0.4 MG SL tablet Place 1 tablet (0.4 mg total) under the tongue every 5 (five) minutes x 3 doses as needed for chest pain. 30 tablet 0  . pantoprazole (PROTONIX) 40 MG tablet Take 1 tablet (40 mg total) by mouth daily. 90 tablet 3  . Papaya CHEW Chew 1 tablet by mouth 2 (two) times daily  as needed (for indigestion).    . Saw Palmetto 450 MG CAPS Take 1 capsule by mouth daily.    . tamsulosin (FLOMAX) 0.4 MG CAPS capsule Take 0.4 mg by mouth at bedtime.    . traMADol (ULTRAM) 50 MG tablet Take 50 mg by mouth daily as needed (for pain).     . valsartan (DIOVAN) 80 MG tablet Take 1 tablet (80 mg total) by mouth daily. 90 tablet 3   Current Facility-Administered Medications  Medication Dose Route Frequency Provider Last Rate Last Dose  . 0.9 %  sodium chloride infusion  500 mL Intravenous Continuous Ladene Artist, MD        Allergies:    Allergies  Allergen Reactions  . Chicken Allergy Shortness Of Breath, Nausea And Vomiting and Other (See Comments)    Headache SAME REACTION(S) TO [place]!!!!  . Morphine  Shortness Of Breath  . Other Anaphylaxis    Ate a fruit salad and had to go to the E.R.  . Pineapple Anaphylaxis and Rash  . Sulfa Antibiotics Shortness Of Breath    Headache SAME REACTION(S) TO [place]!!!!  . Naproxen Rash    Social History:  The patient  reports that he has never smoked. He has never used smokeless tobacco. He reports that he drinks alcohol. He reports that he does not use drugs.   ROS:  Please see the history of present illness.  All other review of systems negative PHYSICAL EXAM: VS:  BP 132/68   Pulse (!) 51   Ht 5\' 11"  (1.803 m)   Wt 189 lb (85.7 kg)   SpO2 96%   BMI 26.36 kg/m  GEN: Well nourished, well developed, in no acute distress  HEENT: normal  Neck: no JVD, carotid bruits, or masses Cardiac: RRR; no murmurs, rubs, or gallops,no edema  Respiratory:  clear to auscultation bilaterally, normal work of breathing GI: soft, nontender, nondistended, + BS MS: no deformity or atrophy  Skin: warm and dry, no rash Neuro:  Alert and Oriented x 3, Strength and sensation are intact Psych: euthymic mood, full affect   EKG: 11/05/2017-sinus bradycardia 51 small Q waves aVF, personally reviewed and interpreted.  No other ischemic changes.  Sinus bradycardia rate 53 with no other abnormalities.     ASSESSMENT AND PLAN:  Coronary artery disease -Overall doing well without any exertional anginal symptoms.  Aggressive secondary prevention once again discussed.  Medications reviewed.  Angina -No recent episodes of angina, seems to be well controlled especially post PCI.  Nitroglycerin as needed  Hyperlipidemia currently on atorvastatin.  No myalgias, LDL goal less than 70  Essential hypertension - We have had trouble in the past with blood pressures.  Medications once again reviewed.  At last visit increased his valsartan to 80.  His blood pressures at home on his older blood pressure cuff have been in the 150s at times.  I have asked him to purchase a new Omron  blood pressure cuff.  He will let us know the results.  If necessary, we can increase his valsartan ~160.  Remember, in the past he had battled with some hypotension.  Signed, Candee Furbish, MD [facility]  11/05/2017 10:23 AM

## 2017-11-23 ENCOUNTER — Other Ambulatory Visit: Payer: Self-pay | Admitting: Gastroenterology

## 2017-11-23 DIAGNOSIS — K2210 Ulcer of esophagus without bleeding: Secondary | ICD-10-CM

## 2017-11-25 ENCOUNTER — Telehealth: Payer: Self-pay | Admitting: Cardiology

## 2017-11-25 NOTE — Telephone Encounter (Signed)
° ° ° °*  STAT* If patient is at the pharmacy, call can be transferred to refill team.   1. Which medications need to be refilled? (please list name of each medication and dose if known) pantoprazole (PROTONIX) 40 MG tablet,   2. Which pharmacy/location (including street and city if local pharmacy) is medication to be sent to?[facility], [place] - [address]  3. Do they need a 30 day or 90 day supply? [place]

## 2017-11-25 NOTE — Telephone Encounter (Signed)
Called pt to informed him that, Ladene Artist, MD, prescribed this medication and he needed to request a refill with him. Pt verbalized understanding.

## 2017-12-25 ENCOUNTER — Ambulatory Visit: Payer: Medicare Other | Admitting: Gastroenterology

## 2017-12-30 ENCOUNTER — Ambulatory Visit (INDEPENDENT_AMBULATORY_CARE_PROVIDER_SITE_OTHER): Payer: Medicare Other | Admitting: Gastroenterology

## 2017-12-30 ENCOUNTER — Encounter: Payer: Self-pay | Admitting: Gastroenterology

## 2017-12-30 VITALS — BP 124/60 | HR 60 | Ht 69.25 in | Wt 191.5 lb

## 2017-12-30 DIAGNOSIS — I2510 Atherosclerotic heart disease of native coronary artery without angina pectoris: Secondary | ICD-10-CM

## 2017-12-30 DIAGNOSIS — Z9861 Coronary angioplasty status: Secondary | ICD-10-CM

## 2017-12-30 DIAGNOSIS — Z86010 Personal history of colonic polyps: Secondary | ICD-10-CM | POA: Diagnosis not present

## 2017-12-30 DIAGNOSIS — K2100 Gastro-esophageal reflux disease with esophagitis, without bleeding: Secondary | ICD-10-CM

## 2017-12-30 DIAGNOSIS — K2210 Ulcer of esophagus without bleeding: Secondary | ICD-10-CM

## 2017-12-30 DIAGNOSIS — Z860101 Personal history of adenomatous and serrated colon polyps: Secondary | ICD-10-CM

## 2017-12-30 MED ORDER — PANTOPRAZOLE SODIUM 40 MG PO TBEC: 40.0000 mg | DELAYED_RELEASE_TABLET | Freq: Every day | ORAL | 3 refills | Status: DC

## 2017-12-30 NOTE — Progress Notes (Signed)
    History of Present Illness: This is a 67 year old male with a history of GERD, erosive esophagitis and esophageal stricture.  He is accompanied by his wife.  He was maintained on pantoprazole 40 mg daily and his reflux was under excellent control.  His prescription expired and he notes a return in reflux symptoms.  No dysphagia.Marland Kitchen  EGD 06/2016 - LA Grade B reflux esophagitis. - Benign-appearing esophageal stenosis. Dilated. - Small hiatal hernia. - Normal second portion of the duodenum. - No specimens collected.  Current Medications, Allergies, Past Medical History, Past Surgical History, Family History and Social History were reviewed in [employer] record.  Physical Exam: General: Well developed, well nourished, no acute distress Head: Normocephalic and atraumatic Eyes:  sclerae anicteric, EOMI Ears: Normal auditory acuity Mouth: No deformity or lesions Lungs: Clear throughout to auscultation Heart: Regular rate and rhythm; no murmurs, rubs or bruits Abdomen: Soft, non tender and non distended. No masses, hepatosplenomegaly or hernias noted. Normal Bowel sounds Rectal: Not done Musculoskeletal: Symmetrical with no gross deformities  Pulses:  Normal pulses noted Extremities: No clubbing, cyanosis, edema or deformities noted Neurological: Alert oriented x 4, grossly nonfocal Psychological:  Alert and cooperative. Normal mood and affect   Assessment and Recommendations:  1.  GERD with erosive esophagitis and esophageal stricture.  Maintain pantoprazole 40 mg p.o. every morning and antireflux measures long-term.  Annual refills with me or by his PCP.  REV in 1 year if he requires refills from our office.   2.  Personal history of adenomatous colon polyps.  A 5-year interval surveillance colonoscopy is recommended in August 2023.

## 2017-12-30 NOTE — Patient Instructions (Signed)
We have sent the following medications to your pharmacy for you to pick up at your convenience:pantoprazole.   Normal BMI (Body Mass Index- based on height and weight) is between 23 and 30. Your BMI today is Body mass index is 28.08 kg/m. Marland Kitchen Please consider follow up  regarding your BMI with your Primary Care Provider.  Thank you for choosing me and [facility].  Pricilla Riffle. Dagoberto Ligas., MD., [facility]

## 2018-01-12 DIAGNOSIS — S161XXA Strain of muscle, fascia and tendon at neck level, initial encounter: Secondary | ICD-10-CM | POA: Diagnosis not present

## 2018-02-08 DIAGNOSIS — M549 Dorsalgia, unspecified: Secondary | ICD-10-CM | POA: Diagnosis present

## 2018-02-08 DIAGNOSIS — G8929 Other chronic pain: Secondary | ICD-10-CM | POA: Diagnosis not present

## 2018-02-08 DIAGNOSIS — N3091 Cystitis, unspecified with hematuria: Secondary | ICD-10-CM | POA: Diagnosis present

## 2018-02-08 DIAGNOSIS — B961 Klebsiella pneumoniae [K. pneumoniae] as the cause of diseases classified elsewhere: Secondary | ICD-10-CM | POA: Diagnosis not present

## 2018-02-08 DIAGNOSIS — N3090 Cystitis, unspecified without hematuria: Secondary | ICD-10-CM | POA: Diagnosis present

## 2018-02-08 DIAGNOSIS — N390 Urinary tract infection, site not specified: Secondary | ICD-10-CM | POA: Diagnosis not present

## 2018-02-08 DIAGNOSIS — I10 Essential (primary) hypertension: Secondary | ICD-10-CM | POA: Diagnosis not present

## 2018-02-08 DIAGNOSIS — R319 Hematuria, unspecified: Secondary | ICD-10-CM | POA: Diagnosis not present

## 2018-02-08 DIAGNOSIS — R51 Headache: Secondary | ICD-10-CM | POA: Diagnosis not present

## 2018-02-08 DIAGNOSIS — Z833 Family history of diabetes mellitus: Secondary | ICD-10-CM | POA: Diagnosis not present

## 2018-02-08 DIAGNOSIS — Z955 Presence of coronary angioplasty implant and graft: Secondary | ICD-10-CM | POA: Diagnosis not present

## 2018-02-08 DIAGNOSIS — R7881 Bacteremia: Secondary | ICD-10-CM | POA: Diagnosis not present

## 2018-02-08 DIAGNOSIS — A409 Streptococcal sepsis, unspecified: Secondary | ICD-10-CM | POA: Diagnosis not present

## 2018-02-08 DIAGNOSIS — Z967 Presence of other bone and tendon implants: Secondary | ICD-10-CM | POA: Diagnosis not present

## 2018-02-08 DIAGNOSIS — E1165 Type 2 diabetes mellitus with hyperglycemia: Secondary | ICD-10-CM | POA: Diagnosis present

## 2018-02-08 DIAGNOSIS — N3001 Acute cystitis with hematuria: Secondary | ICD-10-CM | POA: Diagnosis not present

## 2018-02-08 DIAGNOSIS — R42 Dizziness and giddiness: Secondary | ICD-10-CM | POA: Diagnosis not present

## 2018-02-08 DIAGNOSIS — A4159 Other Gram-negative sepsis: Secondary | ICD-10-CM | POA: Diagnosis not present

## 2018-02-08 DIAGNOSIS — N3000 Acute cystitis without hematuria: Secondary | ICD-10-CM | POA: Diagnosis not present

## 2018-02-08 DIAGNOSIS — I2510 Atherosclerotic heart disease of native coronary artery without angina pectoris: Secondary | ICD-10-CM | POA: Diagnosis not present

## 2018-02-08 DIAGNOSIS — A4150 Gram-negative sepsis, unspecified: Secondary | ICD-10-CM | POA: Diagnosis not present

## 2018-02-08 DIAGNOSIS — N400 Enlarged prostate without lower urinary tract symptoms: Secondary | ICD-10-CM | POA: Diagnosis not present

## 2018-02-08 DIAGNOSIS — A401 Sepsis due to streptococcus, group B: Secondary | ICD-10-CM | POA: Diagnosis not present

## 2018-02-08 DIAGNOSIS — B349 Viral infection, unspecified: Secondary | ICD-10-CM | POA: Diagnosis not present

## 2018-02-08 DIAGNOSIS — R509 Fever, unspecified: Secondary | ICD-10-CM | POA: Diagnosis not present

## 2018-02-08 DIAGNOSIS — E119 Type 2 diabetes mellitus without complications: Secondary | ICD-10-CM | POA: Diagnosis not present

## 2018-02-08 DIAGNOSIS — B950 Streptococcus, group A, as the cause of diseases classified elsewhere: Secondary | ICD-10-CM | POA: Diagnosis not present

## 2018-02-08 DIAGNOSIS — D696 Thrombocytopenia, unspecified: Secondary | ICD-10-CM | POA: Diagnosis not present

## 2018-02-08 DIAGNOSIS — F329 Major depressive disorder, single episode, unspecified: Secondary | ICD-10-CM | POA: Diagnosis not present

## 2018-02-08 DIAGNOSIS — R109 Unspecified abdominal pain: Secondary | ICD-10-CM | POA: Diagnosis not present

## 2018-02-17 DIAGNOSIS — A419 Sepsis, unspecified organism: Secondary | ICD-10-CM | POA: Diagnosis not present

## 2018-02-17 DIAGNOSIS — E1169 Type 2 diabetes mellitus with other specified complication: Secondary | ICD-10-CM | POA: Diagnosis not present

## 2018-02-17 DIAGNOSIS — I10 Essential (primary) hypertension: Secondary | ICD-10-CM | POA: Diagnosis not present

## 2018-02-17 DIAGNOSIS — Z7984 Long term (current) use of oral hypoglycemic drugs: Secondary | ICD-10-CM | POA: Diagnosis not present

## 2018-02-17 DIAGNOSIS — N390 Urinary tract infection, site not specified: Secondary | ICD-10-CM | POA: Diagnosis not present

## 2018-02-23 DIAGNOSIS — L819 Disorder of pigmentation, unspecified: Secondary | ICD-10-CM | POA: Diagnosis not present

## 2018-02-23 DIAGNOSIS — L570 Actinic keratosis: Secondary | ICD-10-CM | POA: Diagnosis not present

## 2018-02-23 DIAGNOSIS — D229 Melanocytic nevi, unspecified: Secondary | ICD-10-CM | POA: Diagnosis not present

## 2018-02-23 DIAGNOSIS — L821 Other seborrheic keratosis: Secondary | ICD-10-CM | POA: Diagnosis not present

## 2018-02-23 DIAGNOSIS — D1801 Hemangioma of skin and subcutaneous tissue: Secondary | ICD-10-CM | POA: Diagnosis not present

## 2018-02-23 DIAGNOSIS — L814 Other melanin hyperpigmentation: Secondary | ICD-10-CM | POA: Diagnosis not present

## 2018-02-26 DIAGNOSIS — N401 Enlarged prostate with lower urinary tract symptoms: Secondary | ICD-10-CM | POA: Diagnosis not present

## 2018-02-26 DIAGNOSIS — R3914 Feeling of incomplete bladder emptying: Secondary | ICD-10-CM | POA: Diagnosis not present

## 2018-03-03 DIAGNOSIS — E1169 Type 2 diabetes mellitus with other specified complication: Secondary | ICD-10-CM | POA: Diagnosis not present

## 2018-03-03 DIAGNOSIS — N400 Enlarged prostate without lower urinary tract symptoms: Secondary | ICD-10-CM | POA: Diagnosis not present

## 2018-03-03 DIAGNOSIS — Z87440 Personal history of urinary (tract) infections: Secondary | ICD-10-CM | POA: Diagnosis not present

## 2018-03-03 DIAGNOSIS — I10 Essential (primary) hypertension: Secondary | ICD-10-CM | POA: Diagnosis not present

## 2018-03-03 DIAGNOSIS — R300 Dysuria: Secondary | ICD-10-CM | POA: Diagnosis not present

## 2018-03-14 ENCOUNTER — Other Ambulatory Visit: Payer: Self-pay | Admitting: Cardiology

## 2018-04-28 DIAGNOSIS — R0789 Other chest pain: Secondary | ICD-10-CM | POA: Diagnosis not present

## 2018-04-28 DIAGNOSIS — J209 Acute bronchitis, unspecified: Secondary | ICD-10-CM | POA: Diagnosis not present

## 2018-04-30 DIAGNOSIS — R3912 Poor urinary stream: Secondary | ICD-10-CM | POA: Diagnosis not present

## 2018-04-30 DIAGNOSIS — N401 Enlarged prostate with lower urinary tract symptoms: Secondary | ICD-10-CM | POA: Diagnosis not present

## 2018-05-20 IMAGING — CR DG RIBS W/ CHEST 3+V*R*
3 series · 3 of 3 positions shown · non-contrast
Comparison: None.

CLINICAL DATA: Recent fall with right-sided chest pain, initial
encounter

EXAM:
RIGHT RIBS AND CHEST - 3+ VIEW

[w chest pa]
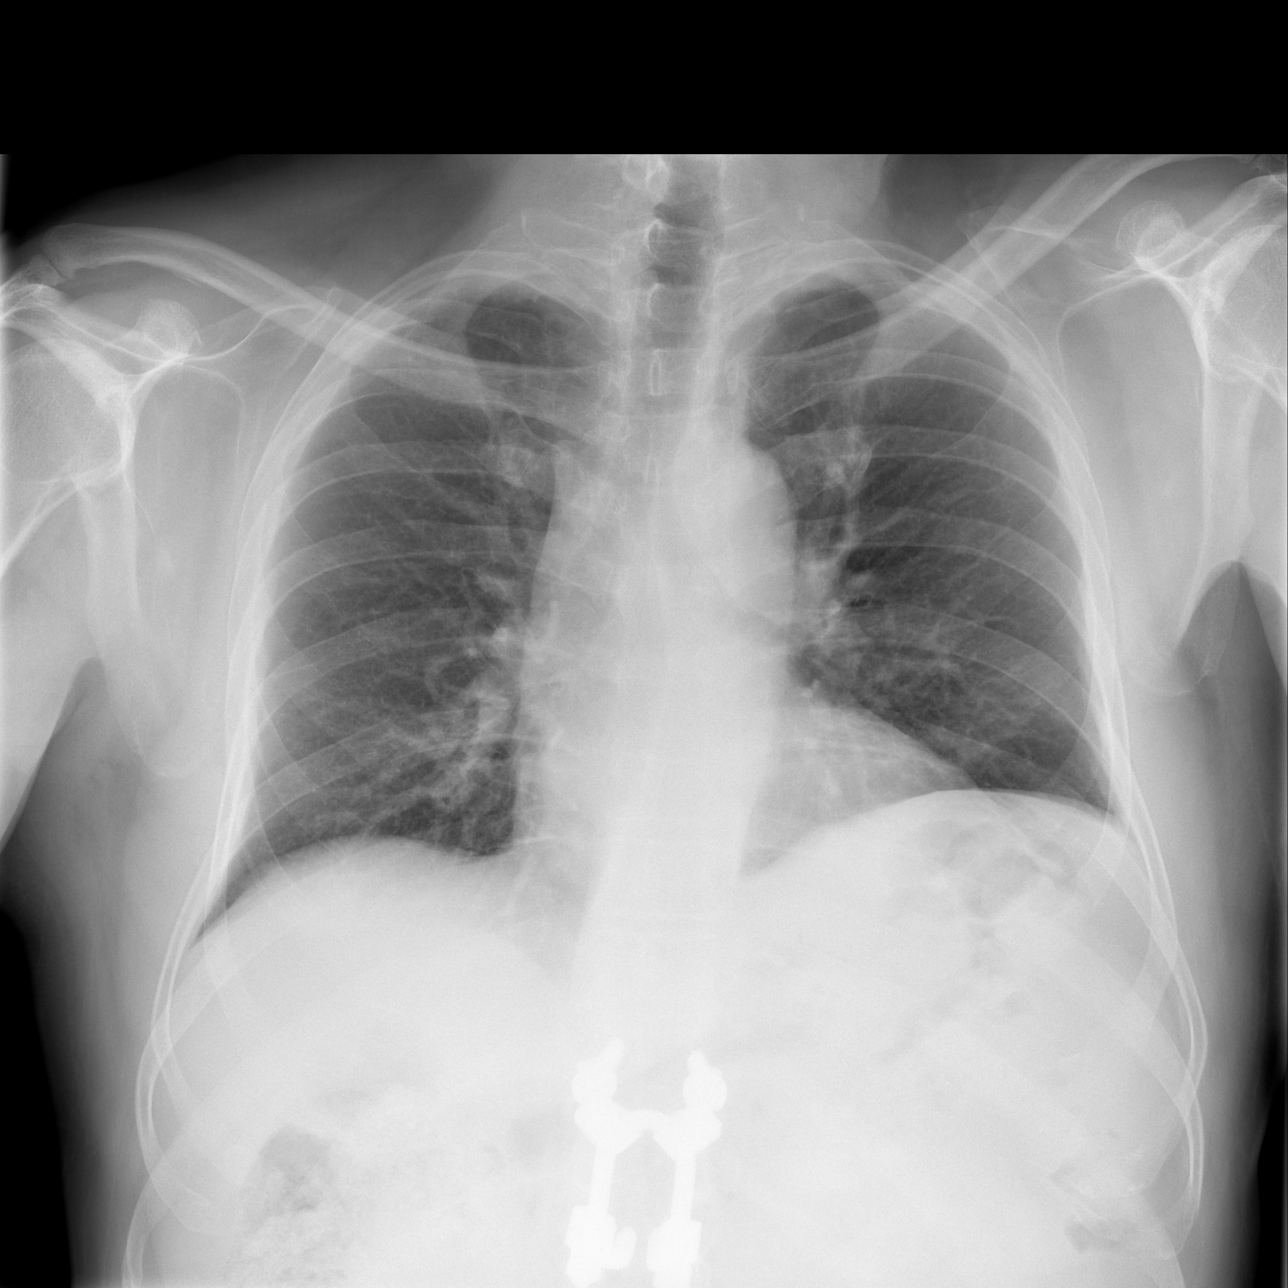

[w ribs ap/pa lower right *]
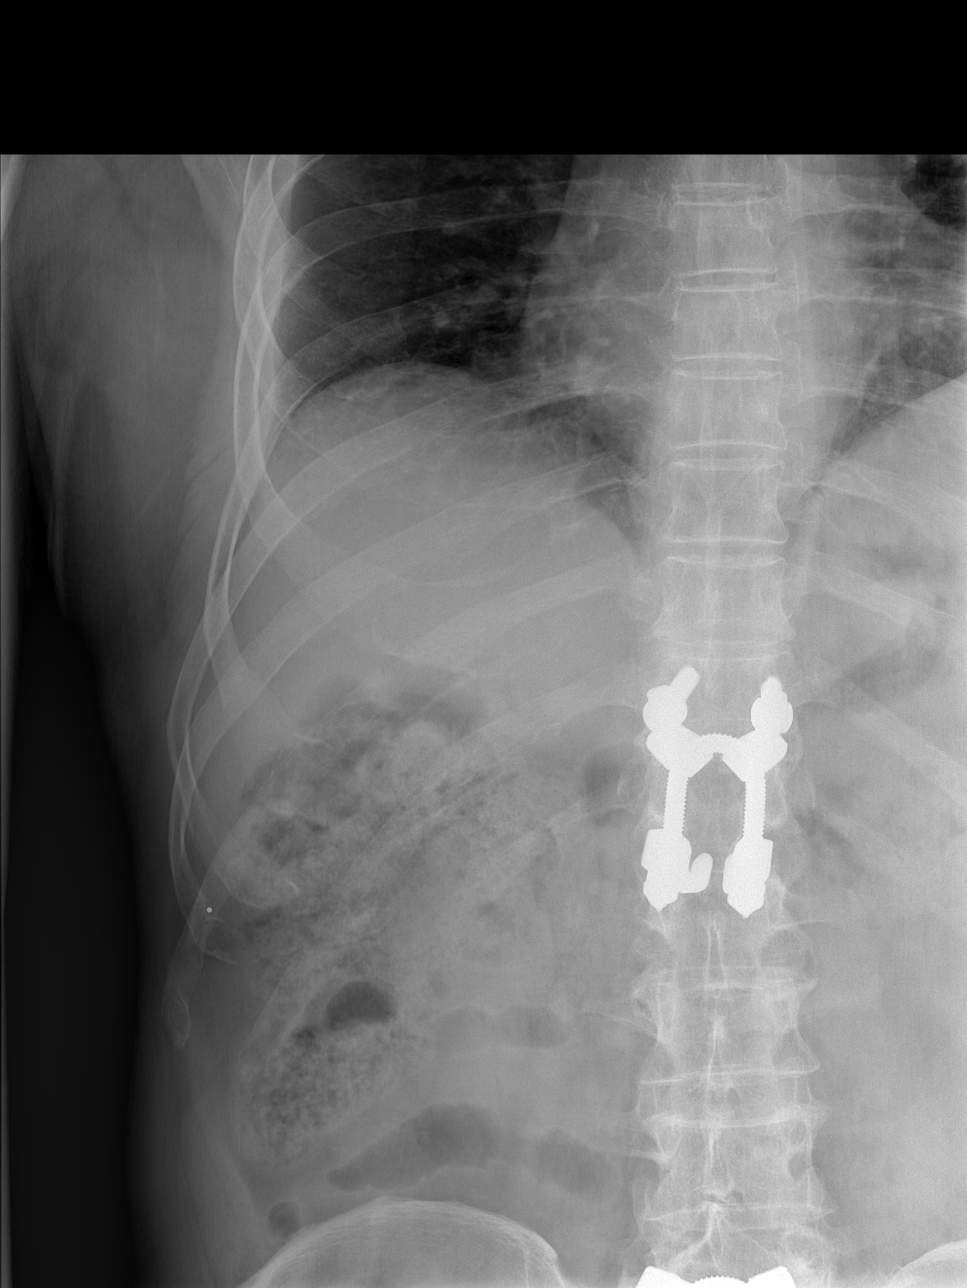

[w ribs oblique right *]
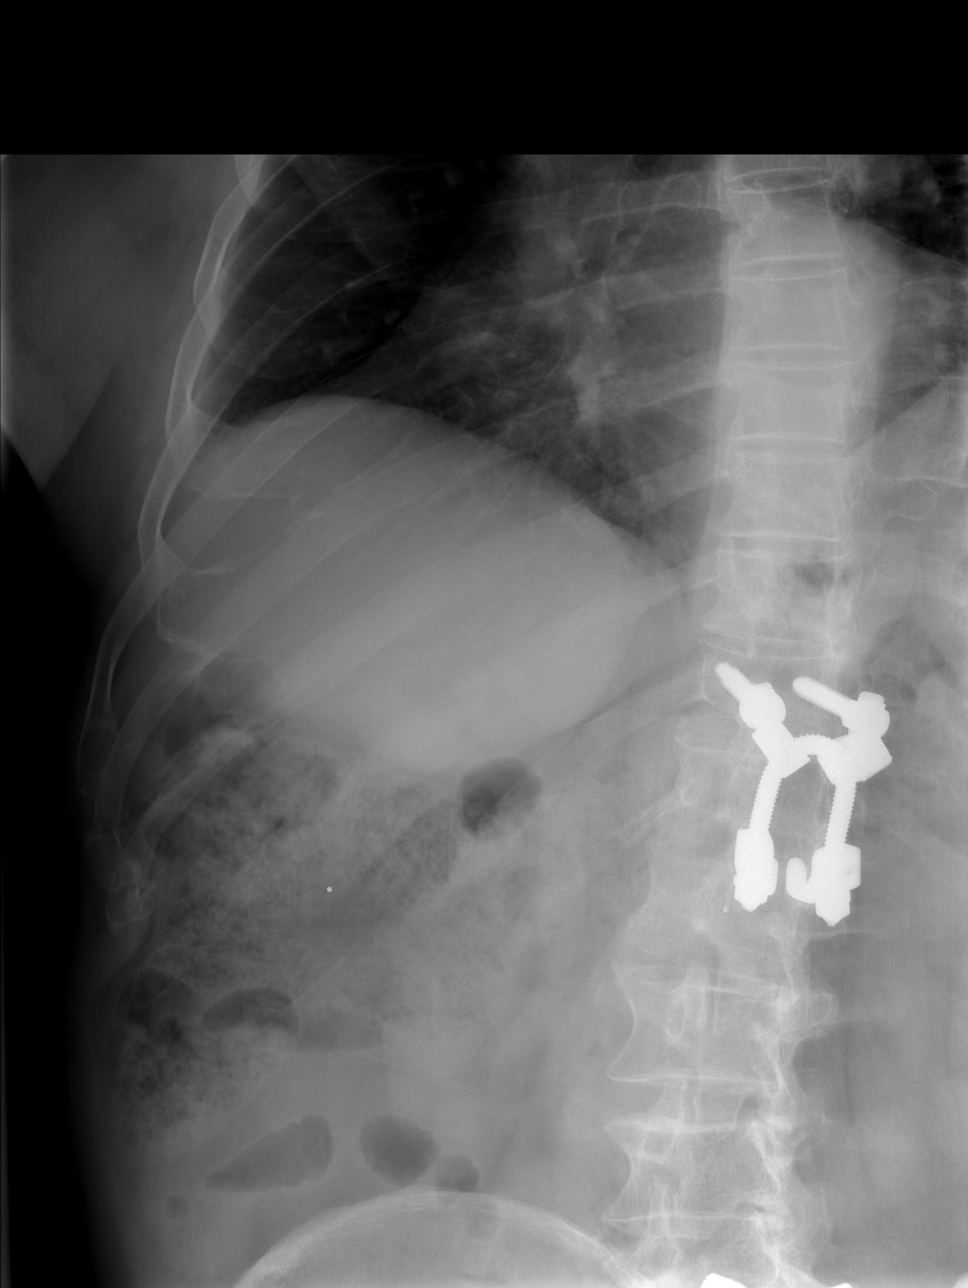

[3 of 3 positions shown; findings below may reference images not displayed]

FINDINGS: Postsurgical changes are noted in the lower thoracic spine stable
from previous exams. Cardiac shadow is within normal limits. The
lungs are well aerated without focal infiltrate or pneumothorax. No
acute rib fracture is seen.
IMPRESSION: No acute abnormality noted.

## 2018-05-20 IMAGING — CR DG THORACIC SPINE 2V
3 series · 3 of 3 positions shown · non-contrast
Comparison: 10/30/2015

CLINICAL DATA: Recent fall with upper back pain, initial encounter

EXAM:
THORACIC SPINE 2 VIEWS

[t t-spine a.p.]
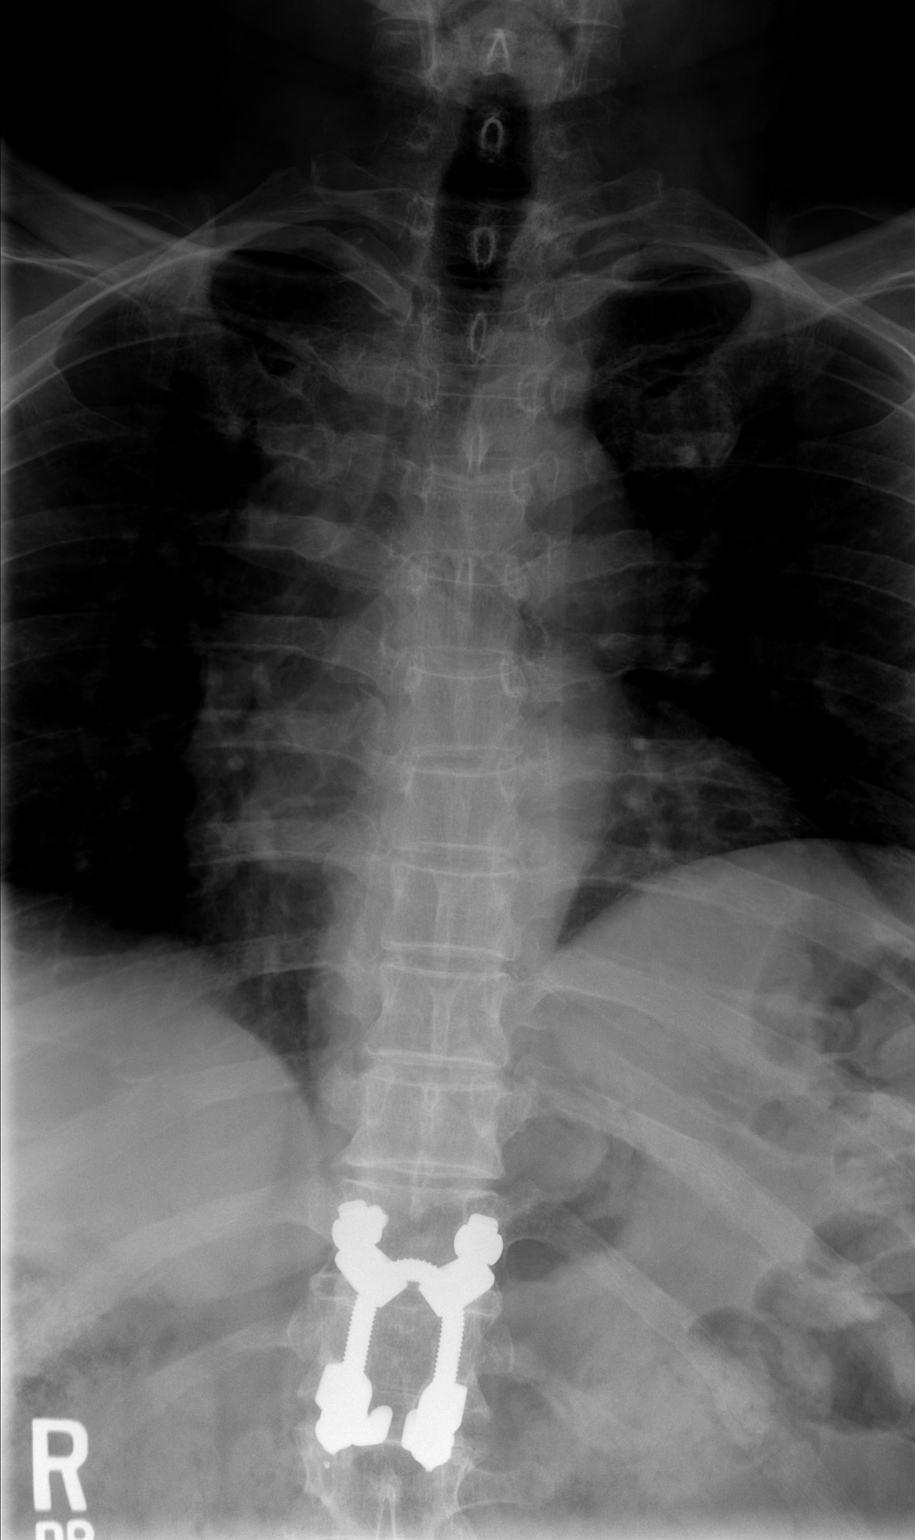

[t t-spine lat *]
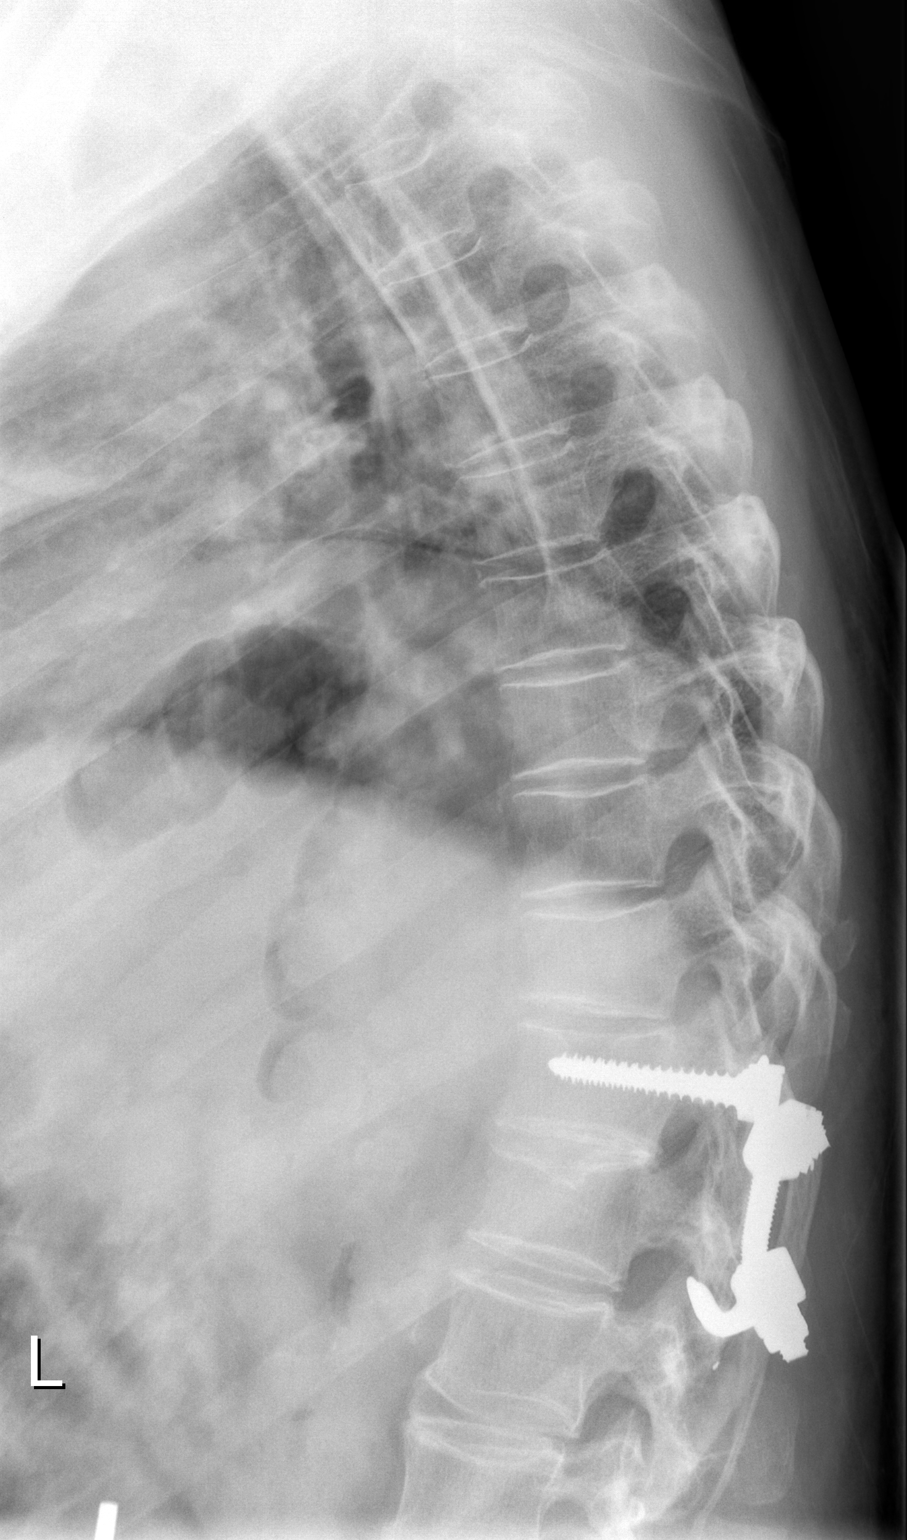

[t swimmers]
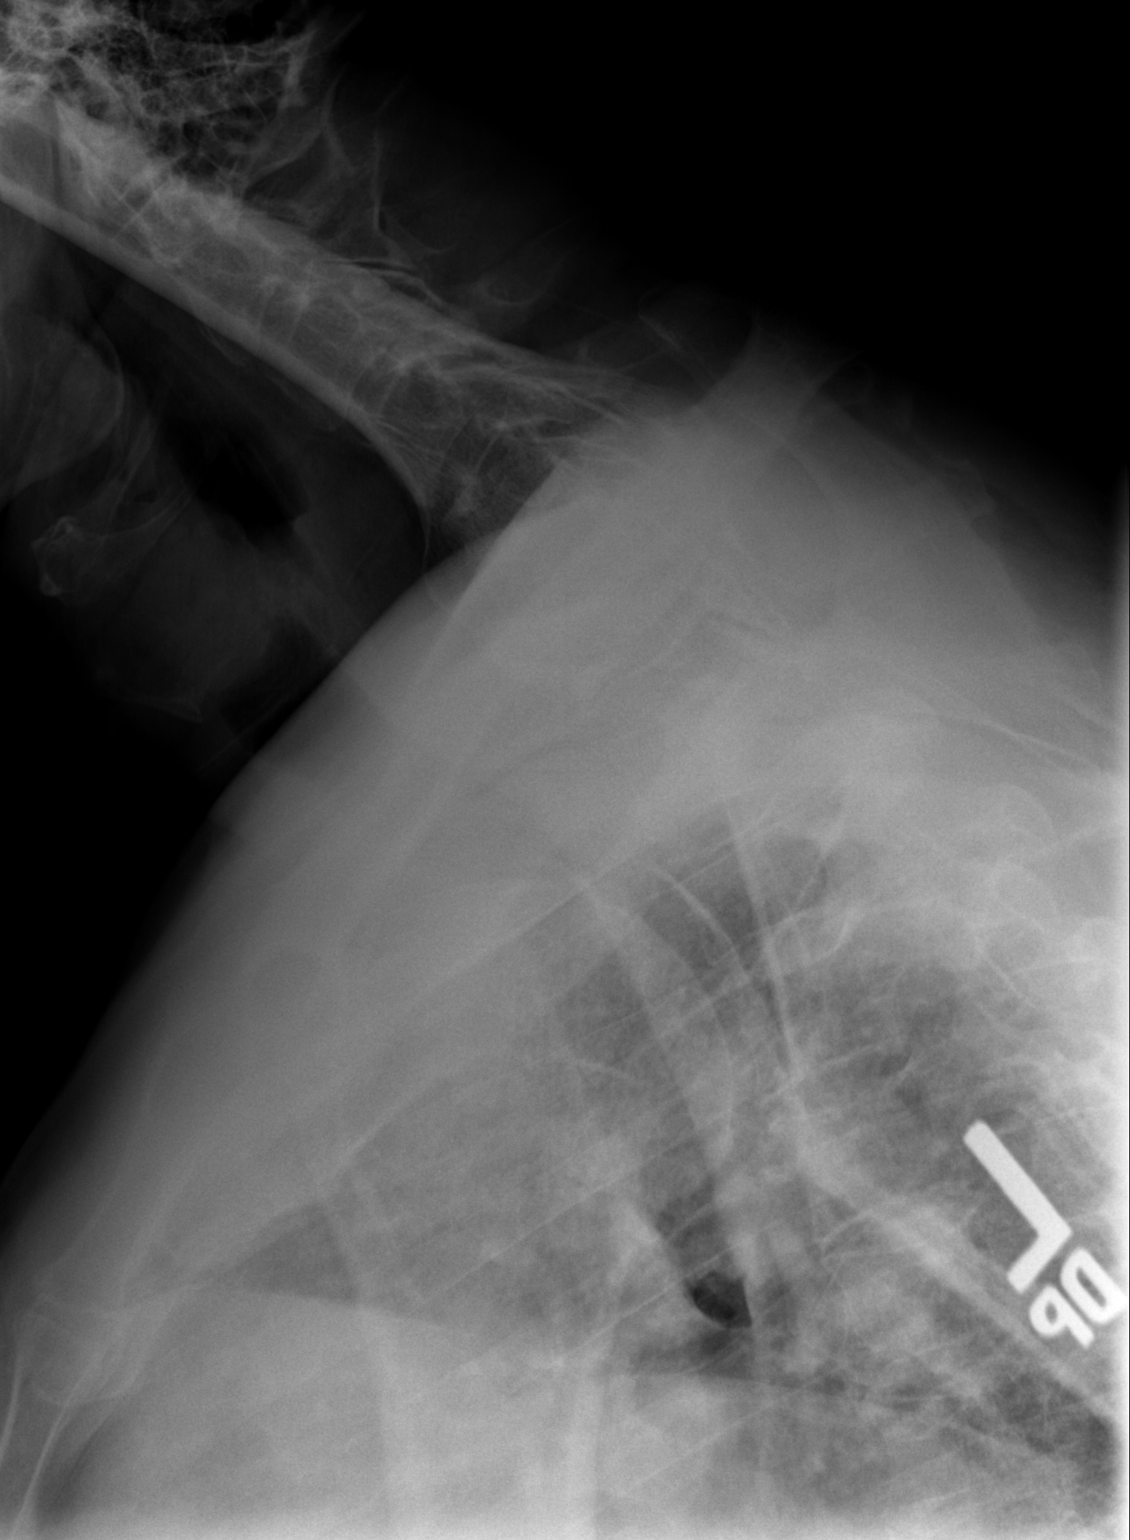

[3 of 3 positions shown; findings below may reference images not displayed]

FINDINGS: Stable postsurgical changes are noted in the lower thoracic spine.
Compression deformity at T12 is again seen and stable. No new
compression deformities are noted. No paraspinal mass lesion is
seen. The visualize ribcage is within normal limits.
IMPRESSION: Chronic changes without acute abnormality.

## 2018-05-21 DIAGNOSIS — E782 Mixed hyperlipidemia: Secondary | ICD-10-CM | POA: Diagnosis not present

## 2018-05-21 DIAGNOSIS — F325 Major depressive disorder, single episode, in full remission: Secondary | ICD-10-CM | POA: Diagnosis not present

## 2018-05-21 DIAGNOSIS — I10 Essential (primary) hypertension: Secondary | ICD-10-CM | POA: Diagnosis not present

## 2018-05-21 DIAGNOSIS — E1169 Type 2 diabetes mellitus with other specified complication: Secondary | ICD-10-CM | POA: Diagnosis not present

## 2018-05-21 DIAGNOSIS — Z1389 Encounter for screening for other disorder: Secondary | ICD-10-CM | POA: Diagnosis not present

## 2018-05-21 DIAGNOSIS — K219 Gastro-esophageal reflux disease without esophagitis: Secondary | ICD-10-CM | POA: Diagnosis not present

## 2018-05-21 DIAGNOSIS — Z0000 Encounter for general adult medical examination without abnormal findings: Secondary | ICD-10-CM | POA: Diagnosis not present

## 2018-05-21 DIAGNOSIS — Z125 Encounter for screening for malignant neoplasm of prostate: Secondary | ICD-10-CM | POA: Diagnosis not present

## 2018-05-21 DIAGNOSIS — N400 Enlarged prostate without lower urinary tract symptoms: Secondary | ICD-10-CM | POA: Diagnosis not present

## 2018-05-21 DIAGNOSIS — I2510 Atherosclerotic heart disease of native coronary artery without angina pectoris: Secondary | ICD-10-CM | POA: Diagnosis not present

## 2018-05-25 DIAGNOSIS — E1169 Type 2 diabetes mellitus with other specified complication: Secondary | ICD-10-CM | POA: Diagnosis not present

## 2018-05-25 DIAGNOSIS — Z7984 Long term (current) use of oral hypoglycemic drugs: Secondary | ICD-10-CM | POA: Diagnosis not present

## 2018-05-25 DIAGNOSIS — Z125 Encounter for screening for malignant neoplasm of prostate: Secondary | ICD-10-CM | POA: Diagnosis not present

## 2018-05-25 DIAGNOSIS — E782 Mixed hyperlipidemia: Secondary | ICD-10-CM | POA: Diagnosis not present

## 2018-06-27 ENCOUNTER — Other Ambulatory Visit: Payer: Self-pay

## 2018-06-27 ENCOUNTER — Encounter (HOSPITAL_COMMUNITY): Payer: Self-pay | Admitting: *Deleted

## 2018-06-27 ENCOUNTER — Emergency Department (HOSPITAL_COMMUNITY)
Admission: EM | Admit: 2018-06-27 | Discharge: 2018-06-27 | Disposition: A | Discharge status: Home / Self Care | Payer: Medicare Other | Attending: Emergency Medicine | Admitting: Emergency Medicine

## 2018-06-27 ENCOUNTER — Emergency Department (HOSPITAL_COMMUNITY): Payer: Medicare Other

## 2018-06-27 DIAGNOSIS — Z7984 Long term (current) use of oral hypoglycemic drugs: Secondary | ICD-10-CM | POA: Diagnosis not present

## 2018-06-27 DIAGNOSIS — I10 Essential (primary) hypertension: Secondary | ICD-10-CM | POA: Insufficient documentation

## 2018-06-27 DIAGNOSIS — Z20828 Contact with and (suspected) exposure to other viral communicable diseases: Secondary | ICD-10-CM | POA: Insufficient documentation

## 2018-06-27 DIAGNOSIS — Z79899 Other long term (current) drug therapy: Secondary | ICD-10-CM | POA: Insufficient documentation

## 2018-06-27 DIAGNOSIS — R509 Fever, unspecified: Secondary | ICD-10-CM | POA: Diagnosis not present

## 2018-06-27 DIAGNOSIS — Z03818 Encounter for observation for suspected exposure to other biological agents ruled out: Secondary | ICD-10-CM | POA: Diagnosis not present

## 2018-06-27 DIAGNOSIS — R1011 Right upper quadrant pain: Secondary | ICD-10-CM | POA: Diagnosis not present

## 2018-06-27 DIAGNOSIS — I2510 Atherosclerotic heart disease of native coronary artery without angina pectoris: Secondary | ICD-10-CM | POA: Diagnosis not present

## 2018-06-27 DIAGNOSIS — R109 Unspecified abdominal pain: Secondary | ICD-10-CM

## 2018-06-27 DIAGNOSIS — R05 Cough: Secondary | ICD-10-CM | POA: Diagnosis not present

## 2018-06-27 LAB — URINALYSIS, ROUTINE W REFLEX MICROSCOPIC
Bilirubin Urine: NEGATIVE
Glucose, UA: NEGATIVE mg/dL
Hgb urine dipstick: NEGATIVE
Ketones, ur: NEGATIVE mg/dL
Leukocytes,Ua: NEGATIVE
Nitrite: NEGATIVE
Protein, ur: NEGATIVE mg/dL
Specific Gravity, Urine: 1.011 (ref 1.005–1.030)
pH: 5 (ref 5.0–8.0)

## 2018-06-27 LAB — COMPREHENSIVE METABOLIC PANEL
ALT: 37 U/L (ref 0–44)
AST: 36 U/L (ref 15–41)
Albumin: 4.5 g/dL (ref 3.5–5.0)
Alkaline Phosphatase: 55 U/L (ref 38–126)
Anion gap: 9 (ref 5–15)
BUN: 11 mg/dL (ref 8–23)
CO2: 25 mmol/L (ref 22–32)
Calcium: 8.8 mg/dL — ABNORMAL LOW (ref 8.9–10.3)
Chloride: 100 mmol/L (ref 98–111)
Creatinine, Ser: 1.13 mg/dL (ref 0.61–1.24)
GFR calc Af Amer: >60 mL/min (ref >60)
GFR calc non Af Amer: >60 mL/min (ref >60)
Glucose, Bld: 122 mg/dL — ABNORMAL HIGH (ref 70–99)
Potassium: 4.1 mmol/L (ref 3.5–5.1)
Sodium: 134 mmol/L — ABNORMAL LOW (ref 135–145)
Total Bilirubin: 1.1 mg/dL (ref 0.3–1.2)
Total Protein: 7.4 g/dL (ref 6.5–8.1)

## 2018-06-27 LAB — CBC WITH DIFFERENTIAL/PLATELET
Abs Immature Granulocytes: 0.02 10*3/uL (ref 0.00–0.07)
Basophils Absolute: 0 10*3/uL (ref 0.0–0.1)
Basophils Relative: 0 %
Eosinophils Absolute: 0 10*3/uL (ref 0.0–0.5)
Eosinophils Relative: 0 %
HCT: 44.8 % (ref 39.0–52.0)
Hemoglobin: 15 g/dL (ref 13.0–17.0)
Immature Granulocytes: 1 %
Lymphocytes Relative: 12 %
Lymphs Abs: 0.5 10*3/uL — ABNORMAL LOW (ref 0.7–4.0)
MCH: 30.9 pg (ref 26.0–34.0)
MCHC: 33.5 g/dL (ref 30.0–36.0)
MCV: 92.2 fL (ref 80.0–100.0)
Monocytes Absolute: 1 10*3/uL (ref 0.1–1.0)
Monocytes Relative: 23 %
Neutro Abs: 2.6 10*3/uL (ref 1.7–7.7)
Neutrophils Relative %: 64 %
Platelets: 80 10*3/uL — ABNORMAL LOW (ref 150–400)
RBC: 4.86 MIL/uL (ref 4.22–5.81)
RDW: 13.6 % (ref 11.5–15.5)
WBC: 4.1 10*3/uL (ref 4.0–10.5)
nRBC: 0 % (ref 0.0–0.2)

## 2018-06-27 LAB — LACTIC ACID, PLASMA: Lactic Acid, Venous: 1.2 mmol/L (ref 0.5–1.9)

## 2018-06-27 NOTE — ED Provider Notes (Signed)
[facility] DEPT Provider Note   CSN: [id number] Arrival date & time: 06/27/18  1003    History   Chief Complaint Chief Complaint  Patient presents with  . Flank Pain    Rt    HPI [name] is a 68 y.o. male.     HPI Patient presents with right flank pain and back pain.  Started yesterday.  States he has had temperatures up to 101.  States he has had chills.  No dysuria.  States it feels a little bit like when he had a prostatitis with sepsis about 6 months ago.  Occasional cough.  No diarrhea or constipation.  Some mild upper abdominal pain mostly right posterior flank.  No rash.  No sick contacts. Past Medical History:  Diagnosis Date  . Anxiety   . CAD (coronary artery disease)   . Coronary atherosclerosis of native coronary artery 01/29/2013  . Hx of seasonal allergies   . Hyperlipidemia   . Hypertension     Patient Active Problem List   Diagnosis Date Noted  . Chest pain 10/30/2015  . Thrombocytopenia ([place]) 10/30/2015  . Bradycardia on ECG 10/30/2015  . CAD (coronary artery disease), native coronary artery 01/29/2013  . Pure hypercholesterolemia 01/29/2013  . COLONIC POLYPS, ADENOMATOUS 03/14/2007  . Essential hypertension 03/14/2007  . GERD (gastroesophageal reflux disease) 03/14/2007  . HIATAL HERNIA 03/14/2007  . NEPHROLITHIASIS 03/14/2007  . BACKACHE NOS 06/03/2006  . HEMORRHOIDS 11/18/2000    Past Surgical History:  Procedure Laterality Date  . ANGIOPLASTY  2010   stent  . LUMBAR LAMINECTOMY  1991   L3 and T12        Home Medications    Prior to Admission medications   Medication Sig Start Date End Date Taking? Authorizing Provider  acetaminophen (TYLENOL) 500 MG tablet Take 1,000 mg by mouth every 8 (eight) hours as needed for moderate pain.    Yes [provider]  aspirin EC 81 MG tablet Take 81 mg by mouth daily.   Yes [provider]  atorvastatin (LIPITOR) 40 MG tablet Take 40 mg by  mouth daily.    Yes [provider]  citalopram (CELEXA) 10 MG tablet Take 10 mg by mouth daily.   Yes [provider]  Coenzyme Q10 (CO Q 10) 100 MG CAPS Take 200 mg by mouth daily.   Yes [provider]  finasteride (PROSCAR) 5 MG tablet Take 5 mg by mouth daily.   Yes [provider]  gabapentin (NEURONTIN) 300 MG capsule Take 600 mg by mouth at bedtime. And may take an additional 300 mg once a day if needed   Yes [provider]  metFORMIN (GLUCOPHAGE-XR) 500 MG 24 hr tablet Take 500 mg by mouth daily. 02/13/18  Yes [provider]  Multiple Vitamins-Minerals (HAIR SKIN AND NAILS FORMULA PO) Take 1 tablet by mouth daily.   Yes [provider]  nitroGLYCERIN (NITROSTAT) 0.4 MG SL tablet Place 1 tablet (0.4 mg total) under the tongue every 5 (five) minutes x 3 doses as needed for chest pain. 10/31/15  Yes Florencia Reasons, MD  pantoprazole (PROTONIX) 40 MG tablet Take 1 tablet (40 mg total) by mouth daily. 12/30/17  Yes Ladene Artist, MD  tamsulosin (FLOMAX) 0.4 MG CAPS capsule Take 0.4 mg by mouth at bedtime.   Yes [provider]  traMADol (ULTRAM) 50 MG tablet Take 50 mg by mouth daily as needed (for pain).  10/17/15  Yes [provider]  valsartan (DIOVAN) 80 MG tablet TAKE 1 TABLET BY MOUTH DAILY 03/16/18  Yes Jerline Pain, MD  VITAMIN E PO Take 1 tablet by mouth daily.   Yes [provider]    Family History Family History  Problem Relation Age of Onset  . Breast cancer Mother   . Lung cancer Father   . Colon cancer Neg Hx   . Stomach cancer Neg Hx   . Esophageal cancer Neg Hx   . Pancreatic cancer Neg Hx     Social History Social History   Tobacco Use  . Smoking status: Never Smoker  . Smokeless tobacco: Never Used  Substance Use Topics  . Alcohol use: Yes    Comment: beer once or twice a month  . Drug use: No     Allergies   Chicken allergy; Morphine; Other; Pineapple; Sulfa  antibiotics; and Naproxen   Review of Systems Review of Systems  Constitutional: Positive for chills and fever.  HENT: Negative for congestion.   Respiratory: Positive for cough. Negative for chest tightness.   Cardiovascular: Negative for chest pain.  Gastrointestinal: Negative for abdominal pain.  Genitourinary: Positive for flank pain. Negative for dysuria.  Musculoskeletal: Positive for back pain.  Skin: Negative for rash.  Neurological: Negative for weakness.  Hematological: Negative for adenopathy.  Psychiatric/Behavioral: Negative for confusion.     Physical Exam Updated Vital Signs BP 137/62 (BP Location: Right Arm)   Pulse (!) 58   Temp 99.2 F (37.3 C) (Oral)   Resp 17   Ht 5\' 11"  (1.803 m)   Wt 80.7 kg   SpO2 98%   BMI 24.83 kg/m   Physical Exam Vitals signs and nursing note reviewed.  Eyes:     Pupils: Pupils are equal, round, and reactive to light.  Neck:     Musculoskeletal: Neck supple.  Cardiovascular:     Rate and Rhythm: Regular rhythm.  Pulmonary:     Breath sounds: No rhonchi.  Abdominal:     Comments: Mild right upper quadrant tenderness.  No rebound guarding or mass.  Genitourinary:    Comments: Right-sided CVA tenderness.  No perineal tenderness Musculoskeletal: Normal range of motion.  Skin:    General: Skin is warm.     Capillary Refill: Capillary refill takes less than 2 seconds.  Neurological:     Mental Status: He is alert. Mental status is at baseline.      ED Treatments / Results  Labs (all labs ordered are listed, but only abnormal results are displayed) Labs Reviewed  COMPREHENSIVE METABOLIC PANEL - Abnormal; Notable for the following components:      Result Value   Sodium 134 (*)    Glucose, Bld 122 (*)    Calcium 8.8 (*)    All other components within normal limits  CBC WITH DIFFERENTIAL/PLATELET - Abnormal; Notable for the following components:   Platelets 80 (*)    Lymphs Abs 0.5 (*)    All other components  within normal limits  CULTURE, BLOOD (ROUTINE X 2)  CULTURE, BLOOD (ROUTINE X 2)  NOVEL CORONAVIRUS, NAA (HOSPITAL ORDER, SEND-OUT TO REF LAB)  URINALYSIS, ROUTINE W REFLEX MICROSCOPIC  LACTIC ACID, PLASMA    EKG None  Radiology Dg Chest Portable 1 View  Result Date: 06/27/2018 CLINICAL DATA:  Fever, cough. EXAM: PORTABLE CHEST 1 VIEW COMPARISON:  Radiograph February 08, 2018. FINDINGS: The heart size and mediastinal contours are within normal limits. Both lungs are clear. The visualized skeletal structures are unremarkable. IMPRESSION: No  active disease. Electronically Signed   By: Marijo Conception M.D.   On: 06/27/2018 11:47    Procedures Procedures (including critical care time)  Medications Ordered in ED Medications - No data to display   Initial Impression / Assessment and Plan / ED Course  I have reviewed the triage vital signs and the nursing notes.  Pertinent labs & imaging results that were available during my care of the patient were reviewed by me and considered in my medical decision making (see chart for details).        Patient with right-sided flank pain.  Reportedly and fevers.  Has had previous sepsis.  Urine reassuring.  White count reassuring.  Hemoglobin reassuring.  X-ray reassuring.  Covid-test done but will come back later.  Well-appearing.  No rash.  Will discharge home.  Reviewed CT scans from 6 months ago and had no AAA at that time.  We will follow-up as needed.  Normal lactic acid.  No tenderness over prostate. Final Clinical Impressions(s) / ED Diagnoses   Final diagnoses:  Flank pain  Fever, unspecified fever cause    ED Discharge Orders    None       Davonna Belling, MD 06/27/18 1334

## 2018-06-27 NOTE — ED Notes (Signed)
Upon drawing the 2nd lactic acid, Dr. Alvino Chapel entered his room and told me not send it, that he wanted to d/c it. Pt. Remains awake, alert and in no distress.

## 2018-06-27 NOTE — ED Triage Notes (Signed)
Pt started having rt flank and back pain with weakness, wife gave him 3 Keflex that they had left. Spoke with Dr Roni Bread this morning who wanted him to come to the ED, Urinated a couple of hours ago without problems. Pt had sepsis in December 2019 related to Prostate.

## 2018-06-28 LAB — NOVEL CORONAVIRUS, NAA (HOSP ORDER, SEND-OUT TO REF LAB; TAT 18-24 HRS): SARS-CoV-2, NAA: NOT DETECTED

## 2018-07-02 LAB — CULTURE, BLOOD (ROUTINE X 2)
Culture: NO GROWTH
Culture: NO GROWTH
Special Requests: ADEQUATE
Special Requests: ADEQUATE

## 2018-10-01 ENCOUNTER — Telehealth: Payer: Self-pay | Admitting: Cardiology

## 2018-10-01 NOTE — Telephone Encounter (Signed)
New Message   Patient states for the past week he has been experiencing pain in his left arm. He states it radiates from his left shoulder to his hand. He mentions in the afternoon he has some hand numbness.Marland Kitchen He denies any shortness of breath. He did state he has been a bit tired though. Please call to discuss.

## 2018-10-01 NOTE — Telephone Encounter (Signed)
Pt called to report that over the past few months he has been having a dull and sometime sharp pain in his chest when he is at rest in the evening sitting down to relax. He also has the discomfort when he does some exertional activities. He says it usually only lasts a few seconds and he denies sob, dizziness with it. The pt also reports having tingling in his left shoulder down to his fingers but not related to the pain. This has been worsening over the past week, He has not been doing anything strenuous that could have caused him any back or neck problems.   Pt has nitro at home but has not had to use it.. I went over the instructions of use with him and he verbalized understanding.   He had COVID in 06/2018 but since has been doing well except he has been very tired and some weakness that is not typical for him but not affecting his normal activities.   I will forward to Dr. Duke Salvia for review.. unable to schedule pt prior to next week. Pt advised that if his symptoms worsen to try using his SL nitro and if no relief to have someone take him to the ER. Pt agreed.

## 2018-10-01 NOTE — Telephone Encounter (Signed)
After talking with Dr. Marlou Porch.. pt to see Dr. Tamala Julian 10/02/18 the DOD and will call if anything changes prior to his visit. He will wear a mask.       COVID-19 Pre-Screening Questions:  . In the past 7 to 10 days have you had a cough,  shortness of breath, headache, congestion, fever (100 or greater) body aches, chills, sore throat, or sudden loss of taste or sense of smell? NO . Have you been around anyone with known Covid 19. NO . Have you been around anyone who is awaiting Covid 19 test results in the past 7 to 10 days? NO . Have you been around anyone who has been exposed to Covid 19, or has mentioned symptoms of Covid 19 within the past 7 to 10 days? NO  If you have any concerns/questions about symptoms patients report during screening (either on the phone or at threshold). Contact the provider seeing the patient or DOD for further guidance.  If neither are available contact a member of the leadership team.

## 2018-10-02 ENCOUNTER — Ambulatory Visit (INDEPENDENT_AMBULATORY_CARE_PROVIDER_SITE_OTHER): Payer: Medicare Other | Admitting: Interventional Cardiology

## 2018-10-02 ENCOUNTER — Encounter: Payer: Self-pay | Admitting: Interventional Cardiology

## 2018-10-02 ENCOUNTER — Encounter: Payer: Self-pay | Admitting: *Deleted

## 2018-10-02 ENCOUNTER — Other Ambulatory Visit: Payer: Self-pay

## 2018-10-02 VITALS — BP 128/64 | HR 47 | Ht 71.0 in | Wt 179.0 lb

## 2018-10-02 DIAGNOSIS — I2510 Atherosclerotic heart disease of native coronary artery without angina pectoris: Secondary | ICD-10-CM

## 2018-10-02 DIAGNOSIS — Z7189 Other specified counseling: Secondary | ICD-10-CM | POA: Diagnosis not present

## 2018-10-02 DIAGNOSIS — M79602 Pain in left arm: Secondary | ICD-10-CM

## 2018-10-02 DIAGNOSIS — I10 Essential (primary) hypertension: Secondary | ICD-10-CM

## 2018-10-02 DIAGNOSIS — R001 Bradycardia, unspecified: Secondary | ICD-10-CM

## 2018-10-02 DIAGNOSIS — Z9861 Coronary angioplasty status: Secondary | ICD-10-CM

## 2018-10-02 DIAGNOSIS — E785 Hyperlipidemia, unspecified: Secondary | ICD-10-CM

## 2018-10-02 DIAGNOSIS — R079 Chest pain, unspecified: Secondary | ICD-10-CM | POA: Diagnosis not present

## 2018-10-02 NOTE — Progress Notes (Signed)
Cardiology Office Note:    Date:  10/02/2018   ID:  [name], DOB 01-22-1951, MRN [id number]  PCP:  Austin Orn, MD  Cardiologist:  No primary care provider on file.   Referring MD: Austin Orn, MD   Chief Complaint  Patient presents with  . Coronary Artery Disease  . Arm Pain    History of Present Illness:    [name] is a 68 y.o. male with a hx of history of PCI of the LAD 2010, hyperlipidemia, essential hypertension, anxiety disorder, and lumbar disc disease.  The patient is new to me.  His primary cardiologist is Dr. Marlou Johns.  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  Patient believes he had a recent cold infection.  The patient has been experiencing left arm pain and tingling.  A little bit of discomfort is present in the left lower neck.  A lot of times after work when he lies down to sleep the discomfort and arm intensifies and will get better if he sits up.  The discomfort when present can last up to an hour before going away.  With his coronary disease he had exertional dyspnea and pallor.  Perhaps he has had some dyspnea and pallor according to his wife.  The dyspnea and pallor are not associated with arm and neck discomfort.  Past Medical History:  Diagnosis Date  . Anxiety   . CAD (coronary artery disease)   . Coronary atherosclerosis of native coronary artery 01/29/2013  . Hx of seasonal allergies   . Hyperlipidemia   . Hypertension     Past Surgical History:  Procedure Laterality Date  . ANGIOPLASTY  2010   stent  . LUMBAR LAMINECTOMY  1991   L3 and T12    Current Medications: Current Meds  Medication Sig  . acetaminophen (TYLENOL) 500 MG tablet Take 1,000 mg by mouth every 8 (eight) hours as needed for moderate pain.   Marland Kitchen aspirin EC 81 MG tablet Take 81 mg by mouth daily.  Marland Kitchen atorvastatin (LIPITOR) 40 MG tablet Take 40 mg by mouth daily.   . citalopram (CELEXA) 10 MG tablet Take 10 mg by mouth daily.  . Coenzyme Q10 (CO Q 10)  100 MG CAPS Take 200 mg by mouth daily.  . finasteride (PROSCAR) 5 MG tablet Take 5 mg by mouth daily.  Marland Kitchen gabapentin (NEURONTIN) 300 MG capsule Take 600 mg by mouth at bedtime. And may take an additional 300 mg once a day if needed  . metFORMIN (GLUCOPHAGE-XR) 500 MG 24 hr tablet Take 500 mg by mouth daily.  . Multiple Vitamins-Minerals (HAIR SKIN AND NAILS FORMULA PO) Take 1 tablet by mouth daily.  . nitroGLYCERIN (NITROSTAT) 0.4 MG SL tablet Place 1 tablet (0.4 mg total) under the tongue every 5 (five) minutes x 3 doses as needed for chest pain.  . pantoprazole (PROTONIX) 40 MG tablet Take 1 tablet (40 mg total) by mouth daily.  . tamsulosin (FLOMAX) 0.4 MG CAPS capsule Take 0.4 mg by mouth at bedtime.  . traMADol (ULTRAM) 50 MG tablet Take 50 mg by mouth daily as needed (for pain).   . valsartan (DIOVAN) 80 MG tablet TAKE 1 TABLET BY MOUTH DAILY  . vitamin B-12 (CYANOCOBALAMIN) 1000 MCG tablet Take 1,000 mcg by mouth daily.  Marland Kitchen VITAMIN E PO Take 1 tablet by mouth daily.   Current Facility-Administered Medications for the 10/02/18 encounter (Office Visit) with Belva Crome, MD  Medication  . 0.9 %  sodium chloride infusion     Allergies:   Chicken allergy, Morphine, Other, Pineapple, Sulfa antibiotics, and Naproxen   Social History   Socioeconomic History  . Marital status: Married    Spouse name: Not on file  . Number of children: Not on file  . Years of education: Not on file  . Highest education level: Not on file  Occupational History  . Not on file  Social Needs  . Financial resource strain: Not on file  . Food insecurity    Worry: Not on file    Inability: Not on file  . Transportation needs    Medical: Not on file    Non-medical: Not on file  Tobacco Use  . Smoking status: Never Smoker  . Smokeless tobacco: Never Used  Substance and Sexual Activity  . Alcohol use: Yes    Comment: beer once or twice a month  . Drug use: No  . Sexual activity: Not on file   Lifestyle  . Physical activity    Days per week: Not on file    Minutes per session: Not on file  . Stress: Not on file  Relationships  . Social Herbalist on phone: Not on file    Gets together: Not on file    Attends religious service: Not on file    Active member of club or organization: Not on file    Attends meetings of clubs or organizations: Not on file    Relationship status: Not on file  Other Topics Concern  . Not on file  Social History Narrative  . Not on file     Family History: The patient's family history includes Breast cancer in his mother; Lung cancer in his father. There is no history of Colon cancer, Stomach cancer, Esophageal cancer, or Pancreatic cancer.  ROS:   Please see the history of present illness.    Allergies that he may have coronary occlusion.  Otherwise no specific complaints.  All other systems reviewed and are negative.  EKGs/Labs/Other Studies Reviewed:    The following studies were reviewed today: Nuclear stress test September 2017: Study Result    There was no ST segment deviation noted during stress.  No T wave inversion was noted during stress.  The study is normal.  Nuclear stress EF: 56%.  The left ventricular ejection fraction is normal (55-65%).   Normal stress nuclear study with no ischemia or infarction; EF 56 with normal wall motion.       EKG:  EKG sinus bradycardia, 47 bpm.  Otherwise normal in appearance compared to the prior tracing from September 2019, the heart rate is slower.  Recent Labs: 06/27/2018: ALT 37; BUN 11; Creatinine, Ser 1.13; Hemoglobin 15.0; Platelets 80; Potassium 4.1; Sodium 134  Recent Lipid Panel    Component Value Date/Time   CHOL 150 10/31/2015 1137   TRIG 201 (H) 10/31/2015 1137   HDL 40 (L) 10/31/2015 1137   CHOLHDL 3.8 10/31/2015 1137   VLDL 40 10/31/2015 1137   LDLCALC 70 10/31/2015 1137    Physical Exam:    VS:  BP 128/64   Pulse (!) 47   Ht 5\' 11"  (1.803 m)    Wt 179 lb (81.2 kg)   SpO2 97%   BMI 24.97 kg/m     Wt Readings from Last 3 Encounters:  10/02/18 179 lb (81.2 kg)  06/27/18 178 lb (80.7 kg)  12/30/17 191 lb 8 oz (86.9 kg)     GEN: .  No acute distress HEENT: Normal NECK: No JVD. LYMPHATICS: No lymphadenopathy CARDIAC:  RRR without murmur, gallop, or edema. VASCULAR:  Normal Pulses. No bruits. RESPIRATORY:  Clear to auscultation without rales, wheezing or rhonchi  ABDOMEN: Soft, non-tender, non-distended, No pulsatile mass, MUSCULOSKELETAL: No deformity  SKIN: Warm and dry NEUROLOGIC:  Alert and oriented x 3 PSYCHIATRIC:  Normal affect   ASSESSMENT:    1. CAD S/P percutaneous coronary angioplasty   2. Essential hypertension   3. Hyperlipidemia, unspecified hyperlipidemia type   4. Bradycardia   5. Left arm pain   6. Educated About Covid-19 Virus Infection   7. Chest pain, unspecified type    PLAN:    In order of problems listed above:  1. Patient needs a stress of a Lexiscan Myoview study to exclude myocardial ischemia.  He had a normal study 3 years ago.  Current symptoms demand out an ischemic work-up be done. 2. Blood pressure is excellent. 3. LDL when last tested was 50. 4. Bradycardia is present but is asymptomatic.  He may have sinus node dysfunction.  This needs to be followed over time. 5. Left arm pain and neck pain are likely radicular.  He needs to speak to the primary care physician about this. 6. Washing, waiting, and masking discussed to avoid/prevent COVID infection. 7. See dialogue under #1.   Medication Adjustments/Labs and Tests Ordered: Current medicines are reviewed at length with the patient today.  Concerns regarding medicines are outlined above.  Orders Placed This Encounter  Procedures  . MYOCARDIAL PERFUSION IMAGING  . EKG 12-Lead   No orders of the defined types were placed in this encounter.   Patient Instructions  Medication Instructions:  Your physician recommends that you  continue on your current medications as directed. Please refer to the Current Medication list given to you today.  If you need a refill on your cardiac medications before your next appointment, please call your pharmacy.   Lab work: None If you have labs (blood work) drawn today and your tests are completely normal, you will receive your results only by: Marland Kitchen MyChart Message (if you have MyChart) OR . A paper copy in the mail If you have any lab test that is abnormal or we need to change your treatment, we will call you to review the results.  Testing/Procedures: Your physician has requested that you have a lexiscan myoview. For further information please visit [email or web address]. Please follow instruction sheet, as given.   Follow-Up: Keep current follow up with Dr. Marlou Johns in October.  Any Other Special Instructions Will Be Listed Below (If Applicable).       Signed, Austin Grooms, MD  10/02/2018 11:15 AM    [place]

## 2018-10-02 NOTE — Patient Instructions (Signed)
Medication Instructions:  Your physician recommends that you continue on your current medications as directed. Please refer to the Current Medication list given to you today.  If you need a refill on your cardiac medications before your next appointment, please call your pharmacy.   Lab work: None If you have labs (blood work) drawn today and your tests are completely normal, you will receive your results only by: Marland Kitchen MyChart Message (if you have MyChart) OR . A paper copy in the mail If you have any lab test that is abnormal or we need to change your treatment, we will call you to review the results.  Testing/Procedures: Your physician has requested that you have a lexiscan myoview. For further information please visit [email or web address]. Please follow instruction sheet, as given.   Follow-Up: Keep current follow up with Dr. Marlou Porch in October.  Any Other Special Instructions Will Be Listed Below (If Applicable).

## 2018-10-06 ENCOUNTER — Telehealth (HOSPITAL_COMMUNITY): Payer: Self-pay | Admitting: *Deleted

## 2018-10-06 ENCOUNTER — Encounter (HOSPITAL_COMMUNITY): Payer: Self-pay | Admitting: *Deleted

## 2018-10-06 NOTE — Telephone Encounter (Signed)
Left message on voicemail per DPR in reference to upcoming appointment scheduled on 10/08/18 at 1045 with detailed instructions given per Myocardial Perfusion Study Information Sheet for the test. LM to arrive 15 minutes early, and that it is imperative to arrive on time for appointment to keep from having the test rescheduled. If you need to cancel or reschedule your appointment, please call the office within 24 hours of your appointment. Failure to do so may result in a cancellation of your appointment, and a $50 no show fee. Phone number given for call back for any questions. Jawaan Adachi, Lucius Conn letter sent

## 2018-10-08 ENCOUNTER — Ambulatory Visit (HOSPITAL_COMMUNITY): Payer: Medicare Other | Attending: Cardiology

## 2018-10-08 ENCOUNTER — Other Ambulatory Visit: Payer: Self-pay

## 2018-10-08 ENCOUNTER — Encounter (HOSPITAL_COMMUNITY): Payer: Self-pay

## 2018-10-08 DIAGNOSIS — R079 Chest pain, unspecified: Secondary | ICD-10-CM

## 2018-10-08 LAB — MYOCARDIAL PERFUSION IMAGING
LV dias vol: 97 mL (ref 62–150)
LV sys vol: 34 mL
Peak HR: 69 {beats}/min
Rest HR: 42 {beats}/min
SDS: 0
SRS: 0
SSS: 0
TID: 1.02

## 2018-10-08 MED ORDER — TECHNETIUM TC 99M TETROFOSMIN IV KIT
31.7000 | PACK | Freq: Once | INTRAVENOUS | Status: AC | PRN
  Administered 2018-10-08: 31.7 via INTRAVENOUS
  Filled 2018-10-08: qty 32

## 2018-10-08 MED ORDER — TECHNETIUM TC 99M TETROFOSMIN IV KIT
10.4000 | PACK | Freq: Once | INTRAVENOUS | Status: AC | PRN
  Administered 2018-10-08: 10.4 via INTRAVENOUS
  Filled 2018-10-08: qty 11

## 2018-10-08 MED ORDER — REGADENOSON 0.4 MG/5ML IV SOLN
0.4000 mg | Freq: Once | INTRAVENOUS | Status: AC
  Administered 2018-10-08: 0.4 mg via INTRAVENOUS

## 2018-10-16 DIAGNOSIS — E782 Mixed hyperlipidemia: Secondary | ICD-10-CM | POA: Diagnosis not present

## 2018-10-16 DIAGNOSIS — I2510 Atherosclerotic heart disease of native coronary artery without angina pectoris: Secondary | ICD-10-CM | POA: Diagnosis not present

## 2018-10-16 DIAGNOSIS — F325 Major depressive disorder, single episode, in full remission: Secondary | ICD-10-CM | POA: Diagnosis not present

## 2018-10-16 DIAGNOSIS — I10 Essential (primary) hypertension: Secondary | ICD-10-CM | POA: Diagnosis not present

## 2018-10-16 DIAGNOSIS — Z7984 Long term (current) use of oral hypoglycemic drugs: Secondary | ICD-10-CM | POA: Diagnosis not present

## 2018-10-16 DIAGNOSIS — N400 Enlarged prostate without lower urinary tract symptoms: Secondary | ICD-10-CM | POA: Diagnosis not present

## 2018-10-16 DIAGNOSIS — E1169 Type 2 diabetes mellitus with other specified complication: Secondary | ICD-10-CM | POA: Diagnosis not present

## 2018-11-19 DIAGNOSIS — Z23 Encounter for immunization: Secondary | ICD-10-CM | POA: Diagnosis not present

## 2018-11-19 DIAGNOSIS — N400 Enlarged prostate without lower urinary tract symptoms: Secondary | ICD-10-CM | POA: Diagnosis not present

## 2018-11-19 DIAGNOSIS — E1169 Type 2 diabetes mellitus with other specified complication: Secondary | ICD-10-CM | POA: Diagnosis not present

## 2018-11-19 DIAGNOSIS — R1012 Left upper quadrant pain: Secondary | ICD-10-CM | POA: Diagnosis not present

## 2018-11-19 DIAGNOSIS — I10 Essential (primary) hypertension: Secondary | ICD-10-CM | POA: Diagnosis not present

## 2018-11-19 DIAGNOSIS — R310 Gross hematuria: Secondary | ICD-10-CM | POA: Diagnosis not present

## 2018-11-30 ENCOUNTER — Ambulatory Visit: Payer: Medicare Other | Admitting: Cardiology

## 2018-12-23 DIAGNOSIS — R05 Cough: Secondary | ICD-10-CM | POA: Diagnosis not present

## 2018-12-23 DIAGNOSIS — U071 COVID-19: Secondary | ICD-10-CM | POA: Diagnosis not present

## 2018-12-30 DIAGNOSIS — N410 Acute prostatitis: Secondary | ICD-10-CM | POA: Diagnosis not present

## 2018-12-30 DIAGNOSIS — N481 Balanitis: Secondary | ICD-10-CM | POA: Diagnosis not present

## 2019-04-12 ENCOUNTER — Other Ambulatory Visit: Payer: Self-pay | Admitting: Gastroenterology

## 2019-04-12 DIAGNOSIS — K2210 Ulcer of esophagus without bleeding: Secondary | ICD-10-CM

## 2019-04-21 ENCOUNTER — Other Ambulatory Visit: Payer: Self-pay | Admitting: Urology

## 2019-04-26 ENCOUNTER — Other Ambulatory Visit: Payer: Self-pay | Admitting: Gastroenterology

## 2019-04-26 DIAGNOSIS — K2210 Ulcer of esophagus without bleeding: Secondary | ICD-10-CM

## 2019-06-23 ENCOUNTER — Ambulatory Visit: Payer: Medicare Other | Admitting: Cardiology

## 2019-06-23 ENCOUNTER — Other Ambulatory Visit: Payer: Self-pay

## 2019-06-23 ENCOUNTER — Encounter: Payer: Self-pay | Admitting: Cardiology

## 2019-06-23 VITALS — BP 158/90 | HR 52 | Ht 71.0 in | Wt 182.0 lb

## 2019-06-23 DIAGNOSIS — I10 Essential (primary) hypertension: Secondary | ICD-10-CM

## 2019-06-23 DIAGNOSIS — I2510 Atherosclerotic heart disease of native coronary artery without angina pectoris: Secondary | ICD-10-CM

## 2019-06-23 DIAGNOSIS — R001 Bradycardia, unspecified: Secondary | ICD-10-CM

## 2019-06-23 DIAGNOSIS — Z9861 Coronary angioplasty status: Secondary | ICD-10-CM

## 2019-06-23 DIAGNOSIS — E785 Hyperlipidemia, unspecified: Secondary | ICD-10-CM | POA: Diagnosis not present

## 2019-06-23 NOTE — Patient Instructions (Signed)
Medication Instructions:  Your physician recommends that you continue on your current medications as directed. Please refer to the Current Medication list given to you today.  *If you need a refill on your cardiac medications before your next appointment, please call your pharmacy*  Follow-Up: At [facility], you and your health needs are our priority.  As part of our continuing mission to provide you with exceptional heart care, we have created designated Provider Care Teams.  These Care Teams include your primary Cardiologist (physician) and Advanced Practice Providers (APPs -  Physician Assistants and Nurse Practitioners) who all work together to provide you with the care you need, when you need it.  Your next appointment:   1 year(s)  The format for your next appointment:   In Person  Provider:   You may see Candee Furbish, MD or one of the following Advanced Practice Providers on your designated Care Team:    Truitt Merle, NP  Cecilie Kicks, NP  Kathyrn Drown, NP

## 2019-06-23 NOTE — Progress Notes (Signed)
Cardiology Office Note:    Date:  06/23/2019   ID:  [name], DOB June 08, 1950, MRN [id number]  PCP:  Lavone Orn, MD  Cardiologist:  Candee Furbish, MD  Electrophysiologist:  None   Referring MD: Lavone Orn, MD     History of Present Illness:    [name] is a 69 y.o. male with coronary artery disease status post PCI to LAD in 2010 with hypertension anxiety lumbar disc disease.  Has had some trouble controlling his blood pressure at times.  Has had some anxiety surrounding this.  Last seen in August 2020 by Dr. Santo Held stress test was ordered and was normal.  Overall been doing very well except for some higher blood pressures. Dr. Laurann Montana has been working on this. New addition of chlorthalidone. He still enjoys restoring old Corvette's. 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  Past Medical History:  Diagnosis Date  . Anxiety   . CAD (coronary artery disease)   . Coronary atherosclerosis of native coronary artery 01/29/2013  . Hx of seasonal allergies   . Hyperlipidemia   . Hypertension     Past Surgical History:  Procedure Laterality Date  . ANGIOPLASTY  2010   stent  . LUMBAR LAMINECTOMY  1991   L3 and T12    Current Medications: Current Meds  Medication Sig  . acetaminophen (TYLENOL) 500 MG tablet Take 1,000 mg by mouth every 8 (eight) hours as needed for moderate pain.   Marland Kitchen aspirin EC 81 MG tablet Take 81 mg by mouth daily.  Marland Kitchen atorvastatin (LIPITOR) 40 MG tablet Take 40 mg by mouth daily.   . chlorthalidone (HYGROTON) 25 MG tablet Take 25 mg by mouth daily.  . citalopram (CELEXA) 10 MG tablet Take 10 mg by mouth daily.  . Coenzyme Q10 (CO Q 10) 100 MG CAPS Take 200 mg by mouth daily.  . finasteride (PROSCAR) 5 MG tablet Take 5 mg by mouth daily.  Marland Kitchen gabapentin (NEURONTIN) 300 MG capsule Take 600 mg by mouth at bedtime. And may take an additional 300 mg once a day if needed  . metFORMIN (GLUCOPHAGE-XR) 500 MG 24 hr tablet Take 500 mg  by mouth daily.  . Multiple Vitamins-Minerals (HAIR SKIN AND NAILS FORMULA PO) Take 1 tablet by mouth daily.  . nitroGLYCERIN (NITROSTAT) 0.4 MG SL tablet Place 1 tablet (0.4 mg total) under the tongue every 5 (five) minutes x 3 doses as needed for chest pain.  . pantoprazole (PROTONIX) 40 MG tablet Take 1 tablet by mouth once daily  . tamsulosin (FLOMAX) 0.4 MG CAPS capsule Take 1 capsule by mouth at bedtime  . traMADol (ULTRAM) 50 MG tablet Take 50 mg by mouth daily as needed (for pain).   . valsartan (DIOVAN) 80 MG tablet TAKE 1 TABLET BY MOUTH DAILY  . vitamin B-12 (CYANOCOBALAMIN) 1000 MCG tablet Take 1,000 mcg by mouth daily.  Marland Kitchen VITAMIN E PO Take 1 tablet by mouth daily.   Current Facility-Administered Medications for the 06/23/19 encounter (Office Visit) with Jerline Pain, MD  Medication  . 0.9 %  sodium chloride infusion     Allergies:   Chicken allergy, Morphine, Other, Pineapple, Sulfa antibiotics, and Naproxen   Social History   Socioeconomic History  . Marital status: Married    Spouse name: Not on file  . Number of children: Not on file  . Years of education: Not on file  . Highest education level: Not on file  Occupational History  . Not on  file  Tobacco Use  . Smoking status: Never Smoker  . Smokeless tobacco: Never Used  Substance and Sexual Activity  . Alcohol use: Yes    Comment: beer once or twice a month  . Drug use: No  . Sexual activity: Not on file  Other Topics Concern  . Not on file  Social History Narrative  . Not on file   Social Determinants of Health   Financial Resource Strain:   . Difficulty of Paying Living Expenses:   Food Insecurity:   . Worried About Charity fundraiser in the Last Year:   . Arboriculturist in the Last Year:   Transportation Needs:   . Film/video editor (Medical):   Marland Kitchen Lack of Transportation (Non-Medical):   Physical Activity:   . Days of Exercise per Week:   . Minutes of Exercise per Session:   Stress:   .  Feeling of Stress :   Social Connections:   . Frequency of Communication with Friends and Family:   . Frequency of Social Gatherings with Friends and Family:   . Attends Religious Services:   . Active Member of Clubs or Organizations:   . Attends Archivist Meetings:   Marland Kitchen Marital Status:      Family History: The patient's family history includes Breast cancer in his mother; Lung cancer in his father. There is no history of Colon cancer, Stomach cancer, Esophageal cancer, or Pancreatic cancer.  ROS:   Please see the history of present illness.     All other systems reviewed and are negative.  EKGs/Labs/Other Studies Reviewed:    The following studies were reviewed today:  NUC stress 2020  Nuclear stress EF: 64%.  There was no ST segment deviation noted during stress.  The study is normal.  This is a low risk study.  The left ventricular ejection fraction is normal (55-65%).  No change compared to prior study.  EKG:  EKG is not ordered today.  Recent Labs: 06/27/2018: ALT 37; BUN 11; Creatinine, Ser 1.13; Hemoglobin 15.0; Platelets 80; Potassium 4.1; Sodium 134  Recent Lipid Panel    Component Value Date/Time   CHOL 150 10/31/2015 1137   TRIG 201 (H) 10/31/2015 1137   HDL 40 (L) 10/31/2015 1137   CHOLHDL 3.8 10/31/2015 1137   VLDL 40 10/31/2015 1137   LDLCALC 70 10/31/2015 1137    Physical Exam:    VS:  BP (!) 158/90   Pulse (!) 52   Ht 5\' 11"  (1.803 m)   Wt 182 lb (82.6 kg)   SpO2 96%   BMI 25.38 kg/m     Wt Readings from Last 3 Encounters:  06/23/19 182 lb (82.6 kg)  10/08/18 179 lb (81.2 kg)  10/02/18 179 lb (81.2 kg)     GEN:  Well nourished, well developed in no acute distress HEENT: Normal NECK: No JVD; No carotid bruits LYMPHATICS: No lymphadenopathy CARDIAC: RRR, no murmurs, rubs, gallops RESPIRATORY:  Clear to auscultation without rales, wheezing or rhonchi  ABDOMEN: Soft, non-tender, non-distended MUSCULOSKELETAL:  No edema; No  deformity  SKIN: Warm and dry NEUROLOGIC:  Alert and oriented x 3 PSYCHIATRIC:  Normal affect   ASSESSMENT:    1. CAD S/P percutaneous coronary angioplasty   2. Essential hypertension   3. Hyperlipidemia, unspecified hyperlipidemia type   4. Bradycardia    PLAN:    In order of problems listed above:  Coronary artery disease -2020 nuclear stress test reassuring.  Prior PCI to  LAD. -Continue with secondary risk factor prevention.  Essential hypertension -Has been struggling a little bit with high blood pressures at home. He has an Omron arm cuff and he has been getting in the [id number] range systolic. I checked his blood pressure again here and it was 158/90. Dr. Laurann Montana recently has noticed this and has started him on chlorthalidone 25 mg once a day. Encouraged him to go ahead and start this.  Bradycardia -Previously asymptomatic.  Prior left arm pain neck pain -Felt to be radicular. Stable. No longer.   Medication Adjustments/Labs and Tests Ordered: Current medicines are reviewed at length with the patient today.  Concerns regarding medicines are outlined above.  No orders of the defined types were placed in this encounter.  No orders of the defined types were placed in this encounter.   Patient Instructions  Medication Instructions:  Your physician recommends that you continue on your current medications as directed. Please refer to the Current Medication list given to you today.  *If you need a refill on your cardiac medications before your next appointment, please call your pharmacy*  Follow-Up: At [facility], you and your health needs are our priority.  As part of our continuing mission to provide you with exceptional heart care, we have created designated Provider Care Teams.  These Care Teams include your primary Cardiologist (physician) and Advanced Practice Providers (APPs -  Physician Assistants and Nurse Practitioners) who all work together to provide you with the  care you need, when you need it.  Your next appointment:   1 year(s)  The format for your next appointment:   In Person  Provider:   You may see Candee Furbish, MD or one of the following Advanced Practice Providers on your designated Care Team:    Truitt Merle, NP  Cecilie Kicks, NP  Kathyrn Drown, NP     Signed, Candee Furbish, MD  06/23/2019 9:48 AM    [place]

## 2019-08-18 ENCOUNTER — Other Ambulatory Visit: Payer: Self-pay

## 2019-10-08 ENCOUNTER — Other Ambulatory Visit: Payer: Self-pay | Admitting: Gastroenterology

## 2019-10-08 DIAGNOSIS — K2210 Ulcer of esophagus without bleeding: Secondary | ICD-10-CM

## 2020-02-17 ENCOUNTER — Ambulatory Visit: Payer: Medicare Other | Admitting: Urology

## 2020-02-24 ENCOUNTER — Other Ambulatory Visit (HOSPITAL_COMMUNITY): Payer: Self-pay | Admitting: Internal Medicine

## 2020-02-24 ENCOUNTER — Other Ambulatory Visit: Payer: Self-pay | Admitting: Internal Medicine

## 2020-02-24 DIAGNOSIS — R519 Headache, unspecified: Secondary | ICD-10-CM

## 2020-02-24 DIAGNOSIS — S0093XA Contusion of unspecified part of head, initial encounter: Secondary | ICD-10-CM | POA: Diagnosis not present

## 2020-02-25 DIAGNOSIS — H43391 Other vitreous opacities, right eye: Secondary | ICD-10-CM | POA: Diagnosis not present

## 2020-02-28 ENCOUNTER — Ambulatory Visit (HOSPITAL_COMMUNITY)
Admission: RE | Admit: 2020-02-28 | Discharge: 2020-02-28 | Disposition: A | Discharge status: Home / Self Care | Payer: Medicare Other | Source: Ambulatory Visit | Attending: Internal Medicine | Admitting: Internal Medicine

## 2020-02-28 ENCOUNTER — Other Ambulatory Visit: Payer: Self-pay

## 2020-02-28 DIAGNOSIS — R519 Headache, unspecified: Secondary | ICD-10-CM | POA: Diagnosis not present

## 2020-03-23 ENCOUNTER — Ambulatory Visit (INDEPENDENT_AMBULATORY_CARE_PROVIDER_SITE_OTHER): Payer: Medicare Other | Admitting: Urology

## 2020-03-23 ENCOUNTER — Encounter: Payer: Self-pay | Admitting: Urology

## 2020-03-23 ENCOUNTER — Other Ambulatory Visit: Payer: Self-pay

## 2020-03-23 VITALS — BP 143/57 | HR 60 | Temp 98.6°F | Ht 70.5 in | Wt 180.0 lb

## 2020-03-23 DIAGNOSIS — R319 Hematuria, unspecified: Secondary | ICD-10-CM

## 2020-03-23 DIAGNOSIS — N401 Enlarged prostate with lower urinary tract symptoms: Secondary | ICD-10-CM | POA: Insufficient documentation

## 2020-03-23 DIAGNOSIS — H43393 Other vitreous opacities, bilateral: Secondary | ICD-10-CM | POA: Diagnosis not present

## 2020-03-23 DIAGNOSIS — N138 Other obstructive and reflux uropathy: Secondary | ICD-10-CM

## 2020-03-23 DIAGNOSIS — H26493 Other secondary cataract, bilateral: Secondary | ICD-10-CM | POA: Diagnosis not present

## 2020-03-23 DIAGNOSIS — R3912 Poor urinary stream: Secondary | ICD-10-CM | POA: Insufficient documentation

## 2020-03-23 LAB — URINALYSIS, ROUTINE W REFLEX MICROSCOPIC
Bilirubin, UA: NEGATIVE
Ketones, UA: NEGATIVE
Nitrite, UA: NEGATIVE
Protein,UA: NEGATIVE
Specific Gravity, UA: 1.015 (ref 1.005–1.030)
Urobilinogen, Ur: 0.2 mg/dL (ref 0.2–1.0)
pH, UA: 5.5 (ref 5.0–7.5)

## 2020-03-23 LAB — MICROSCOPIC EXAMINATION
RBC: >30 /hpf — AB (ref 0–2)
Renal Epithel, UA: NONE SEEN /hpf

## 2020-03-23 MED ORDER — NITROFURANTOIN MONOHYD MACRO 100 MG PO CAPS: 100.0000 mg | ORAL_CAPSULE | Freq: Two times a day (BID) | ORAL | 5 refills | Status: DC

## 2020-03-23 NOTE — Progress Notes (Signed)
03/23/2020 3:08 PM   [name] [birth year] [id number]  Referring provider: Lavone Orn, MD [address] [place],  [place] [address]  Gross hematuria  HPI: [name] is a 70yo here today with new onset gross hematuria. Hematuria started 1 week ago and he took amoxicillin which failed to improve the hematuria. He has worsening dysuria, urinary frequency, urinary urgency. UA is concerning for infection. He is on flomax and finasteride.    PMH: Past Medical History:  Diagnosis Date  . Anxiety   . CAD (coronary artery disease)   . Coronary atherosclerosis of native coronary artery 01/29/2013  . Hx of seasonal allergies   . Hyperlipidemia   . Hypertension     Surgical History: Past Surgical History:  Procedure Laterality Date  . ANGIOPLASTY  2010   stent  . LUMBAR LAMINECTOMY  1991   L3 and T12    Home Medications:  Allergies as of 03/23/2020      Reactions   Chicken Allergy Shortness Of Breath, Nausea And Vomiting, Other (See Comments)   Headache SAME REACTION(S) TO [place]!!!!   Morphine Shortness Of Breath   Other Anaphylaxis   Ate a fruit salad and had to go to the E.R.   Pineapple Anaphylaxis, Rash   Sulfa Antibiotics Shortness Of Breath   Headache SAME REACTION(S) TO [place]!!!!   Naproxen Rash      Medication List       Accurate as of March 23, 2020  3:08 PM. If you have any questions, ask your nurse or doctor.        acetaminophen 500 MG tablet Commonly known as: TYLENOL Take 1,000 mg by mouth every 8 (eight) hours as needed for moderate pain.   amLODipine 5 MG tablet Commonly known as: NORVASC Take 5 mg by mouth daily.   aspirin EC 81 MG tablet Take 81 mg by mouth daily.   atorvastatin 40 MG tablet Commonly known as: LIPITOR Take 40 mg by mouth daily.   Biotin 5000 MCG Tabs 1 tablet   chlorthalidone 25 MG tablet Commonly known as: HYGROTON Take 25 mg by mouth daily.   citalopram 10 MG tablet Commonly known  as: CELEXA Take 10 mg by mouth daily.   Co Q 10 100 MG Caps Take 200 mg by mouth daily.   Co Q10 Maximum Strength 200 MG capsule Generic drug: Coenzyme Q10 See admin instructions.   finasteride 5 MG tablet Commonly known as: PROSCAR Take 5 mg by mouth daily.   fluconazole 150 MG tablet Commonly known as: DIFLUCAN Take 150 mg by mouth as needed.   gabapentin 300 MG capsule Commonly known as: NEURONTIN Take 600 mg by mouth at bedtime. And may take an additional 300 mg once a day if needed   HAIR SKIN AND NAILS FORMULA PO Take 1 tablet by mouth daily.   metFORMIN 500 MG 24 hr tablet Commonly known as: GLUCOPHAGE-XR Take 500 mg by mouth daily.   nitrofurantoin (macrocrystal-monohydrate) 100 MG capsule Commonly known as: MACROBID Take 1 capsule (100 mg total) by mouth 2 (two) times daily. Started by: Nicolette Bang, MD   nitroGLYCERIN 0.4 MG SL tablet Commonly known as: NITROSTAT Place 1 tablet (0.4 mg total) under the tongue every 5 (five) minutes x 3 doses as needed for chest pain.   pantoprazole 40 MG tablet Commonly known as: PROTONIX Take 1 tablet by mouth once daily   Saw Palmetto 450 MG Caps See admin instructions.   tamsulosin 0.4 MG Caps  capsule Commonly known as: FLOMAX Take 1 capsule by mouth at bedtime   traMADol 50 MG tablet Commonly known as: ULTRAM Take 50 mg by mouth daily as needed (for pain).   valsartan 80 MG tablet Commonly known as: DIOVAN TAKE 1 TABLET BY MOUTH DAILY   vitamin B-12 1000 MCG tablet Commonly known as: CYANOCOBALAMIN Take 1,000 mcg by mouth daily.   VITAMIN E PO Take 1 tablet by mouth daily.       Allergies:  Allergies  Allergen Reactions  . Chicken Allergy Shortness Of Breath, Nausea And Vomiting and Other (See Comments)    Headache SAME REACTION(S) TO [place]!!!!  . Morphine Shortness Of Breath  . Other Anaphylaxis    Ate a fruit salad and had to go to the E.R.  . Pineapple Anaphylaxis and Rash  . Sulfa  Antibiotics Shortness Of Breath    Headache SAME REACTION(S) TO [place]!!!!  . Naproxen Rash    Family History: Family History  Problem Relation Age of Onset  . Breast cancer Mother   . Lung cancer Father   . Colon cancer Neg Hx   . Stomach cancer Neg Hx   . Esophageal cancer Neg Hx   . Pancreatic cancer Neg Hx     Social History:  reports that he has never smoked. He has never used smokeless tobacco. He reports current alcohol use. He reports that he does not use drugs.  ROS: All other review of systems were reviewed and are negative except what is noted above in HPI  Physical Exam: BP (!) 143/57   Pulse 60   Temp 98.6 F (37 C)   Ht 5' 10.5" (1.791 m)   Wt 180 lb (81.6 kg)   BMI 25.46 kg/m   Constitutional:  Alert and oriented, No acute distress. HEENT: [place] AT, moist mucus membranes.  Trachea midline, no masses. Cardiovascular: No clubbing, cyanosis, or edema. Respiratory: Normal respiratory effort, no increased work of breathing. GI: Abdomen is soft, nontender, nondistended, no abdominal masses GU: No CVA tenderness.  Lymph: No cervical or inguinal lymphadenopathy. Skin: No rashes, bruises or suspicious lesions. Neurologic: Grossly intact, no focal deficits, moving all 4 extremities. Psychiatric: Normal mood and affect.  Laboratory Data: Lab Results  Component Value Date   WBC 4.1 06/27/2018   HGB 15.0 06/27/2018   HCT 44.8 06/27/2018   MCV 92.2 06/27/2018   PLT 80 (L) 06/27/2018    Lab Results  Component Value Date   CREATININE 1.13 06/27/2018    No results found for: PSA  No results found for: TESTOSTERONE  No results found for: HGBA1C  Urinalysis    Component Value Date/Time   COLORURINE YELLOW 06/27/2018 1120   APPEARANCEUR Cloudy (A) 03/23/2020 1438   LABSPEC 1.011 06/27/2018 1120   PHURINE 5.0 06/27/2018 1120   GLUCOSEU Trace (A) 03/23/2020 1438   HGBUR NEGATIVE 06/27/2018 1120   BILIRUBINUR Negative 03/23/2020 1438   KETONESUR NEGATIVE  06/27/2018 1120   PROTEINUR Negative 03/23/2020 1438   PROTEINUR NEGATIVE 06/27/2018 1120   NITRITE Negative 03/23/2020 1438   NITRITE NEGATIVE 06/27/2018 1120   LEUKOCYTESUR 2+ (A) 03/23/2020 1438   LEUKOCYTESUR NEGATIVE 06/27/2018 1120    Lab Results  Component Value Date   LABMICR See below: 03/23/2020   WBCUA 11-30 (A) 03/23/2020   LABEPIT 0-10 03/23/2020   BACTERIA Many (A) 03/23/2020    Pertinent Imaging:  No results found for this or any previous visit.  No results found for this or any previous visit.  No results found for this or any previous visit.  No results found for this or any previous visit.  Results for orders placed during the hospital encounter of 01/30/12  US Renal  Narrative *RADIOLOGY REPORT*  Clinical Data:  Lower back and abdominal pain.  Pyelonephritis. History of kidney stones.  RENAL/URINARY TRACT ULTRASOUND COMPLETE  Comparison:  None.  Findings:  Right Kidney:  Right kidney is normal in size and echogenicity with no mass or hydronephrosis.  The right kidney measures 11.6 cm.  Left Kidney:  Normal in size and parenchymal echogenicity.  No evidence of mass or hydronephrosis.  The left kidney measures 10.9 cm.  Bladder:  Appears normal for degree of bladder distention.  IMPRESSION: Normal study.   Original Report Authenticated By: Lajean Manes, M.D.  No results found for this or any previous visit.  No results found for this or any previous visit.  No results found for this or any previous visit.   Assessment & Plan:    1. Hematuria, unspecified type -Likely related to UTI. We will start macrobid.  - Urinalysis, Routine w reflex microscopic  2. Benign prostatic hyperplasia with urinary obstruction -Continue flomax   3. Weak urinary stream Continue flomax and finasteride   No follow-ups on file.  Nicolette Bang, MD  [facility] [place]

## 2020-03-23 NOTE — Patient Instructions (Signed)

## 2020-03-23 NOTE — Progress Notes (Signed)
Urological Symptom Review  Patient is experiencing the following symptoms: Frequent urination Get up at night to urinate Blood in urine Weak stream   Review of Systems  Gastrointestinal (upper)  : Negative for upper GI symptoms  Gastrointestinal (lower) : Diarrhea  Constitutional : Negative for symptoms  Skin: Negative for skin symptoms  Eyes: Negative for eye symptoms  Ear/Nose/Throat : Negative for Ear/Nose/Throat symptoms  Hematologic/Lymphatic: Negative for Hematologic/Lymphatic symptoms  Cardiovascular : Negative for cardiovascular symptoms  Respiratory : Negative for respiratory symptoms  Endocrine: Negative for endocrine symptoms  Musculoskeletal: Negative for musculoskeletal symptoms  Neurological: Negative for neurological symptoms  Psychologic: Negative for psychiatric symptoms

## 2020-03-28 LAB — URINE CULTURE

## 2020-03-30 NOTE — Progress Notes (Signed)
Dr. Alyson Ingles sent in [facility] with refills enough to cover this time prior to result.

## 2020-04-03 ENCOUNTER — Ambulatory Visit: Payer: Medicare Other | Admitting: Urology

## 2020-05-10 ENCOUNTER — Encounter: Payer: Self-pay | Admitting: Physician Assistant

## 2020-05-10 ENCOUNTER — Encounter: Payer: Self-pay | Admitting: *Deleted

## 2020-05-10 ENCOUNTER — Telehealth: Payer: Self-pay | Admitting: Cardiology

## 2020-05-10 ENCOUNTER — Ambulatory Visit: Payer: Medicare Other | Admitting: Physician Assistant

## 2020-05-10 ENCOUNTER — Other Ambulatory Visit: Payer: Self-pay

## 2020-05-10 VITALS — BP 130/50 | HR 60 | Ht 71.0 in | Wt 184.2 lb

## 2020-05-10 DIAGNOSIS — I25110 Atherosclerotic heart disease of native coronary artery with unstable angina pectoris: Secondary | ICD-10-CM | POA: Diagnosis not present

## 2020-05-10 DIAGNOSIS — E7800 Pure hypercholesterolemia, unspecified: Secondary | ICD-10-CM | POA: Diagnosis not present

## 2020-05-10 DIAGNOSIS — K2100 Gastro-esophageal reflux disease with esophagitis, without bleeding: Secondary | ICD-10-CM | POA: Diagnosis not present

## 2020-05-10 DIAGNOSIS — I10 Essential (primary) hypertension: Secondary | ICD-10-CM

## 2020-05-10 MED ORDER — ISOSORBIDE MONONITRATE ER 30 MG PO TB24: 15.0000 mg | ORAL_TABLET | Freq: Every day | ORAL | 3 refills | Status: DC

## 2020-05-10 MED ORDER — NITROGLYCERIN 0.4 MG SL SUBL: 0.4000 mg | SUBLINGUAL_TABLET | SUBLINGUAL | 3 refills | Status: AC | PRN

## 2020-05-10 NOTE — Telephone Encounter (Signed)
Spoke with pt who reports increasing SOB on exertion x 1 month.  Pt also reports an episode of CP last week with nausea.  Pt did not try nitro for relief.  He states it eventually resolved.  Denies current active CP.  Pt taking other medications as prescribed.  Pt is due to be seen by Dr Marlou Porch.  Appointment scheduled for today with Richardson Dopp, PA-C for further evaluation.  Pt verbalizes understanding and agrees with current plan.

## 2020-05-10 NOTE — Patient Instructions (Signed)
Medication Instructions:  Your physician has recommended you make the following change in your medication:   1.  Start imdur one half tablet by mouth ( 15 mg) daily, sent into requested pharmacy.  *If you need a refill on your cardiac medications before your next appointment, please call your pharmacy*   Lab Work: Your physician recommends that you have lab work today: bmet/cbc  If you have labs (blood work) drawn today and your tests are completely normal, you will receive your results only by: Marland Kitchen MyChart Message (if you have MyChart) OR . A paper copy in the mail If you have any lab test that is abnormal or we need to change your treatment, we will call you to review the results.   Testing/Procedures:  Your provider has recommended a cardiac catherization.   You will need a COVID test prior to your procedure - Go to Pre-Procedural COVID -19 testing at [address] in [place], [place] [address] on Saturday, March 26 @ 11:55 am_.     You are scheduled for a cardiac catheterization on Tuesday, March 29  with Dr. Irish Lack  or associate.  Please arrive at the [facility] (Main Entrance) at [facility] at [address] [place] on Tuesday, March 29 at 5:30 am.  Free valet parking is available.   You are allowed only one visitor in the hospital with you. Both you and your visitor must wear masks.    Special note: Every effort is made to have your procedure done on time.   Please understand that emergencies sometimes delay a scheduled   procedure.  No solid foods after midnight on Monday, March 28. You may have clear liquids until 5 am on the day of your procedure.  On the morning of your procedure, take your medication except metformin.   You may take your morning medications with a sip of water on the day of your procedure.  Please take a baby aspirin (81 mg) on the morning of your procedure.   Medications to HOLD - Metformin 24 hours before  cath and 48 hours after cath.   Plan for a one night stay -- bring personal belongings.  Bring a current list of your medications and current insurance cards.  You MUST have a responsible person to drive you home. Someone MUST be with you the first 24 hours after you arrive home or your discharge will be delayed. Wear clothes that are easy to get on and off and wear slip on shoes.    Coronary Angiogram A coronary angiogram, also called coronary angiography, is an X-ray procedure used to look at the arteries in the heart. In this procedure, a dye (contrast dye) is injected through a long, hollow tube (catheter). The catheter is about the size of a piece of cooked spaghetti and is inserted through your groin, wrist, or arm. The dye is injected into each artery, and X-rays are then taken to show if there is a blockage in the arteries of your heart.  LET [facility] CARE PROVIDER KNOW ABOUT:  Any allergies you have, including allergies to shellfish or contrast dye.    All medicines you are taking, including vitamins, herbs, eye drops, creams, and over-the-counter medicines.    Previous problems you or members of your family have had with the use of anesthetics.    Any blood disorders you have.    Previous surgeries you have had.  History of kidney problems or  failure.    Other medical conditions you have.  RISKS AND COMPLICATIONS  Generally, a coronary angiogram is a safe procedure. However, about 1 person out of 1000 can have problems that may include:  Allergic reaction to the dye.  Bleeding/bruising from the access site or other locations.  Kidney injury, especially in people with impaired kidney function.   Stroke (rare).  Heart attack (rare).  Irregular rhythms (rare)  Death (rare)  BEFORE THE PROCEDURE   Do not eat or drink anything after midnight the night before the procedure or as directed by your health care provider.    Ask your health care provider about  changing or stopping your regular medicines. This is especially important if you are taking diabetes medicines or blood thinners.  PROCEDURE  You may be given a medicine to help you relax (sedative) before the procedure. This medicine is given through an intravenous (IV) access tube that is inserted into one of your veins.    The area where the catheter will be inserted will be washed and shaved. This is usually done in the groin but may be done in the fold of your arm (near your elbow) or in the wrist.     A medicine will be given to numb the area where the catheter will be inserted (local anesthetic).    The health care provider will insert the catheter into an artery. The catheter will be guided by using a special type of X-ray (fluoroscopy) of the blood vessel being examined.    A special dye will then be injected into the catheter, and X-rays will be taken. The dye will help to show where any narrowing or blockages are located in the heart arteries.     AFTER THE PROCEDURE   If the procedure is done through the leg, you will be kept in bed lying flat for several hours. You will be instructed to not bend or cross your legs.  The insertion site will be checked frequently.    The pulse in your feet or wrist will be checked frequently.    Additional blood tests, X-rays, and an electrocardiogram may be done.       Follow-Up: At [facility], you and your health needs are our priority.  As part of our continuing mission to provide you with exceptional heart care, we have created designated Provider Care Teams.  These Care Teams include your primary Cardiologist (physician) and Advanced Practice Providers (APPs -  Physician Assistants and Nurse Practitioners) who all work together to provide you with the care you need, when you need it.  We recommend signing up for the patient portal called "MyChart".  Sign up information is provided on this After Visit Summary.  MyChart is used to  connect with patients for Virtual Visits (Telemedicine).  Patients are able to view lab/test results, encounter notes, upcoming appointments, etc.  Non-urgent messages can be sent to your provider as well.   To learn more about what you can do with MyChart, go to [email or web address].    Your next appointment:   2 week(s)  The format for your next appointment:   In Person  Provider:   Richardson Dopp, PA-C   Other Instructions

## 2020-05-10 NOTE — H&P (View-Only) (Signed)
Cardiology Office Note:    Date:  05/10/2020   ID:  [name], DOB August 23, 1950, MRN [id number]  PCP:  Lavone Orn, [place]  Cardiologist:  Candee Furbish, MD  Electrophysiologist:  None       Referring MD: Lavone Orn, MD   Chief Complaint:  Shortness of Breath and Chest Pain    Patient Profile:    [name] is a 70 y.o. male with:   Coronary artery disease   S/p DES to LAD in 2010  Myoview 09/2018: Low risk  Hypertension   Hyperlipidemia   Diabetes mellitus   Lumbar disc disease   Anxiety    Prior CV studies: Myoview 10/08/2018 EF 64, normal perfusion; low risk  Cardiac catheterization 07/12/2008 LM normal LAD proximal 95, mid 50-60; D1 ostial 50 LCx normal RCA mid 40-50 EF 60 PCI: Taxus DES to the proximal LAD  History of Present Illness:    [name] was last seen by Dr. Marlou Porch in 5/21.  He called in today with symptoms of shortness of breath for the past week.  He is added on for evaluation. He is here with his wife.  He notes progressively worsening exercise intolerance, exertional shortness of breath and L sided chest pain over the past 2 weeks.  His symptoms are similar to his anginal symptoms at the time of his PCI in 2010.  He has been lightheaded and nauseated at times.  He has not had syncope, orthopnea, paroxysmal nocturnal dyspnea, leg edema.        Past Medical History:  Diagnosis Date  . Anxiety   . CAD (coronary artery disease)    s/p Taxus DES to LAD in 2010 // Myoview 09/2018: low risk, normal EF   . DM2 (diabetes mellitus, type 2) ([place])   . Hx of seasonal allergies   . Hyperlipidemia   . Hypertension     Current Medications: Current Meds  Medication Sig  . acetaminophen (TYLENOL) 500 MG tablet Take 1,000 mg by mouth every 8 (eight) hours as needed for moderate pain.   Marland Kitchen amLODipine (NORVASC) 5 MG tablet Take 5 mg by mouth daily.  Marland Kitchen aspirin EC 81 MG tablet Take 81 mg by mouth  daily.  Marland Kitchen atorvastatin (LIPITOR) 40 MG tablet Take 40 mg by mouth daily.   . Biotin 5000 MCG TABS 1 tablet  . chlorthalidone (HYGROTON) 25 MG tablet Take 25 mg by mouth daily.  . citalopram (CELEXA) 10 MG tablet Take 10 mg by mouth daily.  . Coenzyme Q10 (CO Q 10) 100 MG CAPS Take 200 mg by mouth daily.  . finasteride (PROSCAR) 5 MG tablet Take 5 mg by mouth daily.  . fluconazole (DIFLUCAN) 150 MG tablet Take 150 mg by mouth as needed.  . gabapentin (NEURONTIN) 300 MG capsule Take 600 mg by mouth at bedtime. And may take an additional 300 mg once a day if needed  . isosorbide mononitrate (IMDUR) 30 MG 24 hr tablet Take 0.5 tablets (15 mg total) by mouth daily.  . metFORMIN (GLUCOPHAGE-XR) 500 MG 24 hr tablet Take 500 mg by mouth daily.  . Multiple Vitamins-Minerals (HAIR SKIN AND NAILS FORMULA PO) Take 1 tablet by mouth daily.  . nitroGLYCERIN (NITROSTAT) 0.4 MG SL tablet Place 1 tablet (0.4 mg total) under the tongue every 5 (five) minutes x 3 doses as needed for chest pain.  . nitroGLYCERIN (NITROSTAT) 0.4 MG SL tablet Place 1 tablet (0.4 mg total) under the  tongue every 5 (five) minutes as needed for chest pain.  . pantoprazole (PROTONIX) 40 MG tablet Take 1 tablet by mouth once daily  . Saw Palmetto 450 MG CAPS See admin instructions.  . tamsulosin (FLOMAX) 0.4 MG CAPS capsule Take 1 capsule by mouth at bedtime  . traMADol (ULTRAM) 50 MG tablet Take 50 mg by mouth daily as needed (for pain).   . valsartan (DIOVAN) 80 MG tablet TAKE 1 TABLET BY MOUTH DAILY  . vitamin B-12 (CYANOCOBALAMIN) 1000 MCG tablet Take 1,000 mcg by mouth daily.   Current Facility-Administered Medications for the 05/10/20 encounter (Office Visit) with Richardson Dopp T, PA-C  Medication  . 0.9 %  sodium chloride infusion     Allergies:   Chicken allergy, Morphine, Other, Pineapple, Sulfa antibiotics, and Naproxen   Social History   Tobacco Use  . Smoking status: Never Smoker  . Smokeless tobacco: Never Used   Substance Use Topics  . Alcohol use: Yes    Comment: beer once or twice a month  . Drug use: No     Family Hx: The patient's family history includes Breast cancer in his mother; Lung cancer in his father. There is no history of Colon cancer, Stomach cancer, Esophageal cancer, or Pancreatic cancer.  Review of Systems  Constitutional: Positive for chills. Negative for fever.  Respiratory: Negative for cough.   Gastrointestinal: Positive for diarrhea and nausea. Negative for hematochezia and melena.  Genitourinary: Negative for hematuria.       +recent prostatitis - finishing antibiotics   All other systems reviewed and are negative.    EKGs/Labs/Other Test Reviewed:    EKG:  EKG is   ordered today.  The ekg ordered today demonstrates sinus bradycardia, HR 57, normal axis, no acute ST-T wave changes, QTC 381  Recent Labs: No results found for requested labs within last 8760 hours.   Recent Lipid Panel Lab Results  Component Value Date/Time   CHOL 150 10/31/2015 11:37 AM   TRIG 201 (H) 10/31/2015 11:37 AM   HDL 40 (L) 10/31/2015 11:37 AM   CHOLHDL 3.8 10/31/2015 11:37 AM   LDLCALC 70 10/31/2015 11:37 AM      Risk Assessment/Calculations:      Physical Exam:    VS:  BP (!) 130/50   Pulse 60   Ht 5\' 11"  (1.803 m)   Wt 184 lb 3.2 oz (83.6 kg)   SpO2 96%   BMI 25.69 kg/m     Wt Readings from Last 3 Encounters:  05/10/20 184 lb 3.2 oz (83.6 kg)  03/23/20 180 lb (81.6 kg)  06/23/19 182 lb (82.6 kg)     Constitutional:      Appearance: Healthy appearance. Not in distress.  Neck:     Vascular: No JVR. JVD normal.     Lymphadenopathy: No cervical adenopathy.  Pulmonary:     Effort: Pulmonary effort is normal.     Breath sounds: No wheezing. No rales.  Cardiovascular:     Normal rate. Regular rhythm. Normal S1. Normal S2.     Murmurs: There is no murmur.  Pulses:    Intact distal pulses.  Edema:    Peripheral edema absent.  Abdominal:     Palpations:  Abdomen is soft. There is no hepatomegaly.     Tenderness: There is abdominal tenderness in the epigastric area.  Skin:    General: Skin is warm and dry.  Neurological:     General: No focal deficit present.     Mental Status:  Alert and oriented to person, place and time.     Cranial Nerves: Cranial nerves are intact.       ASSESSMENT & PLAN:    1. 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  Myoview in 2020 was low risk.  He now presents with progressively worsening exertional shortness of breath, exercise intolerance and left-sided chest discomfort.  This is somewhat reminiscent of his previous angina.  His electrocardiogram does not demonstrate any acute changes.  Given his history, I recommend proceeding with cardiac catheterization to further evaluate his symptoms.  I reviewed this with Dr. Rayann Heman (attending MD) who agreed.  He does not take PDE-5 inhibitors.  Heart rate is too slow to add beta-blocker therapy.  -Add isosorbide 15 mg daily  -NTG as needed Rx  -Arrange cardiac catheterization  -BMET, CBC, SARS-CoV-2   -Continue aspirin, statin, amlodipine  -Follow-up 2 weeks post cath  2. Essential hypertension The patient's blood pressure is controlled on his current regimen.  Continue current therapy.   3. Pure hypercholesterolemia Continue high intensity statin therapy.  4. Gastroesophageal reflux disease with esophagitis without hemorrhage He does note some abdominal discomfort as well as GI upset over the past couple weeks.  He attributes this to something he ate recently.  He has not really had any symptoms of bleeding.  CBC will be obtained today with his precath labs.  If his hemoglobin has dropped, we will need to place his cardiac catheterization on hold and have him see gastroenterology.    Shared Decision Making/Informed Consent The risks [stroke (1 in 1000), death  (1 in 1000), kidney failure [usually temporary] (1 in 500), bleeding (1 in 200), allergic reaction [possibly serious] (1 in 200)], benefits (diagnostic support and management of coronary artery disease) and alternatives of a cardiac catheterization were discussed in detail with Mr. Eastridge and he is willing to proceed.   Dispo:  Return in about 2 weeks (around 05/24/2020) for Post Procedure Follow Up.   Medication Adjustments/Labs and Tests Ordered: Current medicines are reviewed at length with the patient today.  Concerns regarding medicines are outlined above.  Tests Ordered: Orders Placed This Encounter  Procedures  . Basic Metabolic Panel (BMET)  . CBC  . EKG 12-Lead   Medication Changes: Meds ordered this encounter  Medications  . isosorbide mononitrate (IMDUR) 30 MG 24 hr tablet    Sig: Take 0.5 tablets (15 mg total) by mouth daily.    Dispense:  45 tablet    Refill:  3  . nitroGLYCERIN (NITROSTAT) 0.4 MG SL tablet    Sig: Place 1 tablet (0.4 mg total) under the tongue every 5 (five) minutes as needed for chest pain.    Dispense:  25 tablet    Refill:  3    Signed, Richardson Dopp, PA-C  05/10/2020 3:18 PM    [facility] [address], [place], [place]  [address] Phone: [phone]; Fax: [phone]

## 2020-05-10 NOTE — Telephone Encounter (Signed)
Pt c/o Shortness Of Breath: STAT if SOB developed within the last 24 hours or pt is noticeably SOB on the phone  1. Are you currently SOB (can you hear that pt is SOB on the phone)? No   2. How long have you been experiencing SOB? About 1 month, per patient's wife   3. Are you SOB when sitting or when up moving around? When up and moving around  4. Are you currently experiencing any other symptoms? No

## 2020-05-10 NOTE — Telephone Encounter (Signed)
Patient's wife states the patient has also been having headaches.

## 2020-05-10 NOTE — Progress Notes (Signed)
Cardiology Office Note:    Date:  05/10/2020   ID:  [name], DOB [birth year], MRN [id number]  PCP:  Lavone Orn, [place]  Cardiologist:  Candee Furbish, MD  Electrophysiologist:  None       Referring MD: Lavone Orn, MD   Chief Complaint:  Shortness of Breath and Chest Pain    Patient Profile:    [name] is a 70 y.o. male with:   Coronary artery disease   S/p DES to LAD in 2010  Myoview 09/2018: Low risk  Hypertension   Hyperlipidemia   Diabetes mellitus   Lumbar disc disease   Anxiety    Prior CV studies: Myoview 10/08/2018 EF 64, normal perfusion; low risk  Cardiac catheterization 07/12/2008 LM normal LAD proximal 95, mid 50-60; D1 ostial 50 LCx normal RCA mid 40-50 EF 60 PCI: Taxus DES to the proximal LAD  History of Present Illness:    [name] was last seen by Dr. Marlou Porch in 5/21.  He called in today with symptoms of shortness of breath for the past week.  He is added on for evaluation. He is here with his wife.  He notes progressively worsening exercise intolerance, exertional shortness of breath and L sided chest pain over the past 2 weeks.  His symptoms are similar to his anginal symptoms at the time of his PCI in 2010.  He has been lightheaded and nauseated at times.  He has not had syncope, orthopnea, paroxysmal nocturnal dyspnea, leg edema.        Past Medical History:  Diagnosis Date  . Anxiety   . CAD (coronary artery disease)    s/p Taxus DES to LAD in 2010 // Myoview 09/2018: low risk, normal EF   . DM2 (diabetes mellitus, type 2) ([place])   . Hx of seasonal allergies   . Hyperlipidemia   . Hypertension     Current Medications: Current Meds  Medication Sig  . acetaminophen (TYLENOL) 500 MG tablet Take 1,000 mg by mouth every 8 (eight) hours as needed for moderate pain.   Marland Kitchen amLODipine (NORVASC) 5 MG tablet Take 5 mg by mouth daily.  Marland Kitchen aspirin EC 81 MG tablet Take 81 mg by mouth  daily.  Marland Kitchen atorvastatin (LIPITOR) 40 MG tablet Take 40 mg by mouth daily.   . Biotin 5000 MCG TABS 1 tablet  . chlorthalidone (HYGROTON) 25 MG tablet Take 25 mg by mouth daily.  . citalopram (CELEXA) 10 MG tablet Take 10 mg by mouth daily.  . Coenzyme Q10 (CO Q 10) 100 MG CAPS Take 200 mg by mouth daily.  . finasteride (PROSCAR) 5 MG tablet Take 5 mg by mouth daily.  . fluconazole (DIFLUCAN) 150 MG tablet Take 150 mg by mouth as needed.  . gabapentin (NEURONTIN) 300 MG capsule Take 600 mg by mouth at bedtime. And may take an additional 300 mg once a day if needed  . isosorbide mononitrate (IMDUR) 30 MG 24 hr tablet Take 0.5 tablets (15 mg total) by mouth daily.  . metFORMIN (GLUCOPHAGE-XR) 500 MG 24 hr tablet Take 500 mg by mouth daily.  . Multiple Vitamins-Minerals (HAIR SKIN AND NAILS FORMULA PO) Take 1 tablet by mouth daily.  . nitroGLYCERIN (NITROSTAT) 0.4 MG SL tablet Place 1 tablet (0.4 mg total) under the tongue every 5 (five) minutes x 3 doses as needed for chest pain.  . nitroGLYCERIN (NITROSTAT) 0.4 MG SL tablet Place 1 tablet (0.4 mg total) under the  tongue every 5 (five) minutes as needed for chest pain.  . pantoprazole (PROTONIX) 40 MG tablet Take 1 tablet by mouth once daily  . Saw Palmetto 450 MG CAPS See admin instructions.  . tamsulosin (FLOMAX) 0.4 MG CAPS capsule Take 1 capsule by mouth at bedtime  . traMADol (ULTRAM) 50 MG tablet Take 50 mg by mouth daily as needed (for pain).   . valsartan (DIOVAN) 80 MG tablet TAKE 1 TABLET BY MOUTH DAILY  . vitamin B-12 (CYANOCOBALAMIN) 1000 MCG tablet Take 1,000 mcg by mouth daily.   Current Facility-Administered Medications for the 05/10/20 encounter (Office Visit) with Richardson Dopp T, PA-C  Medication  . 0.9 %  sodium chloride infusion     Allergies:   Chicken allergy, Morphine, Other, Pineapple, Sulfa antibiotics, and Naproxen   Social History   Tobacco Use  . Smoking status: Never Smoker  . Smokeless tobacco: Never Used   Substance Use Topics  . Alcohol use: Yes    Comment: beer once or twice a month  . Drug use: No     Family Hx: The patient's family history includes Breast cancer in his mother; Lung cancer in his father. There is no history of Colon cancer, Stomach cancer, Esophageal cancer, or Pancreatic cancer.  Review of Systems  Constitutional: Positive for chills. Negative for fever.  Respiratory: Negative for cough.   Gastrointestinal: Positive for diarrhea and nausea. Negative for hematochezia and melena.  Genitourinary: Negative for hematuria.       +recent prostatitis - finishing antibiotics   All other systems reviewed and are negative.    EKGs/Labs/Other Test Reviewed:    EKG:  EKG is   ordered today.  The ekg ordered today demonstrates sinus bradycardia, HR 57, normal axis, no acute ST-T wave changes, QTC 381  Recent Labs: No results found for requested labs within last 8760 hours.   Recent Lipid Panel Lab Results  Component Value Date/Time   CHOL 150 10/31/2015 11:37 AM   TRIG 201 (H) 10/31/2015 11:37 AM   HDL 40 (L) 10/31/2015 11:37 AM   CHOLHDL 3.8 10/31/2015 11:37 AM   LDLCALC 70 10/31/2015 11:37 AM      Risk Assessment/Calculations:      Physical Exam:    VS:  BP (!) 130/50   Pulse 60   Ht 5\' 11"  (1.803 m)   Wt 184 lb 3.2 oz (83.6 kg)   SpO2 96%   BMI 25.69 kg/m     Wt Readings from Last 3 Encounters:  05/10/20 184 lb 3.2 oz (83.6 kg)  03/23/20 180 lb (81.6 kg)  06/23/19 182 lb (82.6 kg)     Constitutional:      Appearance: Healthy appearance. Not in distress.  Neck:     Vascular: No JVR. JVD normal.     Lymphadenopathy: No cervical adenopathy.  Pulmonary:     Effort: Pulmonary effort is normal.     Breath sounds: No wheezing. No rales.  Cardiovascular:     Normal rate. Regular rhythm. Normal S1. Normal S2.     Murmurs: There is no murmur.  Pulses:    Intact distal pulses.  Edema:    Peripheral edema absent.  Abdominal:     Palpations:  Abdomen is soft. There is no hepatomegaly.     Tenderness: There is abdominal tenderness in the epigastric area.  Skin:    General: Skin is warm and dry.  Neurological:     General: No focal deficit present.     Mental Status:  Alert and oriented to person, place and time.     Cranial Nerves: Cranial nerves are intact.       ASSESSMENT & PLAN:    1. Coronary artery disease involving native coronary artery of native heart with unstable angina pectoris ([place]) History of stenting to the LAD in 2010.  Myoview in 2020 was low risk.  He now presents with progressively worsening exertional shortness of breath, exercise intolerance and left-sided chest discomfort.  This is somewhat reminiscent of his previous angina.  His electrocardiogram does not demonstrate any acute changes.  Given his history, I recommend proceeding with cardiac catheterization to further evaluate his symptoms.  I reviewed this with Dr. Rayann Heman (attending MD) who agreed.  He does not take PDE-5 inhibitors.  Heart rate is too slow to add beta-blocker therapy.  -Add isosorbide 15 mg daily  -NTG as needed Rx  -Arrange cardiac catheterization  -BMET, CBC, SARS-CoV-2   -Continue aspirin, statin, amlodipine  -Follow-up 2 weeks post cath  2. Essential hypertension The patient's blood pressure is controlled on his current regimen.  Continue current therapy.   3. Pure hypercholesterolemia Continue high intensity statin therapy.  4. Gastroesophageal reflux disease with esophagitis without hemorrhage He does note some abdominal discomfort as well as GI upset over the past couple weeks.  He attributes this to something he ate recently.  He has not really had any symptoms of bleeding.  CBC will be obtained today with his precath labs.  If his hemoglobin has dropped, we will need to place his cardiac catheterization on hold and have him see gastroenterology.    Shared Decision Making/Informed Consent The risks [stroke (1 in 1000), death  (1 in 1000), kidney failure [usually temporary] (1 in 500), bleeding (1 in 200), allergic reaction [possibly serious] (1 in 200)], benefits (diagnostic support and management of coronary artery disease) and alternatives of a cardiac catheterization were discussed in detail with Mr. Chance and he is willing to proceed.   Dispo:  Return in about 2 weeks (around 05/24/2020) for Post Procedure Follow Up.   Medication Adjustments/Labs and Tests Ordered: Current medicines are reviewed at length with the patient today.  Concerns regarding medicines are outlined above.  Tests Ordered: Orders Placed This Encounter  Procedures  . Basic Metabolic Panel (BMET)  . CBC  . EKG 12-Lead   Medication Changes: Meds ordered this encounter  Medications  . isosorbide mononitrate (IMDUR) 30 MG 24 hr tablet    Sig: Take 0.5 tablets (15 mg total) by mouth daily.    Dispense:  45 tablet    Refill:  3  . nitroGLYCERIN (NITROSTAT) 0.4 MG SL tablet    Sig: Place 1 tablet (0.4 mg total) under the tongue every 5 (five) minutes as needed for chest pain.    Dispense:  25 tablet    Refill:  3    Signed, Richardson Dopp, PA-C  05/10/2020 3:18 PM    [facility] [address], [place], [place]  [address] Phone: [phone]; Fax: [phone]

## 2020-05-11 LAB — BASIC METABOLIC PANEL
BUN/Creatinine Ratio: 16 (ref 10–24)
BUN: 16 mg/dL (ref 8–27)
CO2: 22 mmol/L (ref 20–29)
Calcium: 9.1 mg/dL (ref 8.6–10.2)
Chloride: 100 mmol/L (ref 96–106)
Creatinine, Ser: 1.01 mg/dL (ref 0.76–1.27)
Glucose: 205 mg/dL — ABNORMAL HIGH (ref 65–99)
Potassium: 4.4 mmol/L (ref 3.5–5.2)
Sodium: 137 mmol/L (ref 134–144)
eGFR: 79 mL/min/{1.73_m2} (ref >59)

## 2020-05-11 LAB — CBC
Hematocrit: 39.9 % (ref 37.5–51.0)
Hemoglobin: 14.2 g/dL (ref 13.0–17.7)
MCH: 31.9 pg (ref 26.6–33.0)
MCHC: 35.6 g/dL (ref 31.5–35.7)
MCV: 90 fL (ref 79–97)
Platelets: 118 10*3/uL — ABNORMAL LOW (ref 150–450)
RBC: 4.45 x10E6/uL (ref 4.14–5.80)
RDW: 13.1 % (ref 11.6–15.4)
WBC: 5.1 10*3/uL (ref 3.4–10.8)

## 2020-05-13 ENCOUNTER — Other Ambulatory Visit (HOSPITAL_COMMUNITY)
Admission: RE | Admit: 2020-05-13 | Discharge: 2020-05-13 | Disposition: A | Discharge status: Home / Self Care | Payer: Medicare Other | Source: Ambulatory Visit | Attending: Interventional Cardiology | Admitting: Interventional Cardiology

## 2020-05-13 DIAGNOSIS — Z01812 Encounter for preprocedural laboratory examination: Secondary | ICD-10-CM | POA: Diagnosis not present

## 2020-05-13 DIAGNOSIS — Z20822 Contact with and (suspected) exposure to covid-19: Secondary | ICD-10-CM | POA: Insufficient documentation

## 2020-05-13 LAB — SARS CORONAVIRUS 2 (TAT 6-24 HRS): SARS Coronavirus 2: NEGATIVE

## 2020-05-15 ENCOUNTER — Telehealth: Payer: Self-pay | Admitting: *Deleted

## 2020-05-15 NOTE — Telephone Encounter (Signed)
Pt contacted pre-catheterization scheduled at [facility] for: Tuesday May 16, 2020 7:30 AM Verified arrival time and place: [facility]) at: 5:30 AM   No solid food after midnight prior to cath, clear liquids until 5 AM day of procedure.  Hold: Metformin-day of procedure and 48 hours post procedure  Except hold medications AM meds can be  taken pre-cath with sips of water including: ASA 81 mg   Confirmed patient has responsible adult to drive home post procedure and be with patient first 24 hours after arriving home: yes  You are allowed ONE visitor in the waiting room during the time you are at the hospital for your procedure. Both you and your visitor must wear a mask once you enter the hospital.   Reviewed procedure/mask/visitor instructions with patient.

## 2020-05-16 ENCOUNTER — Other Ambulatory Visit: Payer: Self-pay

## 2020-05-16 ENCOUNTER — Encounter (HOSPITAL_COMMUNITY): Payer: Self-pay | Admitting: Interventional Cardiology

## 2020-05-16 ENCOUNTER — Ambulatory Visit (HOSPITAL_COMMUNITY)
Admission: RE | Admit: 2020-05-16 | Discharge: 2020-05-16 | Disposition: A | Discharge status: Home / Self Care | Payer: Medicare Other | Attending: Interventional Cardiology | Admitting: Interventional Cardiology

## 2020-05-16 ENCOUNTER — Other Ambulatory Visit: Payer: Self-pay | Admitting: Physician Assistant

## 2020-05-16 ENCOUNTER — Encounter (HOSPITAL_COMMUNITY)
Admission: RE | Disposition: A | Discharge status: Home / Self Care | Payer: Self-pay | Source: Home / Self Care | Attending: Interventional Cardiology

## 2020-05-16 DIAGNOSIS — Z885 Allergy status to narcotic agent status: Secondary | ICD-10-CM | POA: Diagnosis not present

## 2020-05-16 DIAGNOSIS — E119 Type 2 diabetes mellitus without complications: Secondary | ICD-10-CM | POA: Diagnosis not present

## 2020-05-16 DIAGNOSIS — R319 Hematuria, unspecified: Secondary | ICD-10-CM

## 2020-05-16 DIAGNOSIS — Z79899 Other long term (current) drug therapy: Secondary | ICD-10-CM | POA: Insufficient documentation

## 2020-05-16 DIAGNOSIS — Z882 Allergy status to sulfonamides status: Secondary | ICD-10-CM | POA: Insufficient documentation

## 2020-05-16 DIAGNOSIS — I25110 Atherosclerotic heart disease of native coronary artery with unstable angina pectoris: Secondary | ICD-10-CM

## 2020-05-16 DIAGNOSIS — Z95820 Peripheral vascular angioplasty status with implants and grafts: Secondary | ICD-10-CM

## 2020-05-16 DIAGNOSIS — K2100 Gastro-esophageal reflux disease with esophagitis, without bleeding: Secondary | ICD-10-CM | POA: Diagnosis not present

## 2020-05-16 DIAGNOSIS — Z7984 Long term (current) use of oral hypoglycemic drugs: Secondary | ICD-10-CM | POA: Insufficient documentation

## 2020-05-16 DIAGNOSIS — I10 Essential (primary) hypertension: Secondary | ICD-10-CM | POA: Diagnosis not present

## 2020-05-16 DIAGNOSIS — Z955 Presence of coronary angioplasty implant and graft: Secondary | ICD-10-CM | POA: Insufficient documentation

## 2020-05-16 DIAGNOSIS — Z886 Allergy status to analgesic agent status: Secondary | ICD-10-CM | POA: Insufficient documentation

## 2020-05-16 DIAGNOSIS — I25118 Atherosclerotic heart disease of native coronary artery with other forms of angina pectoris: Secondary | ICD-10-CM

## 2020-05-16 DIAGNOSIS — Z86010 Personal history of colonic polyps: Secondary | ICD-10-CM

## 2020-05-16 DIAGNOSIS — E785 Hyperlipidemia, unspecified: Secondary | ICD-10-CM | POA: Diagnosis not present

## 2020-05-16 DIAGNOSIS — E7800 Pure hypercholesterolemia, unspecified: Secondary | ICD-10-CM | POA: Diagnosis not present

## 2020-05-16 DIAGNOSIS — I2510 Atherosclerotic heart disease of native coronary artery without angina pectoris: Secondary | ICD-10-CM | POA: Diagnosis present

## 2020-05-16 DIAGNOSIS — R1310 Dysphagia, unspecified: Secondary | ICD-10-CM

## 2020-05-16 DIAGNOSIS — Z7982 Long term (current) use of aspirin: Secondary | ICD-10-CM | POA: Insufficient documentation

## 2020-05-16 DIAGNOSIS — F419 Anxiety disorder, unspecified: Secondary | ICD-10-CM | POA: Diagnosis not present

## 2020-05-16 HISTORY — PX: CORONARY STENT INTERVENTION: CATH118234

## 2020-05-16 HISTORY — PX: LEFT HEART CATH AND CORONARY ANGIOGRAPHY: CATH118249

## 2020-05-16 HISTORY — PX: INTRAVASCULAR ULTRASOUND/IVUS: CATH118244

## 2020-05-16 HISTORY — PX: INTRAVASCULAR PRESSURE WIRE/FFR STUDY: CATH118243

## 2020-05-16 LAB — GLUCOSE, CAPILLARY: Glucose-Capillary: 131 mg/dL — ABNORMAL HIGH (ref 70–99)

## 2020-05-16 LAB — POCT ACTIVATED CLOTTING TIME
Activated Clotting Time: 267 seconds
Activated Clotting Time: 333 seconds

## 2020-05-16 SURGERY — LEFT HEART CATH AND CORONARY ANGIOGRAPHY
Anesthesia: LOCAL

## 2020-05-16 MED ORDER — SODIUM CHLORIDE 0.9 % IV SOLN: 500.0000 mL | INTRAVENOUS | Status: DC

## 2020-05-16 MED ORDER — SODIUM CHLORIDE 0.9 % WEIGHT BASED INFUSION: 1.0000 mL/kg/h | INTRAVENOUS | Status: DC

## 2020-05-16 MED ORDER — LIDOCAINE HCL (PF) 1 % IJ SOLN
INTRAMUSCULAR | Status: DC | PRN
  Administered 2020-05-16: 2 mL

## 2020-05-16 MED ORDER — TRAMADOL HCL 50 MG PO TABS: 50.0000 mg | ORAL_TABLET | Freq: Every day | ORAL | Status: DC | PRN

## 2020-05-16 MED ORDER — HYDRALAZINE HCL 20 MG/ML IJ SOLN: 10.0000 mg | INTRAMUSCULAR | Status: DC | PRN

## 2020-05-16 MED ORDER — ATORVASTATIN CALCIUM 40 MG PO TABS: 40.0000 mg | ORAL_TABLET | Freq: Every day | ORAL | Status: DC

## 2020-05-16 MED ORDER — CLOPIDOGREL BISULFATE 300 MG PO TABS
ORAL_TABLET | ORAL | Status: AC
  Filled 2020-05-16: qty 2

## 2020-05-16 MED ORDER — SODIUM CHLORIDE 0.9 % WEIGHT BASED INFUSION
3.0000 mL/kg/h | INTRAVENOUS | Status: AC
  Administered 2020-05-16: 3 mL/kg/h via INTRAVENOUS

## 2020-05-16 MED ORDER — LIDOCAINE HCL (PF) 1 % IJ SOLN
INTRAMUSCULAR | Status: AC
  Filled 2020-05-16: qty 30

## 2020-05-16 MED ORDER — SODIUM CHLORIDE 0.9 % IV SOLN: 250.0000 mL | INTRAVENOUS | Status: DC | PRN

## 2020-05-16 MED ORDER — CO Q 10 100 MG PO CAPS: 100.0000 mg | ORAL_CAPSULE | Freq: Every day | ORAL | Status: DC

## 2020-05-16 MED ORDER — IRBESARTAN 150 MG PO TABS: 150.0000 mg | ORAL_TABLET | Freq: Every day | ORAL | Status: DC

## 2020-05-16 MED ORDER — AMLODIPINE BESYLATE 5 MG PO TABS: 5.0000 mg | ORAL_TABLET | Freq: Every day | ORAL | Status: DC

## 2020-05-16 MED ORDER — VERAPAMIL HCL 2.5 MG/ML IV SOLN
INTRAVENOUS | Status: AC
  Filled 2020-05-16: qty 2

## 2020-05-16 MED ORDER — CYCLOBENZAPRINE HCL 10 MG PO TABS: 10.0000 mg | ORAL_TABLET | Freq: Three times a day (TID) | ORAL | Status: DC | PRN

## 2020-05-16 MED ORDER — NITROGLYCERIN 1 MG/10 ML FOR IR/CATH LAB
INTRA_ARTERIAL | Status: DC | PRN
  Administered 2020-05-16: 400 mL via INTRA_ARTERIAL

## 2020-05-16 MED ORDER — NITROFURANTOIN MACROCRYSTAL 100 MG PO CAPS: 100.0000 mg | ORAL_CAPSULE | Freq: Two times a day (BID) | ORAL | Status: DC

## 2020-05-16 MED ORDER — ASPIRIN 81 MG PO CHEW: 81.0000 mg | CHEWABLE_TABLET | ORAL | Status: DC

## 2020-05-16 MED ORDER — FAMOTIDINE IN NACL 20-0.9 MG/50ML-% IV SOLN
INTRAVENOUS | Status: AC | PRN
  Administered 2020-05-16: 20 mg via INTRAVENOUS

## 2020-05-16 MED ORDER — GABAPENTIN 300 MG PO CAPS: 600.0000 mg | ORAL_CAPSULE | Freq: Every day | ORAL | Status: DC

## 2020-05-16 MED ORDER — SODIUM CHLORIDE 0.9% FLUSH: 3.0000 mL | Freq: Two times a day (BID) | INTRAVENOUS | Status: DC

## 2020-05-16 MED ORDER — HEPARIN SODIUM (PORCINE) 1000 UNIT/ML IJ SOLN
INTRAMUSCULAR | Status: AC
  Filled 2020-05-16: qty 1

## 2020-05-16 MED ORDER — HEPARIN (PORCINE) IN NACL 1000-0.9 UT/500ML-% IV SOLN
INTRAVENOUS | Status: AC
  Filled 2020-05-16: qty 1000

## 2020-05-16 MED ORDER — HEPARIN SODIUM (PORCINE) 1000 UNIT/ML IJ SOLN
INTRAMUSCULAR | Status: DC | PRN
  Administered 2020-05-16: 2000 [IU] via INTRAVENOUS
  Administered 2020-05-16: 5000 [IU] via INTRAVENOUS
  Administered 2020-05-16: 4000 [IU] via INTRAVENOUS
  Administered 2020-05-16: 2000 [IU] via INTRAVENOUS

## 2020-05-16 MED ORDER — NITROGLYCERIN 1 MG/10 ML FOR IR/CATH LAB
INTRA_ARTERIAL | Status: AC
  Filled 2020-05-16: qty 10

## 2020-05-16 MED ORDER — ATORVASTATIN CALCIUM 80 MG PO TABS: 80.0000 mg | ORAL_TABLET | Freq: Every day | ORAL | 3 refills | Status: DC

## 2020-05-16 MED ORDER — ACETAMINOPHEN 325 MG PO TABS
ORAL_TABLET | ORAL | Status: AC
  Filled 2020-05-16: qty 2

## 2020-05-16 MED ORDER — CLOPIDOGREL BISULFATE 75 MG PO TABS: 75.0000 mg | ORAL_TABLET | Freq: Every day | ORAL | Status: DC

## 2020-05-16 MED ORDER — MIDAZOLAM HCL 2 MG/2ML IJ SOLN
INTRAMUSCULAR | Status: AC
  Filled 2020-05-16: qty 2

## 2020-05-16 MED ORDER — IOHEXOL 350 MG/ML SOLN
INTRAVENOUS | Status: DC | PRN
  Administered 2020-05-16: 120 mL

## 2020-05-16 MED ORDER — CLOPIDOGREL BISULFATE 300 MG PO TABS
ORAL_TABLET | ORAL | Status: DC | PRN
  Administered 2020-05-16: 600 mg via ORAL

## 2020-05-16 MED ORDER — PANTOPRAZOLE SODIUM 40 MG PO TBEC: 40.0000 mg | DELAYED_RELEASE_TABLET | Freq: Every day | ORAL | Status: DC | PRN

## 2020-05-16 MED ORDER — ACETAMINOPHEN 500 MG PO TABS: 1000.0000 mg | ORAL_TABLET | Freq: Three times a day (TID) | ORAL | Status: DC | PRN

## 2020-05-16 MED ORDER — ASPIRIN 81 MG PO CHEW: 81.0000 mg | CHEWABLE_TABLET | Freq: Every day | ORAL | Status: DC

## 2020-05-16 MED ORDER — CLOPIDOGREL BISULFATE 75 MG PO TABS: 75.0000 mg | ORAL_TABLET | Freq: Every day | ORAL | 3 refills | Status: DC

## 2020-05-16 MED ORDER — ASPIRIN EC 81 MG PO TBEC: 81.0000 mg | DELAYED_RELEASE_TABLET | Freq: Every day | ORAL | Status: DC

## 2020-05-16 MED ORDER — SODIUM CHLORIDE 0.9 % IV SOLN: INTRAVENOUS | Status: AC

## 2020-05-16 MED ORDER — HEPARIN (PORCINE) IN NACL 1000-0.9 UT/500ML-% IV SOLN
INTRAVENOUS | Status: DC | PRN
  Administered 2020-05-16 (×2): 500 mL

## 2020-05-16 MED ORDER — FINASTERIDE 5 MG PO TABS: 5.0000 mg | ORAL_TABLET | Freq: Every day | ORAL | Status: DC

## 2020-05-16 MED ORDER — SODIUM CHLORIDE 0.9% FLUSH: 3.0000 mL | INTRAVENOUS | Status: DC | PRN

## 2020-05-16 MED ORDER — VERAPAMIL HCL 2.5 MG/ML IV SOLN
INTRAVENOUS | Status: DC | PRN
  Administered 2020-05-16: 10 mL via INTRA_ARTERIAL

## 2020-05-16 MED ORDER — ACETAMINOPHEN 325 MG PO TABS
650.0000 mg | ORAL_TABLET | ORAL | Status: DC | PRN
  Administered 2020-05-16: 650 mg via ORAL

## 2020-05-16 MED ORDER — MIDAZOLAM HCL 2 MG/2ML IJ SOLN
INTRAMUSCULAR | Status: DC | PRN
  Administered 2020-05-16: 2 mg via INTRAVENOUS

## 2020-05-16 MED ORDER — DIPHENHYDRAMINE-APAP (SLEEP) 25-500 MG PO TABS: 1.0000 | ORAL_TABLET | Freq: Every day | ORAL | Status: DC

## 2020-05-16 MED ORDER — FENTANYL CITRATE (PF) 100 MCG/2ML IJ SOLN
INTRAMUSCULAR | Status: DC | PRN
  Administered 2020-05-16: 25 ug via INTRAVENOUS

## 2020-05-16 MED ORDER — ONDANSETRON HCL 4 MG/2ML IJ SOLN: 4.0000 mg | Freq: Four times a day (QID) | INTRAMUSCULAR | Status: DC | PRN

## 2020-05-16 MED ORDER — LABETALOL HCL 5 MG/ML IV SOLN: 10.0000 mg | INTRAVENOUS | Status: DC | PRN

## 2020-05-16 MED ORDER — ISOSORBIDE MONONITRATE ER 30 MG PO TB24: 15.0000 mg | ORAL_TABLET | Freq: Every day | ORAL | Status: DC

## 2020-05-16 MED ORDER — BIOTIN 10000 MCG PO TABS: 1000.0000 mg | ORAL_TABLET | Freq: Every day | ORAL | Status: DC

## 2020-05-16 MED ORDER — NITROGLYCERIN 0.4 MG SL SUBL: 0.4000 mg | SUBLINGUAL_TABLET | SUBLINGUAL | Status: DC | PRN

## 2020-05-16 MED ORDER — FENTANYL CITRATE (PF) 100 MCG/2ML IJ SOLN
INTRAMUSCULAR | Status: AC
  Filled 2020-05-16: qty 2

## 2020-05-16 MED ORDER — TAMSULOSIN HCL 0.4 MG PO CAPS: 0.4000 mg | ORAL_CAPSULE | Freq: Every day | ORAL | Status: DC

## 2020-05-16 SURGICAL SUPPLY — 20 items
BALLN SAPPHIRE 2.0X12 (BALLOONS) ×2
BALLN SAPPHIRE ~~LOC~~ 3.75X12 (BALLOONS) ×1 IMPLANT
BALLOON SAPPHIRE 2.0X12 (BALLOONS) IMPLANT
CATH 5FR JL3.5 JR4 ANG PIG MP (CATHETERS) ×1 IMPLANT
CATH LAUNCHER 6FR EBU3.5 (CATHETERS) ×1 IMPLANT
CATH OPTICROSS HD (CATHETERS) ×1 IMPLANT
DEVICE RAD COMP TR BAND LRG (VASCULAR PRODUCTS) ×1 IMPLANT
GLIDESHEATH SLEND SS 6F .021 (SHEATH) ×1 IMPLANT
GUIDEWIRE INQWIRE 1.5J.035X260 (WIRE) IMPLANT
GUIDEWIRE PRESSURE X 175 (WIRE) ×1 IMPLANT
INQWIRE 1.5J .035X260CM (WIRE) ×2
KIT ENCORE 26 ADVANTAGE (KITS) ×1 IMPLANT
KIT HEART LEFT (KITS) ×2 IMPLANT
PACK CARDIAC CATHETERIZATION (CUSTOM PROCEDURE TRAY) ×2 IMPLANT
SLED PULL BACK IVUS (MISCELLANEOUS) ×1 IMPLANT
STENT RESOLUTE ONYX 3.5X34 (Permanent Stent) ×1 IMPLANT
TRANSDUCER W/STOPCOCK (MISCELLANEOUS) ×2 IMPLANT
TUBING CIL FLEX 10 FLL-RA (TUBING) ×2 IMPLANT
WIRE ASAHI PROWATER 180CM (WIRE) ×1 IMPLANT
WIRE HI TORQ BMW 190CM (WIRE) ×1 IMPLANT

## 2020-05-16 NOTE — Discharge Summary (Addendum)
Discharge Summary    Patient ID: [name] MRN: [id number]; DOB: 09-16-1950  Admit date: 05/16/2020 Discharge date: 05/16/2020  PCP:  Lavone Orn, [place]  Cardiologist:  Candee Furbish, MD   Discharge Diagnoses    Active Problems:   CAD (coronary artery disease)    Diagnostic Studies/Procedures    Left heart cath 05/16/20:  Prox LAD to Mid LAD lesion is 25% stenosed- instent restenosis of the prior stent.  Mid LAD lesion is 75% stenosed. This was significant by RFR, 0.88.  A drug-eluting stent was successfully placed using a STENT RESOLUTE ONYX 3.5X34, postdilated to 3.75 in overlapping the prior stent. This was optimized with intravascular ultrasound.  Post intervention, there is a 0% residual stenosis.  2nd Diag lesion is 75% stenosed.  Balloon angioplasty was performed using a BALLOON SAPPHIRE 2.0X12.  Post intervention, there is a 25% residual stenosis.  The left ventricular systolic function is normal.  LV end diastolic pressure is normal.  There is no aortic valve stenosis.   Continue aggressive secondary prevention.  Plan for clopidogrel and aspirin for at least 6 months.  Consider clopidogrel monotherapy going forward after 6 months.  Diagnostic Dominance: Right    Intervention      _____________   History of Present Illness     [name] is a 70 y.o. male with a history of CAD s/p DES to LAD in 2010, HTN, HLD, DM, and anxiety. Last myoview 09/2018 was low risk. He presented to clinic with progressively worsening exercise intolerance, exertional SOB, and left sided chest pain x 2 weeks. His symptoms were similar to those experienced prior to his PCI in 2010.   Hospital Course     Consultants: none  CAD He presented for scheduled angiography, which revealed 75% stenossi in the mid LAD successfully treated with DES optimized with IVUS. He also had 75% stenosis in D2 treated with balloon  angioplasty. He has residual 25% ISR to prior proximal LAD stent that will be treated medically. He tolerated the procedure well. He was started on ASA and plavix x 6 months, then consider plavix monotherapy. HR has been too low to add BB in the past. Same-day PCI protocol followed. I advised them to continue imdur until follow up with Richardson Dopp PAC.   Hypertension No medication changes   Hyperlipidemia Increased lipitor from 40 mg to 80 mg. Repeat lipid panel in 6 weeks.    DM Will resume metformin in 48 hrs.  I spoke with the patient's wife and they are aware and will hold metformin following heart cath.    Did the patient have an acute coronary syndrome (MI, NSTEMI, STEMI, etc) this admission?:  No                               Did the patient have a percutaneous coronary intervention (stent / angioplasty)?:  Yes.     Cath/PCI Registry Performance & Quality Measures: 1. Aspirin prescribed? - Yes 2. ADP Receptor Inhibitor (Plavix/Clopidogrel, Brilinta/Ticagrelor or Effient/Prasugrel) prescribed (includes medically managed patients)? - Yes 3. High Intensity Statin (Lipitor 40-80mg  or Crestor 20-40mg ) prescribed? - Yes 4. For EF <40%, was ACEI/ARB prescribed? - Yes 5. For EF <40%, Aldosterone Antagonist (Spironolactone or Eplerenone) prescribed? - Not Applicable (EF >/= 40%) 6. Cardiac Rehab Phase II ordered? - Yes       _____________  Discharge Vitals Blood pressure 112/79, pulse Marland Kitchen)  58, temperature 97.7 F (36.5 C), temperature source Oral, resp. rate 18, height 5\' 11"  (1.803 m), weight 80.7 kg, SpO2 98 %.  Filed Weights   05/16/20 0551  Weight: 80.7 kg    Labs & Radiologic Studies    CBC No results for input(s): WBC, NEUTROABS, HGB, HCT, MCV, PLT in the last 72 hours. Basic Metabolic Panel No results for input(s): NA, K, CL, CO2, GLUCOSE, BUN, CREATININE, CALCIUM, MG, PHOS in the last 72 hours. Liver Function Tests No results for input(s): AST, ALT, ALKPHOS,  BILITOT, PROT, ALBUMIN in the last 72 hours. No results for input(s): LIPASE, AMYLASE in the last 72 hours. High Sensitivity Troponin:   No results for input(s): TROPONINIHS in the last 720 hours.  BNP Invalid input(s): POCBNP D-Dimer No results for input(s): DDIMER in the last 72 hours. Hemoglobin A1C No results for input(s): HGBA1C in the last 72 hours. Fasting Lipid Panel No results for input(s): CHOL, HDL, LDLCALC, TRIG, CHOLHDL, LDLDIRECT in the last 72 hours. Thyroid Function Tests No results for input(s): TSH, T4TOTAL, T3FREE, THYROIDAB in the last 72 hours.  Invalid input(s): FREET3 _____________  CARDIAC CATHETERIZATION  Result Date: 05/16/2020  Prox LAD to Mid LAD lesion is 25% stenosed- instent restenosis of the prior stent.  Mid LAD lesion is 75% stenosed. This was significant by RFR, 0.88.  A drug-eluting stent was successfully placed using a STENT RESOLUTE ONYX 3.5X34, postdilated to 3.75 in overlapping the prior stent. This was optimized with intravascular ultrasound.  Post intervention, there is a 0% residual stenosis.  2nd Diag lesion is 75% stenosed.  Balloon angioplasty was performed using a BALLOON SAPPHIRE 2.0X12.  Post intervention, there is a 25% residual stenosis.  The left ventricular systolic function is normal.  LV end diastolic pressure is normal.  There is no aortic valve stenosis.  Continue aggressive secondary prevention.  Plan for clopidogrel and aspirin for at least 6 months.  Consider clopidogrel monotherapy going forward after 6 months. Plan for same-day discharge.   Disposition   Pt is being discharged home today in good condition.  Follow-up Plans & Appointments     Follow-up Information    Liliane Shi, PA-C Follow up on 05/31/2020.   Specialties: Cardiology, Physician Assistant Why: 11:45 for hospital follow up, TOC Contact information: [address] [place] [address] [phone]               Discharge Instructions    Amb Referral to Cardiac Rehabilitation   Complete by: As directed    Referring to [facility] CRP 2  [place]   Diagnosis: Coronary Stents   After initial evaluation and assessments completed: Virtual Based Care may be provided alone or in conjunction with Phase 2 Cardiac Rehab based on patient barriers.: Yes      Discharge Medications   Allergies as of 05/16/2020      Reactions   Chicken Allergy Shortness Of Breath, Nausea And Vomiting, Other (See Comments)   Headache SAME REACTION(S) TO [place]!!!!   Morphine Shortness Of Breath   Other Anaphylaxis   Ate a fruit salad and had to go to the E.R.   Pineapple Anaphylaxis, Rash   Sulfa Antibiotics Shortness Of Breath   Headache SAME REACTION(S) TO [place]!!!!   Amlodipine Besylate    Other reaction(s): dizziness and edema   Chlorthalidone    Other reaction(s): headache   Naproxen Rash   Other reaction(s): pseudoporphyria      Medication List    TAKE  these medications   acetaminophen 500 MG tablet Commonly known as: TYLENOL Take 1,000 mg by mouth every 8 (eight) hours as needed for moderate pain.   amLODipine 5 MG tablet Commonly known as: NORVASC Take 5 mg by mouth daily.   aspirin EC 81 MG tablet Take 81 mg by mouth daily.   atorvastatin 80 MG tablet Commonly known as: LIPITOR Take 1 tablet (80 mg total) by mouth daily. What changed:   medication strength  how much to take   Biotin 10000 MCG Tabs Take 1,000 mg by mouth daily.   clopidogrel 75 MG tablet Commonly known as: PLAVIX Take 1 tablet (75 mg total) by mouth daily with breakfast. Start taking on: May 17, 2020   Co Q 10 100 MG Caps Take 100 mg by mouth daily.   cyclobenzaprine 10 MG tablet Commonly known as: FLEXERIL Take 10 mg by mouth 3 (three) times daily as needed for muscle spasms.   diphenhydramine-acetaminophen 25-500 MG Tabs tablet Commonly known as: TYLENOL PM Take 1 tablet by mouth at bedtime.    finasteride 5 MG tablet Commonly known as: PROSCAR Take 5 mg by mouth daily.   gabapentin 300 MG capsule Commonly known as: NEURONTIN Take 600 mg by mouth at bedtime. And may take an additional 300 mg once a day if needed   isosorbide mononitrate 30 MG 24 hr tablet Commonly known as: IMDUR Take 0.5 tablets (15 mg total) by mouth daily.   metFORMIN 500 MG 24 hr tablet Commonly known as: GLUCOPHAGE-XR Take 500 mg by mouth daily. Resume on 05/19/20   nitrofurantoin 100 MG capsule Commonly known as: MACRODANTIN Take 100 mg by mouth 2 (two) times daily.   nitroGLYCERIN 0.4 MG SL tablet Commonly known as: NITROSTAT Place 1 tablet (0.4 mg total) under the tongue every 5 (five) minutes as needed for chest pain.   pantoprazole 40 MG tablet Commonly known as: PROTONIX Take 1 tablet by mouth once daily What changed:   when to take this  reasons to take this   SAW PALMETTO PO Take 100 mg by mouth 2 (two) times daily.   tamsulosin 0.4 MG Caps capsule Commonly known as: FLOMAX Take 1 capsule by mouth at bedtime   traMADol 50 MG tablet Commonly known as: ULTRAM Take 50 mg by mouth daily as needed (for pain).   valsartan 80 MG tablet Commonly known as: DIOVAN TAKE 1 TABLET BY MOUTH DAILY What changed: how much to take   vitamin B-12 1000 MCG tablet Commonly known as: CYANOCOBALAMIN Take 1,000 mcg by mouth 3 (three) times a week.          Outstanding Labs/Studies     Duration of Discharge Encounter   Greater than 30 minutes including physician time.  Signed,  Doreene Adas  I have examined the patient and reviewed assessment and plan and discussed with patient.  Agree with above as stated.  Right radial site stable.  No hematoma.  I stressed the importance of dual antiplatelet therapy with this lady stent.  He needs aggressive secondary prevention including healthy diet and regular exercise.  Hold Metformin for 48 hours given the recent cardiac cath.  He will  follow-up with Dr. Marlou Porch.  [facility]   Larae Grooms, MD 05/16/2020, 6:25 PM

## 2020-05-16 NOTE — Progress Notes (Addendum)
Ria Comment, FNP in to see pt Wrist board applied to right arm

## 2020-05-16 NOTE — Discharge Instructions (Signed)
Radial Site Care  This sheet gives you information about how to care for yourself after your procedure. Your health care provider may also give you more specific instructions. If you have problems or questions, contact your health care provider. What can I expect after the procedure? After the procedure, it is common to have:  Bruising and tenderness at the catheter insertion area. Follow these instructions at home: Medicines  Take over-the-counter and prescription medicines only as told by your health care provider. Insertion site care 1. Follow instructions from your health care provider about how to take care of your insertion site. Make sure you: ? Wash your hands with soap and water before you remove your bandage (dressing). If soap and water are not available, use hand sanitizer. ? May remove dressing in 24 hours. 2. Check your insertion site every day for signs of infection. Check for: ? Redness, swelling, or pain. ? Fluid or blood. ? Pus or a bad smell. ? Warmth. 3. Do no take baths, swim, or use a hot tub for 5 days. 4. You may shower 24-48 hours after the procedure. ? Remove the dressing and gently wash the site with plain soap and water. ? Pat the area dry with a clean towel. ? Do not rub the site. That could cause bleeding. 5. Do not apply powder or lotion to the site. Activity  1. For 24 hours after the procedure, or as directed by your health care provider: ? Do not flex or bend the affected arm. ? Do not push or pull heavy objects with the affected arm. ? Do not drive yourself home from the hospital or clinic. You may drive 24 hours after the procedure. ? Do not operate machinery or power tools. ? KEEP ARM ELEVATED THE REMAINDER OF THE DAY. 2. Do not push, pull or lift anything that is heavier than 10 lb for 5 days. 3. Ask your health care provider when it is okay to: ? Return to work or school. ? Resume usual physical activities or sports. ? Resume sexual  activity. General instructions  If the catheter site starts to bleed, raise your arm and put firm pressure on the site. If the bleeding does not stop, get help right away. This is a medical emergency.  DRINK PLENTY OF FLUIDS FOR THE NEXT 2-3 DAYS.  No alcohol consumption for 24 hours after receiving sedation.  If you went home on the same day as your procedure, a responsible adult should be with you for the first 24 hours after you arrive home.  Keep all follow-up visits as told by your health care provider. This is important. Contact a health care provider if:  You have a fever.  You have redness, swelling, or yellow drainage around your insertion site. Get help right away if:  You have unusual pain at the radial site.  The catheter insertion area swells very fast.  The insertion area is bleeding, and the bleeding does not stop when you hold steady pressure on the area.  Your arm or hand becomes pale, cool, tingly, or numb. These symptoms may represent a serious problem that is an emergency. Do not wait to see if the symptoms will go away. Get medical help right away. Call your local emergency services (911 in the [place] Do not drive yourself to the hospital. Summary  After the procedure, it is common to have bruising and tenderness at the site.  Follow instructions from your health care provider about how to take care   of your radial site wound. Check the wound every day for signs of infection.  This information is not intended to replace advice given to you by your health care provider. Make sure you discuss any questions you have with your health care provider. Document Revised: 03/12/2017 Document Reviewed: 03/12/2017 Elsevier Patient Education  2020 [employer] 

## 2020-05-16 NOTE — Progress Notes (Signed)
8032-1224 Education completed with pt who voiced understanding. Stressed importance of plavix with stent. Reviewed NTG use, walking for ex, heart healthy and diabetic diets given and discussed CRP 2. Referral letter to be sent to [place] RN BSN 05/16/2020 11:38 AM

## 2020-05-16 NOTE — Interval H&P Note (Signed)
Cath Lab Visit (complete for each Cath Lab visit)  Clinical Evaluation Leading to the Procedure:   ACS: No.  Non-ACS:    Anginal Classification: CCS III  Anti-ischemic medical therapy: Maximal Therapy (2 or more classes of medications)  Non-Invasive Test Results: No non-invasive testing performed  Prior CABG: No previous CABG   Prior moderate mid LAD disease.   History and Physical Interval Note:  05/16/2020 7:32 AM  [name]  has presented today for surgery, with the diagnosis of unstable angina.  The various methods of treatment have been discussed with the patient and family. After consideration of risks, benefits and other options for treatment, the patient has consented to  Procedure(s): LEFT HEART CATH AND CORONARY ANGIOGRAPHY (N/A) as a surgical intervention.  The patient's history has been reviewed, patient examined, no change in status, stable for surgery.  I have reviewed the patient's chart and labs.  Questions were answered to the patient's satisfaction.     Larae Grooms

## 2020-05-16 NOTE — Progress Notes (Signed)
Discharge instructions reviewed with pt and his wife. Both voice understanding. 

## 2020-05-17 ENCOUNTER — Telehealth (HOSPITAL_COMMUNITY): Payer: Self-pay

## 2020-05-17 NOTE — Telephone Encounter (Signed)
Per phase 1, fax cardiac rehab referral to [place] [facility] cardiac rehab.

## 2020-05-25 DIAGNOSIS — I2510 Atherosclerotic heart disease of native coronary artery without angina pectoris: Secondary | ICD-10-CM | POA: Diagnosis not present

## 2020-05-25 DIAGNOSIS — E1169 Type 2 diabetes mellitus with other specified complication: Secondary | ICD-10-CM | POA: Diagnosis not present

## 2020-05-25 DIAGNOSIS — I10 Essential (primary) hypertension: Secondary | ICD-10-CM | POA: Diagnosis not present

## 2020-05-25 DIAGNOSIS — E782 Mixed hyperlipidemia: Secondary | ICD-10-CM | POA: Diagnosis not present

## 2020-05-25 DIAGNOSIS — K219 Gastro-esophageal reflux disease without esophagitis: Secondary | ICD-10-CM | POA: Diagnosis not present

## 2020-05-25 DIAGNOSIS — Z0000 Encounter for general adult medical examination without abnormal findings: Secondary | ICD-10-CM | POA: Diagnosis not present

## 2020-05-25 DIAGNOSIS — Z1389 Encounter for screening for other disorder: Secondary | ICD-10-CM | POA: Diagnosis not present
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
## 2020-05-31 ENCOUNTER — Ambulatory Visit: Payer: Medicare Other | Admitting: Physician Assistant

## 2020-05-31 ENCOUNTER — Encounter: Payer: Self-pay | Admitting: Physician Assistant

## 2020-05-31 ENCOUNTER — Other Ambulatory Visit: Payer: Self-pay

## 2020-05-31 VITALS — BP 124/50 | HR 68 | Ht 70.5 in | Wt 183.0 lb

## 2020-05-31 DIAGNOSIS — I10 Essential (primary) hypertension: Secondary | ICD-10-CM | POA: Diagnosis not present

## 2020-05-31 DIAGNOSIS — I2510 Atherosclerotic heart disease of native coronary artery without angina pectoris: Secondary | ICD-10-CM

## 2020-05-31 DIAGNOSIS — R5383 Other fatigue: Secondary | ICD-10-CM

## 2020-05-31 DIAGNOSIS — E7800 Pure hypercholesterolemia, unspecified: Secondary | ICD-10-CM

## 2020-05-31 NOTE — Patient Instructions (Addendum)
Medication Instructions:  Your physician has recommended you make the following change in your medication:   1.  Discontinue Imdur.  *If you need a refill on your cardiac medications before your next appointment, please call your pharmacy*   Lab Work: Your physician recommends that you have lab work today: BMET/CBC/TSH.  If you have labs (blood work) drawn today and your tests are completely normal, you will receive your results only by: Marland Kitchen MyChart Message (if you have MyChart) OR . A paper copy in the mail If you have any lab test that is abnormal or we need to change your treatment, we will call you to review the results.   Testing/Procedures: -None   Follow-Up: At [facility], you and your health needs are our priority.  As part of our continuing mission to provide you with exceptional heart care, we have created designated Provider Care Teams.  These Care Teams include your primary Cardiologist (physician) and Advanced Practice Providers (APPs -  Physician Assistants and Nurse Practitioners) who all work together to provide you with the care you need, when you need it.  We recommend signing up for the patient portal called "MyChart".  Sign up information is provided on this After Visit Summary.  MyChart is used to connect with patients for Virtual Visits (Telemedicine).  Patients are able to view lab/test results, encounter notes, upcoming appointments, etc.  Non-urgent messages can be sent to your provider as well.   To learn more about what you can do with MyChart, go to [email or web address].    Your next appointment:   3 month(s)  With Dr.Skains on Thursday, July 21 @ 11:40 am.  The format for your next appointment:   In Person  Provider:   Candee Furbish, MD   Other Instructions -None

## 2020-06-01 LAB — BASIC METABOLIC PANEL
BUN/Creatinine Ratio: 20 (ref 10–24)
BUN: 21 mg/dL (ref 8–27)
CO2: 20 mmol/L (ref 20–29)
Calcium: 9.1 mg/dL (ref 8.6–10.2)
Chloride: 100 mmol/L (ref 96–106)
Creatinine, Ser: 1.04 mg/dL (ref 0.76–1.27)
Glucose: 130 mg/dL — ABNORMAL HIGH (ref 65–99)
Potassium: 4.8 mmol/L (ref 3.5–5.2)
Sodium: 139 mmol/L (ref 134–144)
eGFR: 76 mL/min/{1.73_m2} (ref >59)

## 2020-06-01 LAB — CBC
Hematocrit: 42.9 % (ref 37.5–51.0)
Hemoglobin: 14.6 g/dL (ref 13.0–17.7)
MCH: 30.5 pg (ref 26.6–33.0)
MCHC: 34 g/dL (ref 31.5–35.7)
MCV: 90 fL (ref 79–97)
Platelets: 133 10*3/uL — ABNORMAL LOW (ref 150–450)
RBC: 4.79 x10E6/uL (ref 4.14–5.80)
RDW: 13.2 % (ref 11.6–15.4)
WBC: 6.2 10*3/uL (ref 3.4–10.8)

## 2020-06-01 LAB — TSH: TSH: 0.744 u[IU]/mL (ref 0.450–4.500)

## 2020-07-10 DIAGNOSIS — Z9861 Coronary angioplasty status: Secondary | ICD-10-CM | POA: Diagnosis not present

## 2020-07-24 DIAGNOSIS — Z9861 Coronary angioplasty status: Secondary | ICD-10-CM | POA: Diagnosis not present

## 2020-07-26 DIAGNOSIS — Z9861 Coronary angioplasty status: Secondary | ICD-10-CM | POA: Diagnosis not present

## 2020-07-27 DIAGNOSIS — Z9861 Coronary angioplasty status: Secondary | ICD-10-CM | POA: Diagnosis not present

## 2020-07-31 DIAGNOSIS — Z9861 Coronary angioplasty status: Secondary | ICD-10-CM | POA: Diagnosis not present

## 2020-08-02 DIAGNOSIS — Z9861 Coronary angioplasty status: Secondary | ICD-10-CM | POA: Diagnosis not present

## 2020-08-03 DIAGNOSIS — Z9861 Coronary angioplasty status: Secondary | ICD-10-CM | POA: Diagnosis not present

## 2020-08-09 DIAGNOSIS — Z9861 Coronary angioplasty status: Secondary | ICD-10-CM | POA: Diagnosis not present

## 2020-08-15 ENCOUNTER — Other Ambulatory Visit (HOSPITAL_COMMUNITY): Payer: Self-pay

## 2020-08-17 DIAGNOSIS — Z9861 Coronary angioplasty status: Secondary | ICD-10-CM | POA: Diagnosis not present

## 2020-08-23 DIAGNOSIS — Z9861 Coronary angioplasty status: Secondary | ICD-10-CM | POA: Diagnosis not present

## 2020-08-24 DIAGNOSIS — Z9861 Coronary angioplasty status: Secondary | ICD-10-CM | POA: Diagnosis not present

## 2020-08-28 ENCOUNTER — Other Ambulatory Visit: Payer: Self-pay | Admitting: Urology

## 2020-09-06 DIAGNOSIS — Z9861 Coronary angioplasty status: Secondary | ICD-10-CM | POA: Diagnosis not present

## 2020-09-07 ENCOUNTER — Ambulatory Visit: Payer: Medicare Other | Admitting: Cardiology

## 2020-09-07 ENCOUNTER — Other Ambulatory Visit: Payer: Self-pay

## 2020-09-07 ENCOUNTER — Encounter: Payer: Self-pay | Admitting: Cardiology

## 2020-09-07 VITALS — BP 110/50 | HR 56 | Ht 70.5 in | Wt 177.0 lb

## 2020-09-07 DIAGNOSIS — E7800 Pure hypercholesterolemia, unspecified: Secondary | ICD-10-CM

## 2020-09-07 DIAGNOSIS — I2510 Atherosclerotic heart disease of native coronary artery without angina pectoris: Secondary | ICD-10-CM

## 2020-09-07 DIAGNOSIS — Z9861 Coronary angioplasty status: Secondary | ICD-10-CM | POA: Diagnosis not present

## 2020-09-07 DIAGNOSIS — I10 Essential (primary) hypertension: Secondary | ICD-10-CM

## 2020-09-07 NOTE — Patient Instructions (Signed)

## 2020-09-07 NOTE — Progress Notes (Signed)
Cardiology Office Note:    Date:  09/07/2020   ID:  [name], DOB Aug 19, 1950, MRN [id number]  PCP:  Lavone Orn, MD   [facility] Providers Cardiologist:  Candee Furbish, MD     Referring MD: Lavone Orn, MD     History of Present Illness:    [name] is a 70 y.o. male here for follow-up of coronary artery disease status post drug-eluting stent to LAD in 2010 with DES to mid LAD and pullback to D2 in March 2022.  Has hypertension hyperlipidemia diabetes lumbar back disease and anxiety.  Doing well.  Blood pressure was a little bit on the low side last visit, feeling washed out.  We stopped isosorbide and he did better.  No chest pain.  His wife is here today.  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  He walked to the 2 mile track on the inside grass.  He is enjoying cardiac rehab.  Doing well.    Past Medical History:  Diagnosis Date   Anxiety    CAD (coronary artery disease)    s/p Taxus DES to LAD in 2010 // Myoview 09/2018: low risk, normal EF    DM2 (diabetes mellitus, type 2) ([facility])    Hx of seasonal allergies    Hyperlipidemia    Hypertension     Past Surgical History:  Procedure Laterality Date   ANGIOPLASTY  2010   stent   CORONARY STENT INTERVENTION N/A 05/16/2020   Procedure: CORONARY STENT INTERVENTION;  Surgeon: Jettie Booze, MD;  Location: [facility];  Service: Cardiovascular;  Laterality: N/A;   INTRAVASCULAR PRESSURE WIRE/FFR STUDY N/A 05/16/2020   Procedure: INTRAVASCULAR PRESSURE WIRE/FFR STUDY;  Surgeon: Jettie Booze, MD;  Location: [facility];  Service: Cardiovascular;  Laterality: N/A;   INTRAVASCULAR ULTRASOUND/IVUS N/A 05/16/2020   Procedure: Intravascular Ultrasound/IVUS;  Surgeon: Jettie Booze, MD;  Location: [facility];  Service: Cardiovascular;  Laterality: N/A;   LEFT HEART CATH AND CORONARY ANGIOGRAPHY N/A 05/16/2020    Procedure: LEFT HEART CATH AND CORONARY ANGIOGRAPHY;  Surgeon: Jettie Booze, MD;  Location: [facility];  Service: Cardiovascular;  Laterality: N/A;   LUMBAR LAMINECTOMY  1991   L3 and T12    Current Medications: Current Meds  Medication Sig   acetaminophen (TYLENOL) 500 MG tablet Take 1,000 mg by mouth every 8 (eight) hours as needed for moderate pain.    amLODipine (NORVASC) 5 MG tablet Take 5 mg by mouth daily.   aspirin EC 81 MG tablet Take 81 mg by mouth daily.   atorvastatin (LIPITOR) 80 MG tablet TAKE 1 TABLET (80 MG TOTAL) BY MOUTH DAILY.   Biotin 10000 MCG TABS Take 1,000 mg by mouth daily.   clopidogrel (PLAVIX) 75 MG tablet TAKE 1 TABLET (75 MG TOTAL) BY MOUTH DAILY WITH BREAKFAST.   Coenzyme Q10 (CO Q 10) 100 MG CAPS Take 100 mg by mouth daily.   cyclobenzaprine (FLEXERIL) 10 MG tablet Take 10 mg by mouth 3 (three) times daily as needed for muscle spasms.   diphenhydramine-acetaminophen (TYLENOL PM) 25-500 MG TABS tablet Take 1 tablet by mouth at bedtime.   finasteride (PROSCAR) 5 MG tablet Take 5 mg by mouth daily.   fluticasone (FLONASE) 50 MCG/ACT nasal spray Place 1 spray into both nostrils as needed for allergies.   gabapentin (NEURONTIN) 300 MG capsule Take 600 mg by mouth at bedtime. And may  take an additional 300 mg once a day if needed   metFORMIN (GLUCOPHAGE-XR) 500 MG 24 hr tablet Take 500 mg by mouth daily.   nitrofurantoin (MACRODANTIN) 100 MG capsule Take 100 mg by mouth 2 (two) times daily.   nitroGLYCERIN (NITROSTAT) 0.4 MG SL tablet Place 1 tablet (0.4 mg total) under the tongue every 5 (five) minutes as needed for chest pain.   pantoprazole (PROTONIX) 40 MG tablet Take 40 mg by mouth as needed (for acid reflux).   Saw Palmetto, Serenoa repens, (SAW PALMETTO PO) Take 100 mg by mouth 2 (two) times daily.   tamsulosin (FLOMAX) 0.4 MG CAPS capsule Take 1 capsule by mouth at bedtime   traMADol (ULTRAM) 50 MG tablet Take 50 mg by mouth daily as needed  (for pain).    valsartan (DIOVAN) 80 MG tablet TAKE 1 TABLET BY MOUTH DAILY   vitamin B-12 (CYANOCOBALAMIN) 1000 MCG tablet Take 1,000 mcg by mouth 3 (three) times a week.   Current Facility-Administered Medications for the 09/07/20 encounter (Office Visit) with Jerline Pain, MD  Medication   0.9 %  sodium chloride infusion     Allergies:   Chicken allergy, Morphine, Other, Pineapple, Sulfa antibiotics, Amlodipine besylate, Chlorthalidone, and Naproxen   Social History   Socioeconomic History   Marital status: Married    Spouse name: Not on file   Number of children: Not on file   Years of education: Not on file   Highest education level: Not on file  Occupational History   Not on file  Tobacco Use   Smoking status: Never   Smokeless tobacco: Never  Substance and Sexual Activity   Alcohol use: Yes    Comment: beer once or twice a month   Drug use: No   Sexual activity: Not on file  Other Topics Concern   Not on file  Social History Narrative   Not on file   Social Determinants of Health   Financial Resource Strain: Not on file  Food Insecurity: Not on file  Transportation Needs: Not on file  Physical Activity: Not on file  Stress: Not on file  Social Connections: Not on file     Family History: The patient's family history includes Breast cancer in his mother; Lung cancer in his father. There is no history of Colon cancer, Stomach cancer, Esophageal cancer, or Pancreatic cancer.  ROS:   Please see the history of present illness.     All other systems reviewed and are negative.  EKGs/Labs/Other Studies Reviewed:    The following studies were reviewed today:  LEFT HEART CATH  05/16/2020 Narrative  Prox LAD to Mid LAD lesion is 25% stenosed- instent restenosis of the prior stent.  Mid LAD lesion is 75% stenosed. This was significant by RFR, 0.88.  A drug-eluting stent was successfully placed using a STENT RESOLUTE ONYX 3.5X34, postdilated to 3.75 in  overlapping the prior stent. This was optimized with intravascular ultrasound.  Post intervention, there is a 0% residual stenosis.  2nd Diag lesion is 75% stenosed.  Balloon angioplasty was performed using a BALLOON SAPPHIRE 2.0X12.  Post intervention, there is a 25% residual stenosis.  The left ventricular systolic function is normal.  LV end diastolic pressure is normal.  There is no aortic valve stenosis. Continue aggressive secondary prevention.  Plan for clopidogrel and aspirin for at least 6 months.  Consider clopidogrel monotherapy going forward after 6 months. Plan for same-day discharge.    Cardiac catheterization 07/12/2008 LM normal LAD proximal  95, mid 50-60; D1 ostial 50 LCx normal RCA mid 40-50 EF 60 PCI: Taxus DES to the proximal LAD    Recent Labs: 05/31/2020: BUN 21; Creatinine, Ser 1.04; Hemoglobin 14.6; Platelets 133; Potassium 4.8; Sodium 139; TSH 0.744  Recent Lipid Panel    Component Value Date/Time   CHOL 150 10/31/2015 1137   TRIG 201 (H) 10/31/2015 1137   HDL 40 (L) 10/31/2015 1137   CHOLHDL 3.8 10/31/2015 1137   VLDL 40 10/31/2015 1137   LDLCALC 70 10/31/2015 1137     Risk Assessment/Calculations:          Physical Exam:    VS:  BP (!) 110/50 (BP Location: Left Arm, Patient Position: Sitting, Cuff Size: Normal)   Pulse (!) 56   Ht 5' 10.5" (1.791 m)   Wt 177 lb (80.3 kg)   SpO2 96%   BMI 25.04 kg/m     Wt Readings from Last 3 Encounters:  09/07/20 177 lb (80.3 kg)  05/31/20 183 lb (83 kg)  05/16/20 178 lb (80.7 kg)     GEN:  Well nourished, well developed in no acute distress HEENT: Normal NECK: No JVD; No carotid bruits LYMPHATICS: No lymphadenopathy CARDIAC: RRR, no murmurs, rubs, gallops RESPIRATORY:  Clear to auscultation without rales, wheezing or rhonchi  ABDOMEN: Soft, non-tender, non-distended MUSCULOSKELETAL:  No edema; No deformity  SKIN: Warm and dry NEUROLOGIC:  Alert and oriented x 3 PSYCHIATRIC:  Normal  affect   ASSESSMENT:    1. Coronary artery disease involving native coronary artery of native heart without angina pectoris   2. Essential hypertension   3. Pure hypercholesterolemia    PLAN:    In order of problems listed above:  Coronary artery disease - LAD DES in 2010, also LAD mid DES and D2 probe and March 2022.  Continuing with clopidogrel and aspirin up until 10/2020.  Likely clopidogrel monotherapy after that. - Previous clinic visit he was having some fatigue and imbalance.  Maybe this was isosorbide.  Diastolic pressures were little bit low on the low side previously.  I stopped his isosorbide.  Remaining on aspirin Plavix amlodipine and atorvastatin.  Essential hypertension - 110/50 today  130/60 at home  Hyperlipidemia - LDL 56 atorvastatin 80 mg a day.  Excellent.  6 mth follow up.       Medication Adjustments/Labs and Tests Ordered: Current medicines are reviewed at length with the patient today.  Concerns regarding medicines are outlined above.  No orders of the defined types were placed in this encounter.  No orders of the defined types were placed in this encounter.   Patient Instructions  Medication Instructions:  The current medical regimen is effective;  continue present plan and medications.  *If you need a refill on your cardiac medications before your next appointment, please call your pharmacy*  Follow-Up: At [facility], you and your health needs are our priority.  As part of our continuing mission to provide you with exceptional heart care, we have created designated Provider Care Teams.  These Care Teams include your primary Cardiologist (physician) and Advanced Practice Providers (APPs -  Physician Assistants and Nurse Practitioners) who all work together to provide you with the care you need, when you need it.  We recommend signing up for the patient portal called "MyChart".  Sign up information is provided on this After Visit Summary.   MyChart is used to connect with patients for Virtual Visits (Telemedicine).  Patients are able to view lab/test results, encounter notes,  upcoming appointments, etc.  Non-urgent messages can be sent to your provider as well.   To learn more about what you can do with MyChart, go to [email or web address].    Your next appointment:   6 month(s)  The format for your next appointment:   In Person  Provider:   Candee Furbish, MD   Thank you for choosing [facility]!!     Signed, Candee Furbish, MD  09/07/2020 12:28 PM    [place]

## 2020-09-11 DIAGNOSIS — Z9861 Coronary angioplasty status: Secondary | ICD-10-CM | POA: Diagnosis not present

## 2020-09-13 DIAGNOSIS — Z9861 Coronary angioplasty status: Secondary | ICD-10-CM | POA: Diagnosis not present

## 2020-09-14 DIAGNOSIS — Z9861 Coronary angioplasty status: Secondary | ICD-10-CM | POA: Diagnosis not present

## 2020-09-20 ENCOUNTER — Other Ambulatory Visit: Payer: Self-pay

## 2020-09-20 ENCOUNTER — Encounter: Payer: Self-pay | Admitting: Urology

## 2020-09-20 ENCOUNTER — Ambulatory Visit: Payer: Medicare Other | Admitting: Urology

## 2020-09-20 VITALS — BP 115/50 | HR 48

## 2020-09-20 DIAGNOSIS — N401 Enlarged prostate with lower urinary tract symptoms: Secondary | ICD-10-CM

## 2020-09-20 DIAGNOSIS — N138 Other obstructive and reflux uropathy: Secondary | ICD-10-CM | POA: Diagnosis not present

## 2020-09-20 DIAGNOSIS — R3912 Poor urinary stream: Secondary | ICD-10-CM

## 2020-09-20 DIAGNOSIS — R319 Hematuria, unspecified: Secondary | ICD-10-CM

## 2020-09-20 LAB — URINALYSIS, ROUTINE W REFLEX MICROSCOPIC
Bilirubin, UA: NEGATIVE
Ketones, UA: NEGATIVE
Nitrite, UA: NEGATIVE
Protein,UA: NEGATIVE
RBC, UA: NEGATIVE
Specific Gravity, UA: 1.015 (ref 1.005–1.030)
Urobilinogen, Ur: 0.2 mg/dL (ref 0.2–1.0)
pH, UA: 5.5 (ref 5.0–7.5)

## 2020-09-20 LAB — MICROSCOPIC EXAMINATION
RBC, Urine: NONE SEEN /hpf (ref 0–2)
Renal Epithel, UA: NONE SEEN /hpf

## 2020-09-20 MED ORDER — TAMSULOSIN HCL 0.4 MG PO CAPS: 0.4000 mg | ORAL_CAPSULE | Freq: Every day | ORAL | 3 refills | Status: DC

## 2020-09-20 MED ORDER — NITROFURANTOIN MACROCRYSTAL 100 MG PO CAPS: 100.0000 mg | ORAL_CAPSULE | Freq: Two times a day (BID) | ORAL | 5 refills | Status: DC

## 2020-09-20 MED ORDER — FINASTERIDE 5 MG PO TABS: 5.0000 mg | ORAL_TABLET | Freq: Every day | ORAL | 3 refills | Status: DC

## 2020-09-20 NOTE — Progress Notes (Signed)
09/20/2020 3:49 PM   [name] [birth year] [id number]  Referring provider: Lavone Orn, MD [place] [address] [place],  [place] [address]  Followup BPH   HPI: [name] is a 70yo here for followup for BPH, acute cystitis, hematuria. He has self treated twice in 6 months for a UTI with macrobid. He has mild LUTS on flomax and finasteride. IPSS 5 QOL 1.No recent gross hematuria. No other complaints   PMH: Past Medical History:  Diagnosis Date   Anxiety    CAD (coronary artery disease)    s/p Taxus DES to LAD in 2010 // Myoview 09/2018: low risk, normal EF    DM2 (diabetes mellitus, type 2) (HCC)    Hx of seasonal allergies    Hyperlipidemia    Hypertension     Surgical History: Past Surgical History:  Procedure Laterality Date   ANGIOPLASTY  2010   stent   CORONARY STENT INTERVENTION N/A 05/16/2020   Procedure: CORONARY STENT INTERVENTION;  Surgeon: Jettie Booze, MD;  Location: [facility];  Service: Cardiovascular;  Laterality: N/A;   INTRAVASCULAR PRESSURE WIRE/FFR STUDY N/A 05/16/2020   Procedure: INTRAVASCULAR PRESSURE WIRE/FFR STUDY;  Surgeon: Jettie Booze, MD;  Location: [facility];  Service: Cardiovascular;  Laterality: N/A;   INTRAVASCULAR ULTRASOUND/IVUS N/A 05/16/2020   Procedure: Intravascular Ultrasound/IVUS;  Surgeon: Jettie Booze, MD;  Location: [facility];  Service: Cardiovascular;  Laterality: N/A;   LEFT HEART CATH AND CORONARY ANGIOGRAPHY N/A 05/16/2020   Procedure: LEFT HEART CATH AND CORONARY ANGIOGRAPHY;  Surgeon: Jettie Booze, MD;  Location: [facility];  Service: Cardiovascular;  Laterality: N/A;   LUMBAR LAMINECTOMY  1991   L3 and T12    Home Medications:  Allergies as of 09/20/2020       Reactions   Chicken Allergy Shortness Of Breath, Nausea And Vomiting, Other (See Comments)   Headache SAME REACTION(S) TO [place]!!!!   Morphine Shortness Of Breath   Other  Anaphylaxis   Ate a fruit salad and had to go to the E.R.   Pineapple Anaphylaxis, Rash   Sulfa Antibiotics Shortness Of Breath   Headache SAME REACTION(S) TO [place]!!!!   Amlodipine Besylate    Other reaction(s): dizziness and edema   Chlorthalidone    Other reaction(s): headache   Naproxen Rash   Other reaction(s): pseudoporphyria        Medication List        Accurate as of September 20, 2020  3:49 PM. If you have any questions, ask your nurse or doctor.          acetaminophen 500 MG tablet Commonly known as: TYLENOL Take 1,000 mg by mouth every 8 (eight) hours as needed for moderate pain.   amLODipine 5 MG tablet Commonly known as: NORVASC Take 5 mg by mouth daily.   aspirin EC 81 MG tablet Take 81 mg by mouth daily.   atorvastatin 80 MG tablet Commonly known as: LIPITOR TAKE 1 TABLET (80 MG TOTAL) BY MOUTH DAILY.   Biotin 10000 MCG Tabs Take 1,000 mg by mouth daily.   clopidogrel 75 MG tablet Commonly known as: PLAVIX TAKE 1 TABLET (75 MG TOTAL) BY MOUTH DAILY WITH BREAKFAST.   Co Q 10 100 MG Caps Take 100 mg by mouth daily.   cyclobenzaprine 10 MG tablet Commonly known as: FLEXERIL Take 10 mg by mouth 3 (three) times daily as needed for muscle spasms.   diphenhydramine-acetaminophen 25-500 MG Tabs  tablet Commonly known as: TYLENOL PM Take 1 tablet by mouth at bedtime.   finasteride 5 MG tablet Commonly known as: PROSCAR Take 5 mg by mouth daily.   fluticasone 50 MCG/ACT nasal spray Commonly known as: FLONASE Place 1 spray into both nostrils as needed for allergies.   gabapentin 300 MG capsule Commonly known as: NEURONTIN Take 600 mg by mouth at bedtime. And may take an additional 300 mg once a day if needed   metFORMIN 500 MG 24 hr tablet Commonly known as: GLUCOPHAGE-XR Take 500 mg by mouth daily.   nitrofurantoin 100 MG capsule Commonly known as: MACRODANTIN Take 100 mg by mouth 2 (two) times daily.   nitroGLYCERIN 0.4 MG SL  tablet Commonly known as: NITROSTAT Place 1 tablet (0.4 mg total) under the tongue every 5 (five) minutes as needed for chest pain.   pantoprazole 40 MG tablet Commonly known as: PROTONIX Take 40 mg by mouth as needed (for acid reflux).   SAW PALMETTO PO Take 100 mg by mouth 2 (two) times daily.   tamsulosin 0.4 MG Caps capsule Commonly known as: FLOMAX Take 1 capsule by mouth at bedtime   traMADol 50 MG tablet Commonly known as: ULTRAM Take 50 mg by mouth daily as needed (for pain).   valsartan 80 MG tablet Commonly known as: DIOVAN TAKE 1 TABLET BY MOUTH DAILY   vitamin B-12 1000 MCG tablet Commonly known as: CYANOCOBALAMIN Take 1,000 mcg by mouth 3 (three) times a week.        Allergies:  Allergies  Allergen Reactions   Chicken Allergy Shortness Of Breath, Nausea And Vomiting and Other (See Comments)    Headache SAME REACTION(S) TO [place]!!!!   Morphine Shortness Of Breath   Other Anaphylaxis    Ate a fruit salad and had to go to the E.R.   Pineapple Anaphylaxis and Rash   Sulfa Antibiotics Shortness Of Breath    Headache SAME REACTION(S) TO [place]!!!!   Amlodipine Besylate     Other reaction(s): dizziness and edema   Chlorthalidone     Other reaction(s): headache   Naproxen Rash    Other reaction(s): pseudoporphyria    Family History: Family History  Problem Relation Age of Onset   Breast cancer Mother    Lung cancer Father    Colon cancer Neg Hx    Stomach cancer Neg Hx    Esophageal cancer Neg Hx    Pancreatic cancer Neg Hx     Social History:  reports that he has never smoked. He has never used smokeless tobacco. He reports current alcohol use. He reports that he does not use drugs.  ROS: All other review of systems were reviewed and are negative except what is noted above in HPI  Physical Exam: BP (!) 115/50   Pulse (!) 48   Constitutional:  Alert and oriented, No acute distress. HEENT: [place] AT, moist mucus membranes.  Trachea midline, no  masses. Cardiovascular: No clubbing, cyanosis, or edema. Respiratory: Normal respiratory effort, no increased work of breathing. GI: Abdomen is soft, nontender, nondistended, no abdominal masses GU: No CVA tenderness.  Lymph: No cervical or inguinal lymphadenopathy. Skin: No rashes, bruises or suspicious lesions. Neurologic: Grossly intact, no focal deficits, moving all 4 extremities. Psychiatric: Normal mood and affect.  Laboratory Data: Lab Results  Component Value Date   WBC 6.2 05/31/2020   HGB 14.6 05/31/2020   HCT 42.9 05/31/2020   MCV 90 05/31/2020   PLT 133 (L) 05/31/2020    Lab  Results  Component Value Date   CREATININE 1.04 05/31/2020    No results found for: PSA  No results found for: TESTOSTERONE  No results found for: HGBA1C  Urinalysis    Component Value Date/Time   COLORURINE YELLOW 06/27/2018 1120   APPEARANCEUR Cloudy (A) 03/23/2020 1438   LABSPEC 1.011 06/27/2018 1120   PHURINE 5.0 06/27/2018 1120   GLUCOSEU Trace (A) 03/23/2020 1438   HGBUR NEGATIVE 06/27/2018 1120   BILIRUBINUR Negative 03/23/2020 1438   KETONESUR NEGATIVE 06/27/2018 1120   PROTEINUR Negative 03/23/2020 1438   PROTEINUR NEGATIVE 06/27/2018 1120   NITRITE Negative 03/23/2020 1438   NITRITE NEGATIVE 06/27/2018 1120   LEUKOCYTESUR 2+ (A) 03/23/2020 1438   LEUKOCYTESUR NEGATIVE 06/27/2018 1120    Lab Results  Component Value Date   LABMICR See below: 03/23/2020   WBCUA 11-30 (A) 03/23/2020   LABEPIT 0-10 03/23/2020   BACTERIA Many (A) 03/23/2020    Pertinent Imaging:  No results found for this or any previous visit.  No results found for this or any previous visit.  No results found for this or any previous visit.  No results found for this or any previous visit.  Results for orders placed during the hospital encounter of 01/30/12  US Renal  Narrative *RADIOLOGY REPORT*  Clinical Data:  Lower back and abdominal pain.  Pyelonephritis. History of kidney  stones.  RENAL/URINARY TRACT ULTRASOUND COMPLETE  Comparison:  None.  Findings:  Right Kidney:  Right kidney is normal in size and echogenicity with no mass or hydronephrosis.  The right kidney measures 11.6 cm.  Left Kidney:  Normal in size and parenchymal echogenicity.  No evidence of mass or hydronephrosis.  The left kidney measures 10.9 cm.  Bladder:  Appears normal for degree of bladder distention.  IMPRESSION: Normal study.   Original Report Authenticated By: Lajean Manes, M.D.  No results found for this or any previous visit.  No results found for this or any previous visit.  No results found for this or any previous visit.   Assessment & Plan:    1. Hematuria, unspecified type -Resolved -Continue finasteride '5mg'$  daily - Urinalysis, Routine w reflex microscopic - Urine Culture  2. Benign prostatic hyperplasia with urinary obstruction -Continue flomax 0.'4mg'$  daily  3. Weak urinary stream -Continue flomax 0.'4mg'$  daily     No follow-ups on file.  Nicolette Bang, MD  [facility] [place]

## 2020-09-20 NOTE — Patient Instructions (Signed)
Benign Prostatic Hyperplasia  Benign prostatic hyperplasia (BPH) is an enlarged prostate gland that is caused by the normal aging process and not by cancer. The prostate is a walnut-sized gland that is involved in the production of semen. It is located in front of the rectum and below the bladder. The bladder stores urine and the urethra is the tube that carries the urine out of the body. The prostate may get bigger asa man gets older. An enlarged prostate can press on the urethra. This can make it harder to pass urine. The build-up of urine in the bladder can cause infection. Back pressure and infection may progress to bladder damage and kidney (renal) failure. What are the causes? This condition is part of a normal aging process. However, not all men develop problems from this condition. If the prostate enlarges away from the urethra, urine flow will not be blocked. If it enlarges toward the urethra andcompresses it, there will be problems passing urine. What increases the risk? This condition is more likely to develop in men over the age of 50 years. What are the signs or symptoms? Symptoms of this condition include: Getting up often during the night to urinate. Needing to urinate frequently during the day. Difficulty starting urine flow. Decrease in size and strength of your urine stream. Leaking (dribbling) after urinating. Inability to pass urine. This needs immediate treatment. Inability to completely empty your bladder. Pain when you pass urine. This is more common if there is also an infection. Urinary tract infection (UTI). How is this diagnosed? This condition is diagnosed based on your medical history, a physical exam, and your symptoms. Tests will also be done, such as: A post-void bladder scan. This measures any amount of urine that may remain in your bladder after you finish urinating. A digital rectal exam. In a rectal exam, your health care provider checks your prostate by  putting a lubricated, gloved finger into your rectum to feel the back of your prostate gland. This exam detects the size of your gland and any abnormal lumps or growths. An exam of your urine (urinalysis). A prostate specific antigen (PSA) screening. This is a blood test used to screen for prostate cancer. An ultrasound. This test uses sound waves to electronically produce a picture of your prostate gland. Your health care provider may refer you to a specialist in kidney and prostate diseases (urologist). How is this treated? Once symptoms begin, your health care provider will monitor your condition (active surveillance or watchful waiting). Treatment for this condition will depend on the severity of your condition. Treatment may include: Observation and yearly exams. This may be the only treatment needed if your condition and symptoms are mild. Medicines to relieve your symptoms, including: Medicines to shrink the prostate. Medicines to relax the muscle of the prostate. Surgery in severe cases. Surgery may include: Prostatectomy. In this procedure, the prostate tissue is removed completely through an open incision or with a laparoscope or robotics. Transurethral resection of the prostate (TURP). In this procedure, a tool is inserted through the opening at the tip of the penis (urethra). It is used to cut away tissue of the inner core of the prostate. The pieces are removed through the same opening of the penis. This removes the blockage. Transurethral incision (TUIP). In this procedure, small cuts are made in the prostate. This lessens the prostate's pressure on the urethra. Transurethral microwave thermotherapy (TUMT). This procedure uses microwaves to create heat. The heat destroys and removes a small   amount of prostate tissue. Transurethral needle ablation (TUNA). This procedure uses radio frequencies to destroy and remove a small amount of prostate tissue. Interstitial laser coagulation (ILC).  This procedure uses a laser to destroy and remove a small amount of prostate tissue. Transurethral electrovaporization (TUVP). This procedure uses electrodes to destroy and remove a small amount of prostate tissue. Prostatic urethral lift. This procedure inserts an implant to push the lobes of the prostate away from the urethra. Follow these instructions at home: Take over-the-counter and prescription medicines only as told by your health care provider. Monitor your symptoms for any changes. Contact your health care provider with any changes. Avoid drinking large amounts of liquid before going to bed or out in public. Avoid or reduce how much caffeine or alcohol you drink. Give yourself time when you urinate. Keep all follow-up visits as told by your health care provider. This is important. Contact a health care provider if: You have unexplained back pain. Your symptoms do not get better with treatment. You develop side effects from the medicine you are taking. Your urine becomes very dark or has a bad smell. Your lower abdomen becomes distended and you have trouble passing your urine. Get help right away if: You have a fever or chills. You suddenly cannot urinate. You feel lightheaded, or very dizzy, or you faint. There are large amounts of blood or clots in the urine. Your urinary problems become hard to manage. You develop moderate to severe low back or flank pain. The flank is the side of your body between the ribs and the hip. These symptoms may represent a serious problem that is an emergency. Do not wait to see if the symptoms will go away. Get medical help right away. Call your local emergency services (911 in the [place] Do not drive yourself to the hospital. Summary Benign prostatic hyperplasia (BPH) is an enlarged prostate that is caused by the normal aging process and not by cancer. An enlarged prostate can press on the urethra. This can make it hard to pass urine. This  condition is part of a normal aging process and is more likely to develop in men over the age of 50 years. Get help right away if you suddenly cannot urinate. This information is not intended to replace advice given to you by your health care provider. Make sure you discuss any questions you have with your healthcare provider. Document Revised: 10/14/2019 Document Reviewed: 10/14/2019 Elsevier Patient Education  2022 [employer]  

## 2020-09-20 NOTE — Progress Notes (Signed)
Urological Symptom Review  Patient is experiencing the following symptoms: Erection problems (male only)   Review of Systems  Gastrointestinal (upper)  : Negative for upper GI symptoms  Gastrointestinal (lower) : Negative for lower GI symptoms  Constitutional : Negative for symptoms  Skin: Negative for skin symptoms  Eyes: Negative for eye symptoms  Ear/Nose/Throat : Negative for Ear/Nose/Throat symptoms  Hematologic/Lymphatic: Negative for Hematologic/Lymphatic symptoms  Cardiovascular : Negative for cardiovascular symptoms  Respiratory : Negative for respiratory symptoms  Endocrine: Negative for endocrine symptoms  Musculoskeletal: Back pain  Neurological: Negative for neurological symptoms  Psychologic: Negative for psychiatric symptoms

## 2020-09-23 LAB — URINE CULTURE

## 2020-09-25 ENCOUNTER — Telehealth: Payer: Self-pay

## 2020-09-25 NOTE — Telephone Encounter (Signed)
-----   Message from Cleon Gustin, MD sent at 09/25/2020 10:14 AM EDT ----- Continue macrobid ----- Message ----- From: Dorisann Frames, RN Sent: 09/25/2020   8:53 AM EDT To: Cleon Gustin, MD  Placed on macrobid on 08/03

## 2020-09-25 NOTE — Telephone Encounter (Signed)
Called patient- no answer. Left message to finish macrobid

## 2020-09-27 DIAGNOSIS — Z9861 Coronary angioplasty status: Secondary | ICD-10-CM | POA: Diagnosis not present

## 2020-09-28 DIAGNOSIS — Z9861 Coronary angioplasty status: Secondary | ICD-10-CM | POA: Diagnosis not present

## 2020-10-02 DIAGNOSIS — Z9861 Coronary angioplasty status: Secondary | ICD-10-CM | POA: Diagnosis not present

## 2020-10-04 DIAGNOSIS — Z9861 Coronary angioplasty status: Secondary | ICD-10-CM | POA: Diagnosis not present

## 2020-10-05 DIAGNOSIS — Z9861 Coronary angioplasty status: Secondary | ICD-10-CM | POA: Diagnosis not present

## 2020-10-09 DIAGNOSIS — Z9861 Coronary angioplasty status: Secondary | ICD-10-CM | POA: Diagnosis not present

## 2020-10-11 DIAGNOSIS — Z9861 Coronary angioplasty status: Secondary | ICD-10-CM | POA: Diagnosis not present

## 2020-10-18 DIAGNOSIS — Z9861 Coronary angioplasty status: Secondary | ICD-10-CM | POA: Diagnosis not present

## 2020-10-19 DIAGNOSIS — Z9861 Coronary angioplasty status: Secondary | ICD-10-CM | POA: Diagnosis not present

## 2020-10-25 DIAGNOSIS — Z9861 Coronary angioplasty status: Secondary | ICD-10-CM | POA: Diagnosis not present

## 2020-10-30 DIAGNOSIS — Z9861 Coronary angioplasty status: Secondary | ICD-10-CM | POA: Diagnosis not present

## 2020-11-01 DIAGNOSIS — Z9861 Coronary angioplasty status: Secondary | ICD-10-CM | POA: Diagnosis not present

## 2020-11-02 DIAGNOSIS — Z9861 Coronary angioplasty status: Secondary | ICD-10-CM | POA: Diagnosis not present

## 2020-11-06 DIAGNOSIS — Z9861 Coronary angioplasty status: Secondary | ICD-10-CM | POA: Diagnosis not present

## 2020-11-08 DIAGNOSIS — Z9861 Coronary angioplasty status: Secondary | ICD-10-CM | POA: Diagnosis not present

## 2020-11-09 DIAGNOSIS — Z9861 Coronary angioplasty status: Secondary | ICD-10-CM | POA: Diagnosis not present

## 2020-11-13 DIAGNOSIS — Z9861 Coronary angioplasty status: Secondary | ICD-10-CM | POA: Diagnosis not present

## 2020-11-27 DIAGNOSIS — Z9861 Coronary angioplasty status: Secondary | ICD-10-CM | POA: Diagnosis not present

## 2020-11-28 DIAGNOSIS — M720 Palmar fascial fibromatosis [Dupuytren]: Secondary | ICD-10-CM | POA: Diagnosis not present

## 2020-11-29 DIAGNOSIS — Z9861 Coronary angioplasty status: Secondary | ICD-10-CM | POA: Diagnosis not present

## 2020-12-04 DIAGNOSIS — Z9861 Coronary angioplasty status: Secondary | ICD-10-CM | POA: Diagnosis not present

## 2020-12-08 DIAGNOSIS — M62838 Other muscle spasm: Secondary | ICD-10-CM | POA: Diagnosis not present

## 2020-12-08 DIAGNOSIS — T300 Burn of unspecified body region, unspecified degree: Secondary | ICD-10-CM | POA: Diagnosis not present

## 2021-01-18 DIAGNOSIS — L814 Other melanin hyperpigmentation: Secondary | ICD-10-CM | POA: Diagnosis not present

## 2021-01-18 DIAGNOSIS — L821 Other seborrheic keratosis: Secondary | ICD-10-CM | POA: Diagnosis not present

## 2021-01-18 DIAGNOSIS — Z85828 Personal history of other malignant neoplasm of skin: Secondary | ICD-10-CM | POA: Diagnosis not present

## 2021-01-18 DIAGNOSIS — L570 Actinic keratosis: Secondary | ICD-10-CM | POA: Diagnosis not present

## 2021-01-23 DIAGNOSIS — H26493 Other secondary cataract, bilateral: Secondary | ICD-10-CM | POA: Diagnosis not present

## 2021-01-29 DIAGNOSIS — H26491 Other secondary cataract, right eye: Secondary | ICD-10-CM | POA: Diagnosis not present

## 2021-02-23 DIAGNOSIS — M720 Palmar fascial fibromatosis [Dupuytren]: Secondary | ICD-10-CM | POA: Diagnosis not present

## 2021-03-02 DIAGNOSIS — M25641 Stiffness of right hand, not elsewhere classified: Secondary | ICD-10-CM | POA: Diagnosis not present

## 2021-03-14 DIAGNOSIS — I10 Essential (primary) hypertension: Secondary | ICD-10-CM | POA: Diagnosis not present

## 2021-04-23 ENCOUNTER — Other Ambulatory Visit: Payer: Self-pay | Admitting: Urology

## 2021-04-23 DIAGNOSIS — R319 Hematuria, unspecified: Secondary | ICD-10-CM

## 2021-05-02 ENCOUNTER — Telehealth: Payer: Self-pay

## 2021-05-02 NOTE — Telephone Encounter (Signed)
Patient was told to call Dr's office to let us know he needs a refill on: ? ? ?nitrofurantoin, macrocrystal-monohydrate, (MACROBID) 100 MG capsule ? ?Pharmacy: ?[place], [place] - [address] [place] Phone:  [phone]  ?Fax:  [phone]  ?  ?Please call pt back at:  [phone]  ? ?Thanks, ?Helene Kelp ?

## 2021-05-03 NOTE — Telephone Encounter (Signed)
Left message for patient to return call to office to schedule visit with PA due to request for antibiotic.  ?

## 2021-05-14 ENCOUNTER — Other Ambulatory Visit: Payer: Self-pay

## 2021-05-14 ENCOUNTER — Ambulatory Visit
Admission: RE | Admit: 2021-05-14 | Discharge: 2021-05-14 | Disposition: A | Discharge status: Home / Self Care | Payer: Medicare Other | Source: Ambulatory Visit | Attending: Physician Assistant | Admitting: Physician Assistant

## 2021-05-14 ENCOUNTER — Other Ambulatory Visit: Payer: Self-pay | Admitting: Physician Assistant

## 2021-05-14 DIAGNOSIS — R0989 Other specified symptoms and signs involving the circulatory and respiratory systems: Secondary | ICD-10-CM

## 2021-05-14 DIAGNOSIS — R051 Acute cough: Secondary | ICD-10-CM | POA: Diagnosis not present

## 2021-05-14 DIAGNOSIS — K1379 Other lesions of oral mucosa: Secondary | ICD-10-CM | POA: Diagnosis not present

## 2021-05-14 DIAGNOSIS — Z03818 Encounter for observation for suspected exposure to other biological agents ruled out: Secondary | ICD-10-CM | POA: Diagnosis not present

## 2021-05-14 DIAGNOSIS — N200 Calculus of kidney: Secondary | ICD-10-CM

## 2021-05-14 DIAGNOSIS — R059 Cough, unspecified: Secondary | ICD-10-CM | POA: Diagnosis not present

## 2021-05-14 DIAGNOSIS — R232 Flushing: Secondary | ICD-10-CM | POA: Diagnosis not present

## 2021-05-14 DIAGNOSIS — R6883 Chills (without fever): Secondary | ICD-10-CM | POA: Diagnosis not present

## 2021-05-15 ENCOUNTER — Ambulatory Visit: Payer: Medicare Other | Admitting: Urology

## 2021-05-15 ENCOUNTER — Ambulatory Visit (HOSPITAL_COMMUNITY)
Admission: RE | Admit: 2021-05-15 | Discharge: 2021-05-15 | Disposition: A | Discharge status: Home / Self Care | Payer: Medicare Other | Source: Ambulatory Visit | Attending: Urology | Admitting: Urology

## 2021-05-15 ENCOUNTER — Other Ambulatory Visit: Payer: Self-pay

## 2021-05-15 ENCOUNTER — Encounter: Payer: Self-pay | Admitting: Urology

## 2021-05-15 VITALS — BP 162/74 | HR 61

## 2021-05-15 DIAGNOSIS — N138 Other obstructive and reflux uropathy: Secondary | ICD-10-CM | POA: Diagnosis not present

## 2021-05-15 DIAGNOSIS — Z9889 Other specified postprocedural states: Secondary | ICD-10-CM | POA: Diagnosis not present

## 2021-05-15 DIAGNOSIS — Z87442 Personal history of urinary calculi: Secondary | ICD-10-CM | POA: Diagnosis not present

## 2021-05-15 DIAGNOSIS — R319 Hematuria, unspecified: Secondary | ICD-10-CM | POA: Diagnosis not present

## 2021-05-15 DIAGNOSIS — N200 Calculus of kidney: Secondary | ICD-10-CM | POA: Insufficient documentation

## 2021-05-15 DIAGNOSIS — N401 Enlarged prostate with lower urinary tract symptoms: Secondary | ICD-10-CM

## 2021-05-15 DIAGNOSIS — R3912 Poor urinary stream: Secondary | ICD-10-CM | POA: Diagnosis not present

## 2021-05-15 MED ORDER — CYCLOBENZAPRINE HCL 10 MG PO TABS: 10.0000 mg | ORAL_TABLET | Freq: Three times a day (TID) | ORAL | 1 refills | Status: DC | PRN

## 2021-05-15 MED ORDER — DOXYCYCLINE HYCLATE 100 MG PO CAPS: 100.0000 mg | ORAL_CAPSULE | Freq: Two times a day (BID) | ORAL | 6 refills | Status: DC

## 2021-05-15 MED ORDER — HYDROCODONE-ACETAMINOPHEN 5-325 MG PO TABS: 1.0000 | ORAL_TABLET | Freq: Four times a day (QID) | ORAL | 0 refills | Status: DC | PRN

## 2021-05-15 NOTE — Addendum Note (Signed)
Addended by: Cleon Gustin on: 05/15/2021 11:21 AM ? ? Modules accepted: Orders ? ?

## 2021-05-15 NOTE — Progress Notes (Signed)
? ?05/15/2021 ?11:02 AM  ? ?[name] ?22-Jun-1950 ?[id number] ? ?Referring provider: Lavone Orn, MD ?[place] ?[address] ?[place],  [place] [address] ? ?Right flank pain and hematuria ? ?HPI: ?[name] is a 71yo here for evaluation for right flank pain and hematuria. He has been having right flank pain for 2 weeks. IPSS 13 QOL 3. He developed urinary frequency, urgency last week and he was started on macrobid which caused mouth sores and a rash. He switched to augmentin which then improved his LUTS. UA today shows trace glucose. KUB from today showed no calculi. The flank pain is sharp, intermittent, mild and nonraditing.  ? ? ?PMH: ?Past Medical History:  ?Diagnosis Date  ? Anxiety   ? CAD (coronary artery disease)   ? s/p Taxus DES to LAD in 2010 // Myoview 09/2018: low risk, normal EF   ? DM2 (diabetes mellitus, type 2) ([place])   ? Hx of seasonal allergies   ? Hyperlipidemia   ? Hypertension   ? ? ?Surgical History: ?Past Surgical History:  ?Procedure Laterality Date  ? ANGIOPLASTY  2010  ? stent  ? CORONARY STENT INTERVENTION N/A 05/16/2020  ? Procedure: CORONARY STENT INTERVENTION;  Surgeon: Jettie Booze, MD;  Location: [facility];  Service: Cardiovascular;  Laterality: N/A;  ? INTRAVASCULAR PRESSURE WIRE/FFR STUDY N/A 05/16/2020  ? Procedure: INTRAVASCULAR PRESSURE WIRE/FFR STUDY;  Surgeon: Jettie Booze, MD;  Location: [facility];  Service: Cardiovascular;  Laterality: N/A;  ? INTRAVASCULAR ULTRASOUND/IVUS N/A 05/16/2020  ? Procedure: Intravascular Ultrasound/IVUS;  Surgeon: Jettie Booze, MD;  Location: [facility];  Service: Cardiovascular;  Laterality: N/A;  ? LEFT HEART CATH AND CORONARY ANGIOGRAPHY N/A 05/16/2020  ? Procedure: LEFT HEART CATH AND CORONARY ANGIOGRAPHY;  Surgeon: Jettie Booze, MD;  Location: [facility];  Service: Cardiovascular;  Laterality: N/A;  ? [place]  ? L3 and T12  ? ? ?Home Medications:   ?Allergies as of 05/15/2021   ? ?   Reactions  ? Chicken Allergy Shortness Of Breath, Nausea And Vomiting, Other (See Comments)  ? Headache ?SAME REACTION(S) TO [place]!!!!  ? Morphine Shortness Of Breath  ? Other Anaphylaxis  ? Ate a fruit salad and had to go to the E.R.  ? Pineapple Anaphylaxis, Rash  ? Sulfa Antibiotics Shortness Of Breath  ? Headache ?SAME REACTION(S) TO [place]!!!!  ? Amlodipine Besylate   ? Other reaction(s): dizziness and edema  ? Chlorthalidone   ? Other reaction(s): headache  ? Naproxen Rash  ? Other reaction(s): pseudoporphyria  ? ?  ? ?  ?Medication List  ?  ? ?  ? Accurate as of May 15, 2021 11:02 AM. If you have any questions, ask your nurse or doctor.  ?  ?  ? ?  ? ?acetaminophen 500 MG tablet ?Commonly known as: TYLENOL ?Take 1,000 mg by mouth every 8 (eight) hours as needed for moderate pain. ?  ?amLODipine 5 MG tablet ?Commonly known as: NORVASC ?Take 5 mg by mouth daily. ?  ?aspirin EC 81 MG tablet ?Take 81 mg by mouth daily. ?  ?atorvastatin 80 MG tablet ?Commonly known as: LIPITOR ?TAKE 1 TABLET (80 MG TOTAL) BY MOUTH DAILY. ?  ?Biotin 10000 MCG Tabs ?Take 1,000 mg by mouth daily. ?  ?clopidogrel 75 MG tablet ?Commonly known as: PLAVIX ?TAKE 1 TABLET (75 MG TOTAL) BY MOUTH DAILY WITH BREAKFAST. ?  ?Co Q 10 100 MG Caps ?Take  100 mg by mouth daily. ?  ?cyclobenzaprine 10 MG tablet ?Commonly known as: FLEXERIL ?Take 10 mg by mouth 3 (three) times daily as needed for muscle spasms. ?  ?diphenhydramine-acetaminophen 25-500 MG Tabs tablet ?Commonly known as: TYLENOL PM ?Take 1 tablet by mouth at bedtime. ?  ?finasteride 5 MG tablet ?Commonly known as: PROSCAR ?Take 1 tablet (5 mg total) by mouth daily. ?  ?fluticasone 50 MCG/ACT nasal spray ?Commonly known as: FLONASE ?Place 1 spray into both nostrils as needed for allergies. ?  ?gabapentin 300 MG capsule ?Commonly known as: NEURONTIN ?Take 600 mg by mouth at bedtime. And may take an additional 300 mg once a day if needed ?  ?metFORMIN  500 MG 24 hr tablet ?Commonly known as: GLUCOPHAGE-XR ?Take 500 mg by mouth daily. ?  ?nitrofurantoin (macrocrystal-monohydrate) 100 MG capsule ?Commonly known as: MACROBID ?Take 1 capsule by mouth twice daily ?  ?nitrofurantoin 100 MG capsule ?Commonly known as: MACRODANTIN ?Take 1 capsule (100 mg total) by mouth 2 (two) times daily. ?  ?nitroGLYCERIN 0.4 MG SL tablet ?Commonly known as: NITROSTAT ?Place 1 tablet (0.4 mg total) under the tongue every 5 (five) minutes as needed for chest pain. ?  ?pantoprazole 40 MG tablet ?Commonly known as: PROTONIX ?Take 40 mg by mouth as needed (for acid reflux). ?  ?SAW PALMETTO PO ?Take 100 mg by mouth 2 (two) times daily. ?  ?tamsulosin 0.4 MG Caps capsule ?Commonly known as: FLOMAX ?Take 1 capsule (0.4 mg total) by mouth at bedtime. ?  ?traMADol 50 MG tablet ?Commonly known as: ULTRAM ?Take 50 mg by mouth daily as needed (for pain). ?  ?valsartan 80 MG tablet ?Commonly known as: DIOVAN ?TAKE 1 TABLET BY MOUTH DAILY ?  ?vitamin B-12 1000 MCG tablet ?Commonly known as: CYANOCOBALAMIN ?Take 1,000 mcg by mouth 3 (three) times a week. ?  ? ?  ? ? ?Allergies:  ?Allergies  ?Allergen Reactions  ? Chicken Allergy Shortness Of Breath, Nausea And Vomiting and Other (See Comments)  ?  Headache ?SAME REACTION(S) TO [place]!!!!  ? Morphine Shortness Of Breath  ? Other Anaphylaxis  ?  Ate a fruit salad and had to go to the E.R.  ? Pineapple Anaphylaxis and Rash  ? Sulfa Antibiotics Shortness Of Breath  ?  Headache ?SAME REACTION(S) TO [place]!!!!  ? Amlodipine Besylate   ?  Other reaction(s): dizziness and edema  ? Chlorthalidone   ?  Other reaction(s): headache  ? Naproxen Rash  ?  Other reaction(s): pseudoporphyria  ? ? ?Family History: ?Family History  ?Problem Relation Age of Onset  ? Breast cancer Mother   ? Lung cancer Father   ? Colon cancer Neg Hx   ? Stomach cancer Neg Hx   ? Esophageal cancer Neg Hx   ? Pancreatic cancer Neg Hx   ? ? ?Social History:  reports that he has never  smoked. He has never used smokeless tobacco. He reports current alcohol use. He reports that he does not use drugs. ? ?ROS: ?All other review of systems were reviewed and are negative except what is noted above in HPI ? ?Physical Exam: ?BP (!) 162/74   Pulse 61   ?Constitutional:  Alert and oriented, No acute distress. ?HEENT: [place] AT, moist mucus membranes.  Trachea midline, no masses. ?Cardiovascular: No clubbing, cyanosis, or edema. ?Respiratory: Normal respiratory effort, no increased work of breathing. ?GI: Abdomen is soft, nontender, nondistended, no abdominal masses ?GU: No CVA tenderness.  ?Lymph: No cervical or inguinal lymphadenopathy. ?  Skin: No rashes, bruises or suspicious lesions. ?Neurologic: Grossly intact, no focal deficits, moving all 4 extremities. ?Psychiatric: Normal mood and affect. ? ?Laboratory Data: ?Lab Results  ?Component Value Date  ? WBC 6.2 05/31/2020  ? HGB 14.6 05/31/2020  ? HCT 42.9 05/31/2020  ? MCV 90 05/31/2020  ? PLT 133 (L) 05/31/2020  ? ? ?Lab Results  ?Component Value Date  ? CREATININE 1.04 05/31/2020  ? ? ?No results found for: PSA ? ?No results found for: TESTOSTERONE ? ?No results found for: HGBA1C ? ?Urinalysis ?   ?Component Value Date/Time  ? COLORURINE YELLOW 06/27/2018 1120  ? APPEARANCEUR Clear 09/20/2020 1536  ? LABSPEC 1.011 06/27/2018 1120  ? PHURINE 5.0 06/27/2018 1120  ? GLUCOSEU Trace (A) 09/20/2020 1536  ? HGBUR NEGATIVE 06/27/2018 1120  ? BILIRUBINUR Negative 09/20/2020 1536  ? KETONESUR NEGATIVE 06/27/2018 1120  ? PROTEINUR Negative 09/20/2020 1536  ? PROTEINUR NEGATIVE 06/27/2018 1120  ? NITRITE Negative 09/20/2020 1536  ? NITRITE NEGATIVE 06/27/2018 1120  ? LEUKOCYTESUR 1+ (A) 09/20/2020 1536  ? LEUKOCYTESUR NEGATIVE 06/27/2018 1120  ? ? ?Lab Results  ?Component Value Date  ? LABMICR See below: 09/20/2020  ? WBCUA 6-10 (A) 09/20/2020  ? LABEPIT 0-10 09/20/2020  ? BACTERIA Moderate (A) 09/20/2020  ? ? ?Pertinent Imaging: ?KUb today: Images reviewed and  discussed with the patient  ?No results found for this or any previous visit. ? ?No results found for this or any previous visit. ? ?No results found for this or any previous visit. ? ?No results found for this or

## 2021-05-15 NOTE — Patient Instructions (Signed)
Urinary Tract Infection, Adult A urinary tract infection (UTI) is an infection of any part of the urinary tract. The urinary tract includes the kidneys, ureters, bladder, and urethra. These organs make, store, and get rid of urine in the body. An upper UTI affects the ureters and kidneys. A lower UTI affects the bladder and urethra. What are the causes? Most urinary tract infections are caused by bacteria in your genital area around your urethra, where urine leaves your body. These bacteria grow and cause inflammation of your urinary tract. What increases the risk? You are more likely to develop this condition if: You have a urinary catheter that stays in place. You are not able to control when you urinate or have a bowel movement (incontinence). You are male and you: Use a spermicide or diaphragm for birth control. Have low estrogen levels. Are pregnant. You have certain genes that increase your risk. You are sexually active. You take antibiotic medicines. You have a condition that causes your flow of urine to slow down, such as: An enlarged prostate, if you are male. Blockage in your urethra. A kidney stone. A nerve condition that affects your bladder control (neurogenic bladder). Not getting enough to drink, or not urinating often. You have certain medical conditions, such as: Diabetes. A weak disease-fighting system (immunesystem). Sickle cell disease. Gout. Spinal cord injury. What are the signs or symptoms? Symptoms of this condition include: Needing to urinate right away (urgency). Frequent urination. This may include small amounts of urine each time you urinate. Pain or burning with urination. Blood in the urine. Urine that smells bad or unusual. Trouble urinating. Cloudy urine. Vaginal discharge, if you are male. Pain in the abdomen or the lower back. You may also have: Vomiting or a decreased appetite. Confusion. Irritability or tiredness. A fever or  chills. Diarrhea. The first symptom in older adults may be confusion. In some cases, they may not have any symptoms until the infection has worsened. How is this diagnosed? This condition is diagnosed based on your medical history and a physical exam. You may also have other tests, including: Urine tests. Blood tests. Tests for STIs (sexually transmitted infections). If you have had more than one UTI, a cystoscopy or imaging studies may be done to determine the cause of the infections. How is this treated? Treatment for this condition includes: Antibiotic medicine. Over-the-counter medicines to treat discomfort. Drinking enough water to stay hydrated. If you have frequent infections or have other conditions such as a kidney stone, you may need to see a health care provider who specializes in the urinary tract (urologist). In rare cases, urinary tract infections can cause sepsis. Sepsis is a life-threatening condition that occurs when the body responds to an infection. Sepsis is treated in the hospital with IV antibiotics, fluids, and other medicines. Follow these instructions at home: Medicines Take over-the-counter and prescription medicines only as told by your health care provider. If you were prescribed an antibiotic medicine, take it as told by your health care provider. Do not stop using the antibiotic even if you start to feel better. General instructions Make sure you: Empty your bladder often and completely. Do not hold urine for long periods of time. Empty your bladder after sex. Wipe from front to back after urinating or having a bowel movement if you are male. Use each tissue only one time when you wipe. Drink enough fluid to keep your urine pale yellow. Keep all follow-up visits. This is important. Contact a health care provider   if: Your symptoms do not get better after 1-2 days. Your symptoms go away and then return. Get help right away if: You have severe pain in your  back or your lower abdomen. You have a fever or chills. You have nausea or vomiting. Summary A urinary tract infection (UTI) is an infection of any part of the urinary tract, which includes the kidneys, ureters, bladder, and urethra. Most urinary tract infections are caused by bacteria in your genital area. Treatment for this condition often includes antibiotic medicines. If you were prescribed an antibiotic medicine, take it as told by your health care provider. Do not stop using the antibiotic even if you start to feel better. Keep all follow-up visits. This is important. This information is not intended to replace advice given to you by your health care provider. Make sure you discuss any questions you have with your health care provider. Document Revised: 09/17/2019 Document Reviewed: 09/17/2019 Elsevier Patient Education  2022 [employer]  

## 2021-05-16 LAB — URINALYSIS, ROUTINE W REFLEX MICROSCOPIC
Bilirubin, UA: NEGATIVE
Ketones, UA: NEGATIVE
Leukocytes,UA: NEGATIVE
Nitrite, UA: NEGATIVE
Protein,UA: NEGATIVE
RBC, UA: NEGATIVE
Specific Gravity, UA: 1.02 (ref 1.005–1.030)
Urobilinogen, Ur: 0.2 mg/dL (ref 0.2–1.0)
pH, UA: 5.5 (ref 5.0–7.5)

## 2021-06-06 ENCOUNTER — Other Ambulatory Visit: Payer: Self-pay

## 2021-06-06 MED ORDER — ATORVASTATIN CALCIUM 80 MG PO TABS: 80.0000 mg | ORAL_TABLET | Freq: Every day | ORAL | 0 refills | Status: DC

## 2021-06-06 NOTE — Telephone Encounter (Signed)
[facility] requesting a refill on Clopidogrel 75 mg tablets. This medication was D/C off of pt's medication list. Does pt still supposed to be taking this medication? Please address ?

## 2021-06-06 NOTE — Telephone Encounter (Signed)
Can you please add this medication back to pt's medication list? Thanks  ?

## 2021-06-15 ENCOUNTER — Telehealth: Payer: Self-pay

## 2021-06-15 DIAGNOSIS — R319 Hematuria, unspecified: Secondary | ICD-10-CM

## 2021-06-15 MED ORDER — CEFPODOXIME PROXETIL 200 MG PO TABS: 200.0000 mg | ORAL_TABLET | Freq: Two times a day (BID) | ORAL | 0 refills | Status: DC

## 2021-06-15 MED ORDER — CEFPODOXIME PROXETIL 200 MG PO TABS: 200.0000 mg | ORAL_TABLET | Freq: Two times a day (BID) | ORAL | Status: DC

## 2021-06-15 NOTE — Telephone Encounter (Signed)
Dr. Alyson Ingles gave verbal order to send in Vantin '200mg'$  BID for 7 days.  Patient aware and sent to local pharmacy.  ?

## 2021-06-15 NOTE — Addendum Note (Signed)
Addended by: Dorisann Frames on: 06/15/2021 04:19 PM ? ? Modules accepted: Orders ? ?

## 2021-06-15 NOTE — Telephone Encounter (Signed)
Patient needing to discuss medication to take. ?Still having blood in urine.  He is out of town. ? ?Please advise. ? ? ?Call back:  [phone] ? ?Thanks, ?Helene Kelp ?

## 2021-06-21 ENCOUNTER — Telehealth: Payer: Self-pay

## 2021-06-21 MED ORDER — FLUCONAZOLE 150 MG PO TABS: 150.0000 mg | ORAL_TABLET | Freq: Every day | ORAL | 0 refills | Status: DC

## 2021-06-21 NOTE — Telephone Encounter (Signed)
Patients wife called stating patient the head of patients penis is red, itchy and burns when he voids. Wife states it looks like a yeast infection.  ?  ?Diflucan sent to pharmacy. ?

## 2021-06-25 DIAGNOSIS — D485 Neoplasm of uncertain behavior of skin: Secondary | ICD-10-CM | POA: Diagnosis not present

## 2021-06-25 DIAGNOSIS — L821 Other seborrheic keratosis: Secondary | ICD-10-CM | POA: Diagnosis not present

## 2021-06-25 DIAGNOSIS — L570 Actinic keratosis: Secondary | ICD-10-CM | POA: Diagnosis not present

## 2021-06-25 DIAGNOSIS — C44622 Squamous cell carcinoma of skin of right upper limb, including shoulder: Secondary | ICD-10-CM | POA: Diagnosis not present

## 2021-06-27 DIAGNOSIS — M5136 Other intervertebral disc degeneration, lumbar region: Secondary | ICD-10-CM | POA: Diagnosis not present

## 2021-06-27 DIAGNOSIS — I10 Essential (primary) hypertension: Secondary | ICD-10-CM | POA: Diagnosis not present

## 2021-06-27 DIAGNOSIS — K219 Gastro-esophageal reflux disease without esophagitis: Secondary | ICD-10-CM | POA: Diagnosis not present

## 2021-06-27 DIAGNOSIS — Z86010 Personal history of colonic polyps: Secondary | ICD-10-CM | POA: Diagnosis not present

## 2021-06-27 DIAGNOSIS — Z0000 Encounter for general adult medical examination without abnormal findings: Secondary | ICD-10-CM | POA: Diagnosis not present

## 2021-06-27 DIAGNOSIS — E1169 Type 2 diabetes mellitus with other specified complication: Secondary | ICD-10-CM | POA: Diagnosis not present

## 2021-06-27 DIAGNOSIS — E782 Mixed hyperlipidemia: Secondary | ICD-10-CM | POA: Diagnosis not present

## 2021-06-27 DIAGNOSIS — I2510 Atherosclerotic heart disease of native coronary artery without angina pectoris: Secondary | ICD-10-CM | POA: Diagnosis not present

## 2021-06-28 MED ORDER — CLOPIDOGREL BISULFATE 75 MG PO TABS: 75.0000 mg | ORAL_TABLET | Freq: Every day | ORAL | 0 refills | Status: DC

## 2021-06-28 NOTE — Telephone Encounter (Signed)
Pt's medication clopidogrel 75 mg tablets were added to pt's medication list and sent to pt's preferred pharmacy. Confirmation received.  ?

## 2021-07-04 DIAGNOSIS — C44622 Squamous cell carcinoma of skin of right upper limb, including shoulder: Secondary | ICD-10-CM | POA: Diagnosis not present

## 2021-07-04 DIAGNOSIS — R58 Hemorrhage, not elsewhere classified: Secondary | ICD-10-CM | POA: Diagnosis not present

## 2021-07-04 DIAGNOSIS — L820 Inflamed seborrheic keratosis: Secondary | ICD-10-CM | POA: Diagnosis not present

## 2021-07-04 DIAGNOSIS — L538 Other specified erythematous conditions: Secondary | ICD-10-CM | POA: Diagnosis not present

## 2021-07-11 ENCOUNTER — Ambulatory Visit: Payer: Medicare Other | Admitting: Urology

## 2021-07-11 ENCOUNTER — Ambulatory Visit (HOSPITAL_COMMUNITY)
Admission: RE | Admit: 2021-07-11 | Discharge: 2021-07-11 | Disposition: A | Discharge status: Home / Self Care | Payer: Medicare Other | Source: Ambulatory Visit | Attending: Urology | Admitting: Urology

## 2021-07-11 VITALS — BP 161/66 | HR 54

## 2021-07-11 DIAGNOSIS — N138 Other obstructive and reflux uropathy: Secondary | ICD-10-CM

## 2021-07-11 DIAGNOSIS — I700 Atherosclerosis of aorta: Secondary | ICD-10-CM | POA: Diagnosis not present

## 2021-07-11 DIAGNOSIS — Z981 Arthrodesis status: Secondary | ICD-10-CM | POA: Diagnosis not present

## 2021-07-11 DIAGNOSIS — N401 Enlarged prostate with lower urinary tract symptoms: Secondary | ICD-10-CM | POA: Diagnosis not present

## 2021-07-11 DIAGNOSIS — R1011 Right upper quadrant pain: Secondary | ICD-10-CM | POA: Insufficient documentation

## 2021-07-11 DIAGNOSIS — N35819 Other urethral stricture, male, unspecified site: Secondary | ICD-10-CM | POA: Diagnosis not present

## 2021-07-11 DIAGNOSIS — R3912 Poor urinary stream: Secondary | ICD-10-CM

## 2021-07-11 DIAGNOSIS — R300 Dysuria: Secondary | ICD-10-CM

## 2021-07-11 DIAGNOSIS — R109 Unspecified abdominal pain: Secondary | ICD-10-CM | POA: Diagnosis not present

## 2021-07-11 LAB — BLADDER SCAN AMB NON-IMAGING: Scan Result: 43

## 2021-07-11 MED ORDER — CIPROFLOXACIN HCL 500 MG PO TABS: 500.0000 mg | ORAL_TABLET | Freq: Once | ORAL | Status: DC

## 2021-07-11 MED ORDER — DOXYCYCLINE HYCLATE 100 MG PO CAPS: 100.0000 mg | ORAL_CAPSULE | Freq: Two times a day (BID) | ORAL | 0 refills | Status: DC

## 2021-07-11 NOTE — Progress Notes (Signed)
07/11/2021 9:37 AM   [name] August 17, 1950 [id number]  Referring provider: Lavone Orn, MD [address] [place],  [place] [address]  Weak urinary stream   HPI: [name] is a 71yo here for evaluation of a weak urinary stream and flank pain. He has been having intermittent, sharp, mild to moderate, nonraditing left flank pain for the past week. He has a history of nephrolithiasis. No hematuria. Ct stone study from today shows to calculi. He also complains today of a weak urinary stream with associated dysuria. The intermittent dysuria and weak stream have been present for 6 months. IPSS 26 QOL 5. He has urinary hesitancy and straining to urinate.    PMH: Past Medical History:  Diagnosis Date   Anxiety    CAD (coronary artery disease)    s/p Taxus DES to LAD in 2010 // Myoview 09/2018: low risk, normal EF    DM2 (diabetes mellitus, type 2) (HCC)    Hx of seasonal allergies    Hyperlipidemia    Hypertension     Surgical History: Past Surgical History:  Procedure Laterality Date   ANGIOPLASTY  2010   stent   CORONARY STENT INTERVENTION N/A 05/16/2020   Procedure: CORONARY STENT INTERVENTION;  Surgeon: Austin Booze, MD;  Location: [facility];  Service: Cardiovascular;  Laterality: N/A;   INTRAVASCULAR PRESSURE WIRE/FFR STUDY N/A 05/16/2020   Procedure: INTRAVASCULAR PRESSURE WIRE/FFR STUDY;  Surgeon: Austin Booze, MD;  Location: [facility];  Service: Cardiovascular;  Laterality: N/A;   INTRAVASCULAR ULTRASOUND/IVUS N/A 05/16/2020   Procedure: Intravascular Ultrasound/IVUS;  Surgeon: Austin Booze, MD;  Location: [facility];  Service: Cardiovascular;  Laterality: N/A;   LEFT HEART CATH AND CORONARY ANGIOGRAPHY N/A 05/16/2020   Procedure: LEFT HEART CATH AND CORONARY ANGIOGRAPHY;  Surgeon: Austin Booze, MD;  Location: [facility];  Service: Cardiovascular;  Laterality: N/A;   LUMBAR LAMINECTOMY   1991   L3 and T12    Home Medications:  Allergies as of 07/11/2021       Reactions   Chicken Allergy Shortness Of Breath, Nausea And Vomiting, Other (See Comments)   Headache SAME REACTION(S) TO [place]!!!!   Morphine Shortness Of Breath   Other Anaphylaxis   Ate a fruit salad and had to go to the E.R.   Pineapple Anaphylaxis, Rash   Sulfa Antibiotics Shortness Of Breath   Headache SAME REACTION(S) TO [place]!!!!   Amlodipine Besylate    Other reaction(s): dizziness and edema   Chlorthalidone    Other reaction(s): headache   Naproxen Rash   Other reaction(s): pseudoporphyria        Medication List        Accurate as of Jul 11, 2021  9:37 AM. If you have any questions, ask your nurse or doctor.          acetaminophen 500 MG tablet Commonly known as: TYLENOL Take 1,000 mg by mouth every 8 (eight) hours as needed for moderate pain.   amLODipine 5 MG tablet Commonly known as: NORVASC Take 5 mg by mouth daily.   aspirin EC 81 MG tablet Take 81 mg by mouth daily.   atorvastatin 80 MG tablet Commonly known as: LIPITOR TAKE 1 TABLET (80 MG TOTAL) BY MOUTH DAILY.   Biotin 10000 MCG Tabs Take 1,000 mg by mouth daily.   cefpodoxime 200 MG tablet Commonly known as: VANTIN Take 1 tablet (200 mg total) by mouth 2 (two) times daily.  citalopram 10 MG tablet Commonly known as: CELEXA Take 10 mg by mouth daily.   clopidogrel 75 MG tablet Commonly known as: PLAVIX Take 1 tablet (75 mg total) by mouth daily.   Co Q 10 100 MG Caps Take 100 mg by mouth daily.   cyclobenzaprine 10 MG tablet Commonly known as: FLEXERIL Take 1 tablet (10 mg total) by mouth 3 (three) times daily as needed for muscle spasms.   diphenhydramine-acetaminophen 25-500 MG Tabs tablet Commonly known as: TYLENOL PM Take 1 tablet by mouth at bedtime.   doxycycline 100 MG capsule Commonly known as: VIBRAMYCIN Take 1 capsule (100 mg total) by mouth every 12 (twelve) hours.   finasteride 5  MG tablet Commonly known as: PROSCAR Take 1 tablet (5 mg total) by mouth daily.   fluconazole 150 MG tablet Commonly known as: DIFLUCAN Take 1 tablet (150 mg total) by mouth daily. 150 mg by mouth daily x 2 days.   fluorouracil 5 % cream Commonly known as: EFUDEX SMARTSIG:sparingly Topical Twice Daily   fluticasone 50 MCG/ACT nasal spray Commonly known as: FLONASE Place 1 spray into both nostrils as needed for allergies.   gabapentin 300 MG capsule Commonly known as: NEURONTIN Take 600 mg by mouth at bedtime. And may take an additional 300 mg once a day if needed   HYDROcodone-acetaminophen 5-325 MG tablet Commonly known as: Norco Take 1 tablet by mouth every 6 (six) hours as needed for moderate pain.   metFORMIN 500 MG 24 hr tablet Commonly known as: GLUCOPHAGE-XR Take 500 mg by mouth daily.   mupirocin ointment 2 % Commonly known as: BACTROBAN Apply 1 application. topically 3 (three) times daily.   nitrofurantoin (macrocrystal-monohydrate) 100 MG capsule Commonly known as: MACROBID Take 1 capsule by mouth twice daily   nitrofurantoin 100 MG capsule Commonly known as: MACRODANTIN Take 1 capsule (100 mg total) by mouth 2 (two) times daily.   nitroGLYCERIN 0.4 MG SL tablet Commonly known as: NITROSTAT Place 1 tablet (0.4 mg total) under the tongue every 5 (five) minutes as needed for chest pain.   pantoprazole 40 MG tablet Commonly known as: PROTONIX Take 40 mg by mouth as needed (for acid reflux).   SAW PALMETTO PO Take 100 mg by mouth 2 (two) times daily.   tamsulosin 0.4 MG Caps capsule Commonly known as: FLOMAX Take 1 capsule (0.4 mg total) by mouth at bedtime.   traMADol 50 MG tablet Commonly known as: ULTRAM Take 50 mg by mouth daily as needed (for pain).   valsartan 80 MG tablet Commonly known as: DIOVAN TAKE 1 TABLET BY MOUTH DAILY   vitamin B-12 1000 MCG tablet Commonly known as: CYANOCOBALAMIN Take 1,000 mcg by mouth 3 (three) times a week.         Allergies:  Allergies  Allergen Reactions   Chicken Allergy Shortness Of Breath, Nausea And Vomiting and Other (See Comments)    Headache SAME REACTION(S) TO [place]!!!!   Morphine Shortness Of Breath   Other Anaphylaxis    Ate a fruit salad and had to go to the E.R.   Pineapple Anaphylaxis and Rash   Sulfa Antibiotics Shortness Of Breath    Headache SAME REACTION(S) TO [place]!!!!   Amlodipine Besylate     Other reaction(s): dizziness and edema   Chlorthalidone     Other reaction(s): headache   Naproxen Rash    Other reaction(s): pseudoporphyria    Family History: Family History  Problem Relation Age of Onset   Breast cancer Mother  Lung cancer Father    Colon cancer Neg Hx    Stomach cancer Neg Hx    Esophageal cancer Neg Hx    Pancreatic cancer Neg Hx     Social History:  reports that he has never smoked. He has never used smokeless tobacco. He reports current alcohol use. He reports that he does not use drugs.  ROS: All other review of systems were reviewed and are negative except what is noted above in HPI  Physical Exam: BP (!) 161/66   Pulse (!) 54   Constitutional:  Alert and oriented, No acute distress. HEENT: [place] AT, moist mucus membranes.  Trachea midline, no masses. Cardiovascular: No clubbing, cyanosis, or edema. Respiratory: Normal respiratory effort, no increased work of breathing. GI: Abdomen is soft, nontender, nondistended, no abdominal masses GU: No CVA tenderness.  Lymph: No cervical or inguinal lymphadenopathy. Skin: No rashes, bruises or suspicious lesions. Neurologic: Grossly intact, no focal deficits, moving all 4 extremities. Psychiatric: Normal mood and affect.  Laboratory Data: Lab Results  Component Value Date   WBC 6.2 05/31/2020   HGB 14.6 05/31/2020   HCT 42.9 05/31/2020   MCV 90 05/31/2020   PLT 133 (L) 05/31/2020    Lab Results  Component Value Date   CREATININE 1.04 05/31/2020    No results found for:  PSA  No results found for: TESTOSTERONE  No results found for: HGBA1C  Urinalysis    Component Value Date/Time   COLORURINE YELLOW 06/27/2018 1120   APPEARANCEUR Clear 05/15/2021 1052   LABSPEC 1.011 06/27/2018 1120   PHURINE 5.0 06/27/2018 1120   GLUCOSEU Trace (A) 05/15/2021 [place] 06/27/2018 1120   BILIRUBINUR Negative 05/15/2021 [place] 06/27/2018 1120   PROTEINUR Negative 05/15/2021 1052   PROTEINUR NEGATIVE 06/27/2018 1120   NITRITE Negative 05/15/2021 1052   NITRITE NEGATIVE 06/27/2018 1120   LEUKOCYTESUR Negative 05/15/2021 1052   LEUKOCYTESUR NEGATIVE 06/27/2018 1120    Lab Results  Component Value Date   LABMICR See below: 09/20/2020   WBCUA 6-10 (A) 09/20/2020   LABEPIT 0-10 09/20/2020   BACTERIA Moderate (A) 09/20/2020    Pertinent Imaging: CT stone study today: Images reviewed and discussed with the patient  Results for orders placed in visit on 05/14/21  DG Abd 1 View  Narrative CLINICAL DATA:  Kidney stones  EXAM: ABDOMEN - 1 VIEW  COMPARISON:  CT 02/08/2018.  KUB 07/12/2019  FINDINGS: The bowel gas pattern is normal. No radio-opaque calculi or other significant radiographic abnormality are seen. Postoperative changes at the thoracolumbar and lumbosacral junctions.  IMPRESSION: Negative.   Electronically Signed By: Rolm Baptise M.D. On: 05/16/2021 09:08  No results found for this or any previous visit.  No results found for this or any previous visit.  No results found for this or any previous visit.  Results for orders placed during the hospital encounter of 01/30/12  US Renal  Narrative *RADIOLOGY REPORT*  Clinical Data:  Lower back and abdominal pain.  Pyelonephritis. History of kidney stones.  RENAL/URINARY TRACT ULTRASOUND COMPLETE  Comparison:  None.  Findings:  Right Kidney:  Right kidney is normal in size and echogenicity with no mass or hydronephrosis.  The right kidney measures  11.6 cm.  Left Kidney:  Normal in size and parenchymal echogenicity.  No evidence of mass or hydronephrosis.  The left kidney measures 10.9 cm.  Bladder:  Appears normal for degree of bladder distention.  IMPRESSION: Normal study.   Original Report Authenticated  By: Lajean Manes, M.D.  No results found for this or any previous visit.  No results found for this or any previous visit.  No results found for this or any previous visit.   Assessment & Plan:    1. Dysuria -likely related to urethral stricture We will add doxycycline '100mg'$  BID for 7 days - Urinalysis, Routine w reflex microscopic  2. Benign prostatic hyperplasia with urinary obstruction -continue flomax and finasteride  3. Weak urinary stream -likely related to urethral stricture. We will continue flomax 0.'4mg'$  daily and finasteride '5mg'$  daily   No follow-ups on file.  Nicolette Bang, MD  [facility]    Cystoscopy Procedure Note  Patient identification was confirmed, informed consent was obtained, and patient was prepped using Betadine solution.  Lidocaine jelly was administered per urethral meatus.     Pre-Procedure: - Inspection reveals a normal caliber ureteral meatus.  Procedure: The flexible cystoscope was introduced without difficulty - Penile urethral stricture encountered. Using sequential dilators the urethra was dilated from 6 french to 20 french. - Enlarged prostate  - Normal bladder neck - Bilateral ureteral orifices identified - Bladder mucosa  reveals no ulcers, tumors, or lesions - No bladder stones - No trabeculation   Post-Procedure: - Patient tolerated the procedure well  Assessment/ Plan:   Return in about 6 weeks (around 08/22/2021) for PVR.  Nicolette Bang, MD

## 2021-07-11 NOTE — Progress Notes (Signed)
post void residual= 43

## 2021-07-12 DIAGNOSIS — R300 Dysuria: Secondary | ICD-10-CM | POA: Diagnosis not present

## 2021-07-12 LAB — URINALYSIS, ROUTINE W REFLEX MICROSCOPIC
Bilirubin, UA: NEGATIVE
Ketones, UA: NEGATIVE
Nitrite, UA: NEGATIVE
Protein,UA: NEGATIVE
RBC, UA: NEGATIVE
Specific Gravity, UA: 1.025 (ref 1.005–1.030)
Urobilinogen, Ur: 0.2 mg/dL (ref 0.2–1.0)
pH, UA: 5.5 (ref 5.0–7.5)

## 2021-07-12 LAB — MICROSCOPIC EXAMINATION: Renal Epithel, UA: NONE SEEN /hpf

## 2021-07-14 LAB — URINE CULTURE

## 2021-07-23 ENCOUNTER — Encounter: Payer: Self-pay | Admitting: Urology

## 2021-07-23 NOTE — Patient Instructions (Signed)
Urethral Stricture ? ?Urethral stricture is narrowing of the tube (urethra) that carries urine from the bladder out of the body. The urethra can become narrow due to scar tissue from an injury or infection. This can make it difficult to pass urine. ?In women, the urethra opens above the vaginal opening. In men, the urethra opens at the tip of the penis, and the urethra is much longer than it is in women. Because of the length of the male urethra, urethral stricture is much more common in men. This condition is treated with surgery. ?What are the causes? ?In both men and women, common causes of urethral stricture include: ?Urinary tract infection (UTI). ?Sexually transmitted infection (STI). ?Use of a tube placed into the urethra to drain urine from the bladder (urinary catheter). ?Urinary tract surgery. ?In men, common causes of urethral stricture include: ?A severe injury to the pelvis. ?Prostate surgery. ?Injury to the penis. ?In many cases, the cause of urethral stricture is not known. ?What increases the risk? ?You are more likely to develop this condition if you: ?Are male. Men who have had prostate surgery are at risk of developing this condition. ?Use a urinary catheter. ?Have had urinary tract surgery. ?What are the signs or symptoms? ?The main symptom of this condition is difficulty passing urine. This may cause decreased urine flow, dribbling, or spraying of urine. Other symptom of this condition may include: ?Frequent UTIs. ?Blood in the urine. ?Pain when urinating. ?Swelling of the penis in men. ?Inability to pass urine (urinary obstruction). ?How is this diagnosed? ?This condition may be diagnosed based on: ?Your medical history and a physical exam. ?Urine tests to check for infection or bleeding. ?X-rays. ?Ultrasound. ?Retrograde urethrogram. This is a type of test in which dye is injected into the urethra and then an X-ray is taken. ?Urethroscopy. This is when a thin tube with a light and camera on  the end (urethroscope) is used to look at the urethra. ?How is this treated? ?This condition is treated with surgery. The type of surgery that you have depends on the severity of your condition. You may have: ?Urethral dilation. In this procedure, the narrow part of the urethra is stretched open (dilated) with dilating instruments or a small balloon. ?Urethrotomy. In this procedure, a urethroscope is placed into the urethra, and the narrow part of the urethra is cut open with a surgical blade inserted through the urethroscope. ?Open surgery. In this procedure, an incision is made in the urethra, the narrow part is removed, and the urethra is reconstructed. ?Follow these instructions at home: ? ?Take over-the-counter and prescription medicines only as told by your health care provider. ?If you were prescribed an antibiotic medicine, take it as told by your health care provider. Do not stop taking the antibiotic even if you start to feel better. ?Drink enough fluid to keep your urine pale yellow. ?Keep all follow-up visits as told by your health care provider. This is important. ?Contact a health care provider if: ?You have signs of a urinary tract infection, such as: ?Frequent urination or passing small amounts of urine frequently. ?Needing to urinate urgently. ?Pain or burning with urination. ?Urine that smells bad or unusual. ?Cloudy urine. ?Pain in the lower abdomen or back. ?Trouble urinating. ?Blood in the urine. ?Vomiting or being less hungry than normal. ?Diarrhea or abdominal pain. ?Vaginal discharge, if you are male. ?Your symptoms are getting worse instead of better. ?Get help right away if: ?You cannot pass urine. ?You have a fever. ?  You have swelling, bruising, or discoloration of your genital area. This includes the penis, scrotum, and inner thighs for men, and the outer genital organs (vulva) and inner thighs for women. ?You develop swelling in your legs. ?You have difficulty  breathing. ?Summary ?Urethral stricture is narrowing of the tube (urethra) that carries urine from the bladder out of the body. The urethra can become narrow due to scar tissue from an injury or infection. ?This condition can make it difficult to pass urine. ?This condition is treated with surgery. The type of surgery that you have depends on the severity of your condition. ?Contact a health care provider if your symptoms get worse or you have signs of a urinary tract infection. ?This information is not intended to replace advice given to you by your health care provider. Make sure you discuss any questions you have with your health care provider. ?Document Revised: 12/12/2020 Document Reviewed: 12/12/2020 ?Elsevier Patient Education ? 2023 Else[employer] ? ?

## 2021-08-01 ENCOUNTER — Telehealth: Payer: Self-pay

## 2021-08-01 NOTE — Telephone Encounter (Signed)
Patient called stating he took the doxycyline for 2 1/2 weeks with no problems then developed blisters on his face and arms. Patient states he did nothing else different during this time.  Patient states it has been a week since he stopped the doxycyline and the rash is starting to clear.  Please advise.

## 2021-08-02 NOTE — Telephone Encounter (Signed)
Patient called with no answer. Detailed message left. °

## 2021-08-10 ENCOUNTER — Ambulatory Visit
Admission: RE | Admit: 2021-08-10 | Discharge: 2021-08-10 | Disposition: A | Discharge status: Home / Self Care | Payer: Medicare Other | Source: Ambulatory Visit | Attending: Internal Medicine | Admitting: Internal Medicine

## 2021-08-10 ENCOUNTER — Other Ambulatory Visit: Payer: Self-pay | Admitting: Internal Medicine

## 2021-08-10 DIAGNOSIS — M25461 Effusion, right knee: Secondary | ICD-10-CM

## 2021-08-10 DIAGNOSIS — R300 Dysuria: Secondary | ICD-10-CM | POA: Diagnosis not present

## 2021-08-10 DIAGNOSIS — M7989 Other specified soft tissue disorders: Secondary | ICD-10-CM | POA: Diagnosis not present

## 2021-08-10 DIAGNOSIS — M1711 Unilateral primary osteoarthritis, right knee: Secondary | ICD-10-CM | POA: Diagnosis not present

## 2021-08-14 DIAGNOSIS — M25561 Pain in right knee: Secondary | ICD-10-CM | POA: Diagnosis not present

## 2021-08-14 DIAGNOSIS — M7041 Prepatellar bursitis, right knee: Secondary | ICD-10-CM | POA: Diagnosis not present

## 2021-08-22 ENCOUNTER — Ambulatory Visit: Payer: Medicare Other | Admitting: Urology

## 2021-08-24 ENCOUNTER — Ambulatory Visit: Payer: Medicare Other | Admitting: Urology

## 2021-08-24 VITALS — BP 96/52 | HR 62

## 2021-08-24 DIAGNOSIS — N138 Other obstructive and reflux uropathy: Secondary | ICD-10-CM | POA: Diagnosis not present

## 2021-08-24 DIAGNOSIS — N401 Enlarged prostate with lower urinary tract symptoms: Secondary | ICD-10-CM | POA: Diagnosis not present

## 2021-08-24 LAB — URINALYSIS, ROUTINE W REFLEX MICROSCOPIC
Bilirubin, UA: NEGATIVE
Ketones, UA: NEGATIVE
Nitrite, UA: NEGATIVE
RBC, UA: NEGATIVE
Specific Gravity, UA: 1.025 (ref 1.005–1.030)
Urobilinogen, Ur: 0.2 mg/dL (ref 0.2–1.0)
pH, UA: 5 (ref 5.0–7.5)

## 2021-08-24 LAB — MICROSCOPIC EXAMINATION
RBC, Urine: NONE SEEN /hpf (ref 0–2)
Renal Epithel, UA: NONE SEEN /hpf

## 2021-08-24 LAB — BLADDER SCAN AMB NON-IMAGING: Scan Result: 5

## 2021-08-24 MED ORDER — AMOXICILLIN-POT CLAVULANATE 875-125 MG PO TABS: 1.0000 | ORAL_TABLET | Freq: Two times a day (BID) | ORAL | 0 refills | Status: DC

## 2021-08-24 MED ORDER — FLUCONAZOLE 100 MG PO TABS: 100.0000 mg | ORAL_TABLET | Freq: Every day | ORAL | 0 refills | Status: DC

## 2021-08-24 MED ORDER — CIPROFLOXACIN HCL 500 MG PO TABS
500.0000 mg | ORAL_TABLET | Freq: Once | ORAL | Status: AC
  Administered 2021-08-24: 500 mg via ORAL

## 2021-08-24 NOTE — Progress Notes (Signed)
   08/24/21  CC: difficulty urinating  HPI:  Blood pressure (!) 96/52, pulse 62. NED. A&Ox3.   No respiratory distress   Abd soft, NT, ND Normal phallus with bilateral descended testicles  Cystoscopy Procedure Note  Patient identification was confirmed, informed consent was obtained, and patient was prepped using Betadine solution.  Lidocaine jelly was administered per urethral meatus.     Pre-Procedure: - Inspection reveals a normal caliber ureteral meatus.  Procedure: The flexible cystoscope was introduced without difficulty - No urethral strictures/lesions are present. Urethritis present - Enlarged prostate  - Normal bladder neck - Bilateral ureteral orifices identified - Bladder mucosa  reveals no ulcers, tumors, or lesions - No bladder stones - No trabeculation     Post-Procedure: - Patient tolerated the procedure well  Assessment/ Plan: Augmentin 87 BID for 7 days. Diflucan '100mg'$  daily for 7 days  No follow-ups on file.  Nicolette Bang, MD

## 2021-08-30 ENCOUNTER — Other Ambulatory Visit: Payer: Self-pay | Admitting: Cardiology

## 2021-09-06 ENCOUNTER — Encounter: Payer: Self-pay | Admitting: Urology

## 2021-09-06 NOTE — Patient Instructions (Signed)
Urethritis, Adult  Urethritis is a swelling (inflammation) of the urethra. The urethra is the tube that drains urine from the bladder. It is important to get treatment for this condition early. Delayed treatment may lead to complications, such as an infection in the urinary tract (ureters, kidneys, and bladder). What are the causes? This condition may be caused by: Germs that are spread through sexual contact. This is the leading cause of urethritis. This may include bacterial or viral infections. Injury to the urethra. This can happen after a thin, flexible tube (catheter) is inserted into the urethra to drain urine, or after medical instruments or foreign bodies are inserted into the area. Chemical irritation. This may include contact with spermicide or prolonged contact with chemicals in bubble bath, shampoo, or perfumed soaps. A disease that causes inflammation. This is rare. What increases the risk? The following factors may make you more likely to develop this condition: Having sex without using a condom. Having multiple sexual partners. Having poor hygiene. What are the signs or symptoms? Symptoms of this condition include: Pain with urination. Frequent urination. An urgent need to urinate. Itching and pain in the vagina or penis. Discharge or bleeding coming from the penis. Most women have no symptoms. How is this diagnosed? This condition may be diagnosed based on: Your symptoms. Your medical history. A physical exam. Tests may also be done. These may include: Urine tests. Swabs from the urethra. How is this treated? Treatment for this condition depends on the cause. Urethritis caused by a bacterial infection is treated with antibiotic medicine. Sexual partners must also be treated. Follow these instructions at home: Medicines Take over-the-counter and prescription medicines only as told by your health care provider. If you were prescribed an antibiotic, take it as told  by your health care provider. Do not stop taking the antibiotic even if you start to feel better. Lifestyle Avoid using perfumed soaps, bubble bath, and shampoo when you bathe or shower. Rinse the vaginal area after bathing. Wear cotton underwear. Not wearing underwear when going to sleep can help. Make sure to wipe from front to back after using the toilet if you are male. Do not have sex until your health care provider approves. When you do have sex, be sure to practice safe sex. Any sexual partners you have had in the past 60 days should be treated. General instructions Drink enough fluid to keep your urine pale yellow. It is up to you to get your test results. Ask your health care provider, or the department that is doing the test, when your results will be ready. Keep all follow-up visits. This is important. Get tested again 3 months after treatment to make sure the infection is gone. It is important that your sexual partner also gets tested again. Contact a health care provider if: Your symptoms have not improved after 3 days. Your symptoms get worse. You have eye redness or pain. You develop abdominal pain or pelvic pain (in females). You develop joint pain or a rash. You have a fever or chills. Get help right away if: You have severe pain in the belly, back, or side. You vomit repeatedly. Summary Urethritis is a swelling (inflammation) of the urethra. Germs that are spread through sexual contact are the most common cause of this condition. It is important to get treatment for this condition early. Delayed treatment may lead to complications. Treatment for this condition depends on the cause. Any sexual partners must also be treated. This information is not   intended to replace advice given to you by your health care provider. Make sure you discuss any questions you have with your health care provider. Document Revised: 09/12/2019 Document Reviewed: 09/12/2019 Elsevier Patient  Education  2023 [employer] Inc.  

## 2021-09-14 DIAGNOSIS — M7041 Prepatellar bursitis, right knee: Secondary | ICD-10-CM | POA: Diagnosis not present

## 2021-09-19 ENCOUNTER — Other Ambulatory Visit: Payer: Self-pay | Admitting: Cardiology

## 2021-09-21 ENCOUNTER — Ambulatory Visit: Payer: Medicare Other | Admitting: Urology

## 2021-09-23 ENCOUNTER — Other Ambulatory Visit: Payer: Self-pay | Admitting: Cardiology

## 2021-09-24 ENCOUNTER — Ambulatory Visit: Payer: Medicare Other | Admitting: Urology

## 2021-09-27 DIAGNOSIS — H43393 Other vitreous opacities, bilateral: Secondary | ICD-10-CM | POA: Diagnosis not present

## 2021-09-28 ENCOUNTER — Ambulatory Visit: Payer: Medicare Other | Admitting: Urology

## 2021-10-02 ENCOUNTER — Ambulatory Visit: Payer: Medicare Other | Admitting: Urology

## 2021-10-02 VITALS — BP 150/63 | HR 48

## 2021-10-02 DIAGNOSIS — N401 Enlarged prostate with lower urinary tract symptoms: Secondary | ICD-10-CM

## 2021-10-02 DIAGNOSIS — R300 Dysuria: Secondary | ICD-10-CM

## 2021-10-02 DIAGNOSIS — N138 Other obstructive and reflux uropathy: Secondary | ICD-10-CM

## 2021-10-02 DIAGNOSIS — R319 Hematuria, unspecified: Secondary | ICD-10-CM

## 2021-10-02 DIAGNOSIS — R3912 Poor urinary stream: Secondary | ICD-10-CM

## 2021-10-02 MED ORDER — FINASTERIDE 5 MG PO TABS: 5.0000 mg | ORAL_TABLET | Freq: Every day | ORAL | 3 refills | Status: DC

## 2021-10-02 MED ORDER — AMOXICILLIN-POT CLAVULANATE 875-125 MG PO TABS: 1.0000 | ORAL_TABLET | Freq: Two times a day (BID) | ORAL | 0 refills | Status: DC

## 2021-10-02 NOTE — Progress Notes (Signed)
10/02/2021 11:24 AM   [name] 03/18/1950 [id number]  Referring provider: Lavone Orn, MD [address] [place],  [place] [address]  Followup dysuria   HPI: [name] is a 71yo here for followup for dysuria, BPH with weak urinary stream. IPSS 4 QOL 0 on finasteride. No UTI since last visit. He does urethral dilation 1-2x per week . No other complaints today   PMH: Past Medical History:  Diagnosis Date   Anxiety    CAD (coronary artery disease)    s/p Taxus DES to LAD in 2010 // Myoview 09/2018: low risk, normal EF    DM2 (diabetes mellitus, type 2) (HCC)    Hx of seasonal allergies    Hyperlipidemia    Hypertension     Surgical History: Past Surgical History:  Procedure Laterality Date   ANGIOPLASTY  2010   stent   CORONARY STENT INTERVENTION N/A 05/16/2020   Procedure: CORONARY STENT INTERVENTION;  Surgeon: Jettie Booze, MD;  Location: [facility];  Service: Cardiovascular;  Laterality: N/A;   INTRAVASCULAR PRESSURE WIRE/FFR STUDY N/A 05/16/2020   Procedure: INTRAVASCULAR PRESSURE WIRE/FFR STUDY;  Surgeon: Jettie Booze, MD;  Location: [facility];  Service: Cardiovascular;  Laterality: N/A;   INTRAVASCULAR ULTRASOUND/IVUS N/A 05/16/2020   Procedure: Intravascular Ultrasound/IVUS;  Surgeon: Jettie Booze, MD;  Location: [facility];  Service: Cardiovascular;  Laterality: N/A;   LEFT HEART CATH AND CORONARY ANGIOGRAPHY N/A 05/16/2020   Procedure: LEFT HEART CATH AND CORONARY ANGIOGRAPHY;  Surgeon: Jettie Booze, MD;  Location: [facility];  Service: Cardiovascular;  Laterality: N/A;   LUMBAR LAMINECTOMY  1991   L3 and T12    Home Medications:  Allergies as of 10/02/2021       Reactions   Chicken Allergy Shortness Of Breath, Nausea And Vomiting, Other (See Comments)   Headache SAME REACTION(S) TO [place]!!!!   Dicyclomine Hives   Morphine Shortness Of Breath   Other Anaphylaxis   Ate  a fruit salad and had to go to the E.R.   Pineapple Anaphylaxis, Rash   Sulfa Antibiotics Shortness Of Breath   Headache SAME REACTION(S) TO [place]!!!!   Amlodipine Besylate    Other reaction(s): dizziness and edema   Chlorthalidone    Other reaction(s): headache   Naproxen Rash   Other reaction(s): pseudoporphyria        Medication List        Accurate as of October 02, 2021 11:24 AM. If you have any questions, ask your nurse or doctor.          acetaminophen 500 MG tablet Commonly known as: TYLENOL Take 1,000 mg by mouth every 8 (eight) hours as needed for moderate pain.   amLODipine 5 MG tablet Commonly known as: NORVASC Take 5 mg by mouth daily.   amoxicillin-clavulanate 875-125 MG tablet Commonly known as: AUGMENTIN Take 1 tablet by mouth every 12 (twelve) hours.   aspirin EC 81 MG tablet Take 81 mg by mouth daily.   atorvastatin 80 MG tablet Commonly known as: LIPITOR Take 1 tablet by mouth once daily   Biotin 10000 MCG Tabs Take 1,000 mg by mouth daily.   Biotin 5000 MCG Tabs 1 tablet   cefpodoxime 200 MG tablet Commonly known as: VANTIN Take 1 tablet (200 mg total) by mouth 2 (two) times daily.   citalopram 10 MG tablet Commonly known as: CELEXA Take 10 mg by mouth daily.   clopidogrel 75 MG  tablet Commonly known as: PLAVIX Take 1 tablet by mouth once daily   Co Q 10 100 MG Caps Take 100 mg by mouth daily.   cyanocobalamin 1000 MCG tablet Commonly known as: VITAMIN B12 Take 1,000 mcg by mouth 3 (three) times a week.   cyclobenzaprine 10 MG tablet Commonly known as: FLEXERIL Take 1 tablet (10 mg total) by mouth 3 (three) times daily as needed for muscle spasms.   diphenhydramine-acetaminophen 25-500 MG Tabs tablet Commonly known as: TYLENOL PM Take 1 tablet by mouth at bedtime.   doxycycline 100 MG capsule Commonly known as: VIBRAMYCIN Take 1 capsule (100 mg total) by mouth every 12 (twelve) hours.   finasteride 5 MG  tablet Commonly known as: PROSCAR Take 1 tablet (5 mg total) by mouth daily.   fluconazole 150 MG tablet Commonly known as: DIFLUCAN Take 1 tablet (150 mg total) by mouth daily. 150 mg by mouth daily x 2 days.   fluconazole 100 MG tablet Commonly known as: DIFLUCAN Take 1 tablet (100 mg total) by mouth daily. X 7 days   fluorouracil 5 % cream Commonly known as: EFUDEX SMARTSIG:sparingly Topical Twice Daily   fluticasone 50 MCG/ACT nasal spray Commonly known as: FLONASE Place 1 spray into both nostrils as needed for allergies.   gabapentin 300 MG capsule Commonly known as: NEURONTIN Take 600 mg by mouth at bedtime. And may take an additional 300 mg once a day if needed   HYDROcodone-acetaminophen 5-325 MG tablet Commonly known as: Norco Take 1 tablet by mouth every 6 (six) hours as needed for moderate pain.   metFORMIN 500 MG 24 hr tablet Commonly known as: GLUCOPHAGE-XR Take 500 mg by mouth daily.   mupirocin ointment 2 % Commonly known as: BACTROBAN Apply 1 application. topically 3 (three) times daily.   nitrofurantoin (macrocrystal-monohydrate) 100 MG capsule Commonly known as: MACROBID Take 1 capsule by mouth twice daily   nitrofurantoin 100 MG capsule Commonly known as: MACRODANTIN Take 1 capsule (100 mg total) by mouth 2 (two) times daily.   nitroGLYCERIN 0.4 MG SL tablet Commonly known as: NITROSTAT Place 1 tablet (0.4 mg total) under the tongue every 5 (five) minutes as needed for chest pain.   pantoprazole 40 MG tablet Commonly known as: PROTONIX Take 40 mg by mouth as needed (for acid reflux).   SAW PALMETTO PO Take 100 mg by mouth 2 (two) times daily.   tamsulosin 0.4 MG Caps capsule Commonly known as: FLOMAX Take 1 capsule (0.4 mg total) by mouth at bedtime.   tiZANidine 4 MG tablet Commonly known as: ZANAFLEX 1 tablet as needed   traMADol 50 MG tablet Commonly known as: ULTRAM Take 50 mg by mouth daily as needed (for pain).   valsartan 80  MG tablet Commonly known as: DIOVAN TAKE 1 TABLET BY MOUTH DAILY        Allergies:  Allergies  Allergen Reactions   Chicken Allergy Shortness Of Breath, Nausea And Vomiting and Other (See Comments)    Headache SAME REACTION(S) TO [place]!!!!   Dicyclomine Hives   Morphine Shortness Of Breath   Other Anaphylaxis    Ate a fruit salad and had to go to the E.R.   Pineapple Anaphylaxis and Rash   Sulfa Antibiotics Shortness Of Breath    Headache SAME REACTION(S) TO [place]!!!!   Amlodipine Besylate     Other reaction(s): dizziness and edema   Chlorthalidone     Other reaction(s): headache   Naproxen Rash    Other reaction(s): pseudoporphyria  Family History: Family History  Problem Relation Age of Onset   Breast cancer Mother    Lung cancer Father    Colon cancer Neg Hx    Stomach cancer Neg Hx    Esophageal cancer Neg Hx    Pancreatic cancer Neg Hx     Social History:  reports that he has never smoked. He has never used smokeless tobacco. He reports current alcohol use. He reports that he does not use drugs.  ROS: All other review of systems were reviewed and are negative except what is noted above in HPI  Physical Exam: BP (!) 150/63   Pulse (!) 48   Constitutional:  Alert and oriented, No acute distress. HEENT: [place] AT, moist mucus membranes.  Trachea midline, no masses. Cardiovascular: No clubbing, cyanosis, or edema. Respiratory: Normal respiratory effort, no increased work of breathing. GI: Abdomen is soft, nontender, nondistended, no abdominal masses GU: No CVA tenderness.  Lymph: No cervical or inguinal lymphadenopathy. Skin: No rashes, bruises or suspicious lesions. Neurologic: Grossly intact, no focal deficits, moving all 4 extremities. Psychiatric: Normal mood and affect.  Laboratory Data: Lab Results  Component Value Date   WBC 6.2 05/31/2020   HGB 14.6 05/31/2020   HCT 42.9 05/31/2020   MCV 90 05/31/2020   PLT 133 (L) 05/31/2020    Lab  Results  Component Value Date   CREATININE 1.04 05/31/2020    No results found for: "PSA"  No results found for: "TESTOSTERONE"  No results found for: "HGBA1C"  Urinalysis    Component Value Date/Time   COLORURINE YELLOW 06/27/2018 1120   APPEARANCEUR Clear 08/24/2021 1057   LABSPEC 1.011 06/27/2018 1120   PHURINE 5.0 06/27/2018 1120   GLUCOSEU 2+ (A) 08/24/2021 1057   HGBUR NEGATIVE 06/27/2018 1120   BILIRUBINUR Negative 08/24/2021 1057   KETONESUR NEGATIVE 06/27/2018 1120   PROTEINUR Trace (A) 08/24/2021 1057   PROTEINUR NEGATIVE 06/27/2018 1120   NITRITE Negative 08/24/2021 1057   NITRITE NEGATIVE 06/27/2018 1120   LEUKOCYTESUR Trace (A) 08/24/2021 1057   LEUKOCYTESUR NEGATIVE 06/27/2018 1120    Lab Results  Component Value Date   LABMICR See below: 08/24/2021   WBCUA 0-5 08/24/2021   LABEPIT 0-10 08/24/2021   MUCUS Present 08/24/2021   BACTERIA Many (A) 08/24/2021    Pertinent Imaging:  Results for orders placed in visit on 05/14/21  DG Abd 1 View  Narrative CLINICAL DATA:  Kidney stones  EXAM: ABDOMEN - 1 VIEW  COMPARISON:  CT 02/08/2018.  KUB 07/12/2019  FINDINGS: The bowel gas pattern is normal. No radio-opaque calculi or other significant radiographic abnormality are seen. Postoperative changes at the thoracolumbar and lumbosacral junctions.  IMPRESSION: Negative.   Electronically Signed By: Rolm Baptise M.D. On: 05/16/2021 09:08  No results found for this or any previous visit.  No results found for this or any previous visit.  No results found for this or any previous visit.  Results for orders placed during the hospital encounter of 01/30/12  US Renal  Narrative *RADIOLOGY REPORT*  Clinical Data:  Lower back and abdominal pain.  Pyelonephritis. History of kidney stones.  RENAL/URINARY TRACT ULTRASOUND COMPLETE  Comparison:  None.  Findings:  Right Kidney:  Right kidney is normal in size and echogenicity with no mass  or hydronephrosis.  The right kidney measures 11.6 cm.  Left Kidney:  Normal in size and parenchymal echogenicity.  No evidence of mass or hydronephrosis.  The left kidney measures 10.9 cm.  Bladder:  Appears normal for  degree of bladder distention.  IMPRESSION: Normal study.   Original Report Authenticated By: Lajean Manes, M.D.  No results found for this or any previous visit.  No results found for this or any previous visit.  Results for orders placed in visit on 07/11/21  CT RENAL STONE STUDY  Narrative CLINICAL DATA:  Flank pain  EXAM: CT ABDOMEN AND PELVIS WITHOUT CONTRAST  TECHNIQUE: Multidetector CT imaging of the abdomen and pelvis was performed following the standard protocol without IV contrast.  RADIATION DOSE REDUCTION: This exam was performed according to the departmental dose-optimization program which includes automated exposure control, adjustment of the mA and/or kV according to patient size and/or use of iterative reconstruction technique.  COMPARISON:  CT abdomen and pelvis dated February 08, 2018  FINDINGS: Lower chest: Coronary artery calcifications. Mild bibasilar atelectasis of the partially visualized lung bases.  Hepatobiliary: No focal liver abnormality is seen. No gallstones, gallbladder wall thickening, or biliary dilatation.  Pancreas: Unremarkable. No pancreatic ductal dilatation or surrounding inflammatory changes.  Spleen: Normal in size without focal abnormality.  Adrenals/Urinary Tract: Bilateral adrenal glands are unremarkable. No hydronephrosis or nephrolithiasis. Bladder is unremarkable.  Stomach/Bowel: Stomach is within normal limits. Appendix appears normal. No evidence of bowel wall thickening, distention, or inflammatory changes.  Vascular/Lymphatic: Aortic atherosclerosis. No enlarged abdominal or pelvic lymph nodes.  Reproductive: Mild prostatomegaly.  Other: No abdominal wall hernia or abnormality. No  abdominopelvic ascites.  Musculoskeletal: Posterior rods at T11-T12 and posterior fusion of L5-S1. Unchanged compression deformities at L1 and T12.  IMPRESSION: 1. No acute findings in the abdomen or pelvis including no evidence of hydronephrosis or nephroureterolithiasis. 2.  Aortic Atherosclerosis (ICD10-I70.0).   Electronically Signed By: Yetta Glassman M.D. On: 07/11/2021 10:56   Assessment & Plan:    1. Benign prostatic hyperplasia with urinary obstruction -continue finasteride '5mg'$  daily - BLADDER SCAN AMB NON-IMAGING - Urinalysis, Routine w reflex microscopic  2. Dysuria -resolved  3. Weak urinary stream -continue finasteride. Patient so continue urethral dilation 1-2x per week.   No follow-ups on file.  Nicolette Bang, MD  [facility]

## 2021-10-02 NOTE — Progress Notes (Signed)
post void residual=1 

## 2021-10-04 ENCOUNTER — Encounter: Payer: Self-pay | Admitting: Gastroenterology

## 2021-10-04 LAB — URINALYSIS, ROUTINE W REFLEX MICROSCOPIC
Bilirubin, UA: NEGATIVE
Glucose, UA: NEGATIVE
Ketones, UA: NEGATIVE
Leukocytes,UA: NEGATIVE
Nitrite, UA: NEGATIVE
Protein,UA: NEGATIVE
RBC, UA: NEGATIVE
Specific Gravity, UA: 1.02 (ref 1.005–1.030)
Urobilinogen, Ur: 0.2 mg/dL (ref 0.2–1.0)
pH, UA: 5.5 (ref 5.0–7.5)

## 2021-10-09 ENCOUNTER — Encounter: Payer: Self-pay | Admitting: Urology

## 2021-10-09 NOTE — Patient Instructions (Signed)

## 2021-10-28 ENCOUNTER — Other Ambulatory Visit: Payer: Self-pay | Admitting: Cardiology

## 2021-11-28 DIAGNOSIS — I700 Atherosclerosis of aorta: Secondary | ICD-10-CM | POA: Diagnosis not present

## 2021-11-28 DIAGNOSIS — I2510 Atherosclerotic heart disease of native coronary artery without angina pectoris: Secondary | ICD-10-CM | POA: Diagnosis not present

## 2021-11-28 DIAGNOSIS — E1169 Type 2 diabetes mellitus with other specified complication: Secondary | ICD-10-CM | POA: Diagnosis not present

## 2021-11-28 DIAGNOSIS — E782 Mixed hyperlipidemia: Secondary | ICD-10-CM | POA: Diagnosis not present

## 2021-11-28 DIAGNOSIS — I10 Essential (primary) hypertension: Secondary | ICD-10-CM | POA: Diagnosis not present

## 2021-11-28 DIAGNOSIS — M5136 Other intervertebral disc degeneration, lumbar region: Secondary | ICD-10-CM | POA: Diagnosis not present

## 2021-11-28 DIAGNOSIS — K219 Gastro-esophageal reflux disease without esophagitis: Secondary | ICD-10-CM | POA: Diagnosis not present

## 2021-12-04 DIAGNOSIS — M5416 Radiculopathy, lumbar region: Secondary | ICD-10-CM | POA: Diagnosis not present

## 2021-12-04 DIAGNOSIS — M5451 Vertebrogenic low back pain: Secondary | ICD-10-CM | POA: Diagnosis not present

## 2021-12-09 ENCOUNTER — Other Ambulatory Visit: Payer: Self-pay | Admitting: Cardiology

## 2021-12-09 ENCOUNTER — Other Ambulatory Visit: Payer: Self-pay | Admitting: Urology

## 2021-12-09 DIAGNOSIS — R319 Hematuria, unspecified: Secondary | ICD-10-CM

## 2021-12-27 DIAGNOSIS — M5416 Radiculopathy, lumbar region: Secondary | ICD-10-CM | POA: Diagnosis not present

## 2022-01-02 ENCOUNTER — Ambulatory Visit: Payer: Medicare Other | Admitting: Urology

## 2022-01-02 VITALS — BP 130/62 | HR 74

## 2022-01-02 DIAGNOSIS — N138 Other obstructive and reflux uropathy: Secondary | ICD-10-CM

## 2022-01-02 DIAGNOSIS — R319 Hematuria, unspecified: Secondary | ICD-10-CM | POA: Diagnosis not present

## 2022-01-02 DIAGNOSIS — R300 Dysuria: Secondary | ICD-10-CM

## 2022-01-02 DIAGNOSIS — N401 Enlarged prostate with lower urinary tract symptoms: Secondary | ICD-10-CM | POA: Diagnosis not present

## 2022-01-02 DIAGNOSIS — R3912 Poor urinary stream: Secondary | ICD-10-CM

## 2022-01-02 LAB — BLADDER SCAN AMB NON-IMAGING: Scan Result: 4

## 2022-01-02 MED ORDER — FINASTERIDE 5 MG PO TABS: 5.0000 mg | ORAL_TABLET | Freq: Every day | ORAL | 3 refills | Status: DC

## 2022-01-02 MED ORDER — AMOXICILLIN-POT CLAVULANATE 875-125 MG PO TABS: 1.0000 | ORAL_TABLET | Freq: Two times a day (BID) | ORAL | 5 refills | Status: DC

## 2022-01-02 NOTE — Progress Notes (Signed)
post void residual =4 

## 2022-01-02 NOTE — Progress Notes (Signed)
01/02/2022 11:03 AM   Francee Piccolo Rosezena Sensor 09-20-1950 [id number]  Referring provider: Lavone Orn, MD [address] [place],  [address] [address]  Followup urethral stricture and BPH   HPI: Mr [name] is a 71yo here for followup for urethral stricture and BPH. He performs self dilation once a week. No difficulty passing catheter. IPSS 3 QOL 1 on finasteride daily. Urine stream strong. No straining to urinate. Nocturia 1x. No other compalints today   PMH: Past Medical History:  Diagnosis Date   Anxiety    CAD (coronary artery disease)    s/p Taxus DES to LAD in 2010 // Myoview 09/2018: low risk, normal EF    DM2 (diabetes mellitus, type 2) (HCC)    Hx of seasonal allergies    Hyperlipidemia    Hypertension     Surgical History: Past Surgical History:  Procedure Laterality Date   ANGIOPLASTY  2010   stent   CORONARY STENT INTERVENTION N/A 05/16/2020   Procedure: CORONARY STENT INTERVENTION;  Surgeon: Jettie Booze, MD;  Location: [facility];  Service: Cardiovascular;  Laterality: N/A;   INTRAVASCULAR PRESSURE WIRE/FFR STUDY N/A 05/16/2020   Procedure: INTRAVASCULAR PRESSURE WIRE/FFR STUDY;  Surgeon: Jettie Booze, MD;  Location: [facility];  Service: Cardiovascular;  Laterality: N/A;   INTRAVASCULAR ULTRASOUND/IVUS N/A 05/16/2020   Procedure: Intravascular Ultrasound/IVUS;  Surgeon: Jettie Booze, MD;  Location: [facility];  Service: Cardiovascular;  Laterality: N/A;   LEFT HEART CATH AND CORONARY ANGIOGRAPHY N/A 05/16/2020   Procedure: LEFT HEART CATH AND CORONARY ANGIOGRAPHY;  Surgeon: Jettie Booze, MD;  Location: [facility];  Service: Cardiovascular;  Laterality: N/A;   LUMBAR LAMINECTOMY  1991   L3 and T12    Home Medications:  Allergies as of 01/02/2022       Reactions   Chicken Allergy Shortness Of Breath, Nausea And Vomiting, Other (See Comments)   Headache SAME REACTION(S) TO [place]!!!!    Dicyclomine Hives   Morphine Shortness Of Breath   Other Anaphylaxis   Ate a fruit salad and had to go to the E.R.   Pineapple Anaphylaxis, Rash   Sulfa Antibiotics Shortness Of Breath   Headache SAME REACTION(S) TO [place]!!!!   Amlodipine Besylate    Other reaction(s): dizziness and edema   Chlorthalidone    Other reaction(s): headache   Naproxen Rash   Other reaction(s): pseudoporphyria        Medication List        Accurate as of January 02, 2022 11:03 AM. If you have any questions, ask your nurse or doctor.          acetaminophen 500 MG tablet Commonly known as: TYLENOL Take 1,000 mg by mouth every 8 (eight) hours as needed for moderate pain.   amLODipine 5 MG tablet Commonly known as: NORVASC Take 5 mg by mouth daily.   amoxicillin-clavulanate 875-125 MG tablet Commonly known as: AUGMENTIN Take 1 tablet by mouth every 12 (twelve) hours.   aspirin EC 81 MG tablet Take 81 mg by mouth daily.   atorvastatin 80 MG tablet Commonly known as: LIPITOR Take 1 tablet (80 mg total) by mouth daily.   Biotin 10000 MCG Tabs Take 1,000 mg by mouth daily.   Biotin 5000 MCG Tabs 1 tablet   cefpodoxime 200 MG tablet Commonly known as: VANTIN Take 1 tablet (200 mg total) by mouth 2 (two) times daily.   citalopram 10 MG tablet Commonly known as: CELEXA  Take 10 mg by mouth daily.   clopidogrel 75 MG tablet Commonly known as: PLAVIX Take 1 tablet by mouth once daily   Co Q 10 100 MG Caps Take 100 mg by mouth daily.   cyanocobalamin 1000 MCG tablet Commonly known as: VITAMIN B12 Take 1,000 mcg by mouth 3 (three) times a week.   cyclobenzaprine 10 MG tablet Commonly known as: FLEXERIL Take 1 tablet (10 mg total) by mouth 3 (three) times daily as needed for muscle spasms.   diphenhydramine-acetaminophen 25-500 MG Tabs tablet Commonly known as: TYLENOL PM Take 1 tablet by mouth at bedtime.   doxycycline 100 MG capsule Commonly known as: VIBRAMYCIN Take 1  capsule (100 mg total) by mouth every 12 (twelve) hours.   finasteride 5 MG tablet Commonly known as: PROSCAR Take 1 tablet (5 mg total) by mouth daily.   fluconazole 150 MG tablet Commonly known as: DIFLUCAN Take 1 tablet (150 mg total) by mouth daily. 150 mg by mouth daily x 2 days.   fluconazole 100 MG tablet Commonly known as: DIFLUCAN Take 1 tablet (100 mg total) by mouth daily. X 7 days   fluorouracil 5 % cream Commonly known as: EFUDEX SMARTSIG:sparingly Topical Twice Daily   fluticasone 50 MCG/ACT nasal spray Commonly known as: FLONASE Place 1 spray into both nostrils as needed for allergies.   gabapentin 300 MG capsule Commonly known as: NEURONTIN Take 600 mg by mouth at bedtime. And may take an additional 300 mg once a day if needed   HYDROcodone-acetaminophen 5-325 MG tablet Commonly known as: Norco Take 1 tablet by mouth every 6 (six) hours as needed for moderate pain.   metFORMIN 500 MG 24 hr tablet Commonly known as: GLUCOPHAGE-XR Take 500 mg by mouth daily.   mupirocin ointment 2 % Commonly known as: BACTROBAN Apply 1 application. topically 3 (three) times daily.   nitrofurantoin (macrocrystal-monohydrate) 100 MG capsule Commonly known as: MACROBID Take 1 capsule by mouth twice daily   nitrofurantoin 100 MG capsule Commonly known as: MACRODANTIN Take 1 capsule (100 mg total) by mouth 2 (two) times daily.   nitroGLYCERIN 0.4 MG SL tablet Commonly known as: NITROSTAT Place 1 tablet (0.4 mg total) under the tongue every 5 (five) minutes as needed for chest pain.   pantoprazole 40 MG tablet Commonly known as: PROTONIX Take 40 mg by mouth as needed (for acid reflux).   SAW PALMETTO PO Take 100 mg by mouth 2 (two) times daily.   tamsulosin 0.4 MG Caps capsule Commonly known as: FLOMAX Take 1 capsule by mouth at bedtime   tiZANidine 4 MG tablet Commonly known as: ZANAFLEX 1 tablet as needed   traMADol 50 MG tablet Commonly known as:  ULTRAM Take 50 mg by mouth daily as needed (for pain).   valsartan 80 MG tablet Commonly known as: DIOVAN TAKE 1 TABLET BY MOUTH DAILY        Allergies:  Allergies  Allergen Reactions   Chicken Allergy Shortness Of Breath, Nausea And Vomiting and Other (See Comments)    Headache SAME REACTION(S) TO [place]!!!!   Dicyclomine Hives   Morphine Shortness Of Breath   Other Anaphylaxis    Ate a fruit salad and had to go to the E.R.   Pineapple Anaphylaxis and Rash   Sulfa Antibiotics Shortness Of Breath    Headache SAME REACTION(S) TO [place]!!!!   Amlodipine Besylate     Other reaction(s): dizziness and edema   Chlorthalidone     Other reaction(s): headache  Naproxen Rash    Other reaction(s): pseudoporphyria    Family History: Family History  Problem Relation Age of Onset   Breast cancer Mother    Lung cancer Father    Colon cancer Neg Hx    Stomach cancer Neg Hx    Esophageal cancer Neg Hx    Pancreatic cancer Neg Hx     Social History:  reports that he has never smoked. He has never used smokeless tobacco. He reports current alcohol use. He reports that he does not use drugs.  ROS: All other review of systems were reviewed and are negative except what is noted above in HPI  Physical Exam: BP 130/62   Pulse 74   Constitutional:  Alert and oriented, No acute distress. HEENT: [place] AT, moist mucus membranes.  Trachea midline, no masses. Cardiovascular: No clubbing, cyanosis, or edema. Respiratory: Normal respiratory effort, no increased work of breathing. GI: Abdomen is soft, nontender, nondistended, no abdominal masses GU: No CVA tenderness.  Lymph: No cervical or inguinal lymphadenopathy. Skin: No rashes, bruises or suspicious lesions. Neurologic: Grossly intact, no focal deficits, moving all 4 extremities. Psychiatric: Normal mood and affect.  Laboratory Data: Lab Results  Component Value Date   WBC 6.2 05/31/2020   HGB 14.6 05/31/2020   HCT 42.9  05/31/2020   MCV 90 05/31/2020   PLT 133 (L) 05/31/2020    Lab Results  Component Value Date   CREATININE 1.04 05/31/2020    No results found for: "PSA"  No results found for: "TESTOSTERONE"  No results found for: "HGBA1C"  Urinalysis    Component Value Date/Time   COLORURINE YELLOW 06/27/2018 1120   APPEARANCEUR Clear 10/02/2021 1136   LABSPEC 1.011 06/27/2018 1120   PHURINE 5.0 06/27/2018 1120   GLUCOSEU Negative 10/02/2021 1136   HGBUR NEGATIVE 06/27/2018 1120   BILIRUBINUR Negative 10/02/2021 1136   [place] 06/27/2018 1120   PROTEINUR Negative 10/02/2021 1136   PROTEINUR NEGATIVE 06/27/2018 1120   NITRITE Negative 10/02/2021 1136   NITRITE NEGATIVE 06/27/2018 1120   LEUKOCYTESUR Negative 10/02/2021 1136   LEUKOCYTESUR NEGATIVE 06/27/2018 1120    Lab Results  Component Value Date   LABMICR See below: 08/24/2021   WBCUA 0-5 08/24/2021   LABEPIT 0-10 08/24/2021   MUCUS Present 08/24/2021   BACTERIA Many (A) 08/24/2021    Pertinent Imaging:  Results for orders placed in visit on 05/14/21  DG Abd 1 View  Narrative CLINICAL DATA:  Kidney stones  EXAM: ABDOMEN - 1 VIEW  COMPARISON:  CT 02/08/2018.  KUB 07/12/2019  FINDINGS: The bowel gas pattern is normal. No radio-opaque calculi or other significant radiographic abnormality are seen. Postoperative changes at the thoracolumbar and lumbosacral junctions.  IMPRESSION: Negative.   Electronically Signed By: Rolm Baptise M.D. On: 05/16/2021 09:08  No results found for this or any previous visit.  No results found for this or any previous visit.  No results found for this or any previous visit.  Results for orders placed during the hospital encounter of 01/30/12  US Renal  Narrative *RADIOLOGY REPORT*  Clinical Data:  Lower back and abdominal pain.  Pyelonephritis. History of kidney stones.  RENAL/URINARY TRACT ULTRASOUND COMPLETE  Comparison:  None.  Findings:  Right  Kidney:  Right kidney is normal in size and echogenicity with no mass or hydronephrosis.  The right kidney measures 11.6 cm.  Left Kidney:  Normal in size and parenchymal echogenicity.  No evidence of mass or hydronephrosis.  The left kidney measures 10.9 cm.  Bladder:  Appears normal for degree of bladder distention.  IMPRESSION: Normal study.   Original Report Authenticated By: Lajean Manes, M.D.  No valid procedures specified. No results found for this or any previous visit.  Results for orders placed in visit on 07/11/21  CT RENAL STONE STUDY  Narrative CLINICAL DATA:  Flank pain  EXAM: CT ABDOMEN AND PELVIS WITHOUT CONTRAST  TECHNIQUE: Multidetector CT imaging of the abdomen and pelvis was performed following the standard protocol without IV contrast.  RADIATION DOSE REDUCTION: This exam was performed according to the departmental dose-optimization program which includes automated exposure control, adjustment of the mA and/or kV according to patient size and/or use of iterative reconstruction technique.  COMPARISON:  CT abdomen and pelvis dated February 08, 2018  FINDINGS: Lower chest: Coronary artery calcifications. Mild bibasilar atelectasis of the partially visualized lung bases.  Hepatobiliary: No focal liver abnormality is seen. No gallstones, gallbladder wall thickening, or biliary dilatation.  Pancreas: Unremarkable. No pancreatic ductal dilatation or surrounding inflammatory changes.  Spleen: Normal in size without focal abnormality.  Adrenals/Urinary Tract: Bilateral adrenal glands are unremarkable. No hydronephrosis or nephrolithiasis. Bladder is unremarkable.  Stomach/Bowel: Stomach is within normal limits. Appendix appears normal. No evidence of bowel wall thickening, distention, or inflammatory changes.  Vascular/Lymphatic: Aortic atherosclerosis. No enlarged abdominal or pelvic lymph nodes.  Reproductive: Mild prostatomegaly.  Other:  No abdominal wall hernia or abnormality. No abdominopelvic ascites.  Musculoskeletal: Posterior rods at T11-T12 and posterior fusion of L5-S1. Unchanged compression deformities at L1 and T12.  IMPRESSION: 1. No acute findings in the abdomen or pelvis including no evidence of hydronephrosis or nephroureterolithiasis. 2.  Aortic Atherosclerosis (ICD10-I70.0).   Electronically Signed By: Yetta Glassman M.D. On: 07/11/2021 10:56   Assessment & Plan:    1. Benign prostatic hyperplasia with urinary obstruction -continue finasteride. Continue urethral dilation once a week.  - BLADDER SCAN AMB NON-IMAGING - Urinalysis, Routine w reflex microscopic  2. Dysuria -resolved  3. Weak urinary stream -resolved   No follow-ups on file.  Nicolette Bang, MD  [facility] [place]

## 2022-01-02 NOTE — Patient Instructions (Signed)
Urethral Stricture ? ?Urethral stricture is narrowing of the tube (urethra) that carries urine from the bladder out of the body. The urethra can become narrow due to scar tissue from an injury or infection. This can make it difficult to pass urine. ?In women, the urethra opens above the vaginal opening. In men, the urethra opens at the tip of the penis, and the urethra is much longer than it is in women. Because of the length of the male urethra, urethral stricture is much more common in men. This condition is treated with surgery. ?What are the causes? ?In both men and women, common causes of urethral stricture include: ?Urinary tract infection (UTI). ?Sexually transmitted infection (STI). ?Use of a tube placed into the urethra to drain urine from the bladder (urinary catheter). ?Urinary tract surgery. ?In men, common causes of urethral stricture include: ?A severe injury to the pelvis. ?Prostate surgery. ?Injury to the penis. ?In many cases, the cause of urethral stricture is not known. ?What increases the risk? ?You are more likely to develop this condition if you: ?Are male. Men who have had prostate surgery are at risk of developing this condition. ?Use a urinary catheter. ?Have had urinary tract surgery. ?What are the signs or symptoms? ?The main symptom of this condition is difficulty passing urine. This may cause decreased urine flow, dribbling, or spraying of urine. Other symptom of this condition may include: ?Frequent UTIs. ?Blood in the urine. ?Pain when urinating. ?Swelling of the penis in men. ?Inability to pass urine (urinary obstruction). ?How is this diagnosed? ?This condition may be diagnosed based on: ?Your medical history and a physical exam. ?Urine tests to check for infection or bleeding. ?X-rays. ?Ultrasound. ?Retrograde urethrogram. This is a type of test in which dye is injected into the urethra and then an X-ray is taken. ?Urethroscopy. This is when a thin tube with a light and camera on  the end (urethroscope) is used to look at the urethra. ?How is this treated? ?This condition is treated with surgery. The type of surgery that you have depends on the severity of your condition. You may have: ?Urethral dilation. In this procedure, the narrow part of the urethra is stretched open (dilated) with dilating instruments or a small balloon. ?Urethrotomy. In this procedure, a urethroscope is placed into the urethra, and the narrow part of the urethra is cut open with a surgical blade inserted through the urethroscope. ?Open surgery. In this procedure, an incision is made in the urethra, the narrow part is removed, and the urethra is reconstructed. ?Follow these instructions at home: ? ?Take over-the-counter and prescription medicines only as told by your health care provider. ?If you were prescribed an antibiotic medicine, take it as told by your health care provider. Do not stop taking the antibiotic even if you start to feel better. ?Drink enough fluid to keep your urine pale yellow. ?Keep all follow-up visits as told by your health care provider. This is important. ?Contact a health care provider if: ?You have signs of a urinary tract infection, such as: ?Frequent urination or passing small amounts of urine frequently. ?Needing to urinate urgently. ?Pain or burning with urination. ?Urine that smells bad or unusual. ?Cloudy urine. ?Pain in the lower abdomen or back. ?Trouble urinating. ?Blood in the urine. ?Vomiting or being less hungry than normal. ?Diarrhea or abdominal pain. ?Vaginal discharge, if you are male. ?Your symptoms are getting worse instead of better. ?Get help right away if: ?You cannot pass urine. ?You have a fever. ?  You have swelling, bruising, or discoloration of your genital area. This includes the penis, scrotum, and inner thighs for men, and the outer genital organs (vulva) and inner thighs for women. ?You develop swelling in your legs. ?You have difficulty  breathing. ?Summary ?Urethral stricture is narrowing of the tube (urethra) that carries urine from the bladder out of the body. The urethra can become narrow due to scar tissue from an injury or infection. ?This condition can make it difficult to pass urine. ?This condition is treated with surgery. The type of surgery that you have depends on the severity of your condition. ?Contact a health care provider if your symptoms get worse or you have signs of a urinary tract infection. ?This information is not intended to replace advice given to you by your health care provider. Make sure you discuss any questions you have with your health care provider. ?Document Revised: 12/12/2020 Document Reviewed: 12/12/2020 ?Elsevier Patient Education ? 2023 Else[employer] ? ?

## 2022-01-03 LAB — URINALYSIS, ROUTINE W REFLEX MICROSCOPIC
Bilirubin, UA: NEGATIVE
Ketones, UA: NEGATIVE
Leukocytes,UA: NEGATIVE
Nitrite, UA: NEGATIVE
Protein,UA: NEGATIVE
RBC, UA: NEGATIVE
Specific Gravity, UA: 1.01 (ref 1.005–1.030)
Urobilinogen, Ur: 0.2 mg/dL (ref 0.2–1.0)
pH, UA: 5.5 (ref 5.0–7.5)

## 2022-01-13 ENCOUNTER — Encounter: Payer: Self-pay | Admitting: Urology

## 2022-01-16 ENCOUNTER — Telehealth: Payer: Self-pay

## 2022-01-16 NOTE — Telephone Encounter (Signed)
Patient called and advised they needed a refill on medication below.   Medication: cyclobenzaprine (FLEXERIL) 10 MG tablet     Pharmacy: [facility], [place] - [phone]       Thank you

## 2022-01-25 ENCOUNTER — Other Ambulatory Visit: Payer: Self-pay | Admitting: Urology

## 2022-04-25 ENCOUNTER — Ambulatory Visit: Payer: Medicare Other | Admitting: Physician Assistant

## 2022-04-28 NOTE — Progress Notes (Unsigned)
Cardiology Office Note:    Date:  05/02/2022   ID:  [name], DOB 12-04-1950, MRN [id number]  PCP:  Lavone Orn, MD   [facility] Providers Cardiologist:  Candee Furbish, MD     Referring MD: Lavone Orn, MD   Chief Complaint: fatigue  History of Present Illness:    [name] is a very pleasant 73 y.o. male with a hx of CAD s/p DES to LAD 2010, DES to mLAD and pullback to D2 in 04/2020, HTN, diabetes, anxiety, lumbar disc disease, and HLD.   Seen by Dr. Marlou Porch 04/2020 and BP was soft. He was feeling washed out. Isorsorbide was d/c and he felt better. No chest pain. 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   Last cardiology clinic visit was 09/07/20 with Dr. Marlou Porch. He was continuing with clopidogrel and aspirin until 10/2020, likely clopidogrel monotherapy after that. No additional changes were instituted and 6 month follow-up was recommended.  Today, he is here with his wife for overdue follow-up. Reports he has recently noted increased fatigue. 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 Works 10 hour days 5 1/2 to 6 days per week. Has had a URI for the past week with congestion. Prior to getting URI, noted some shortness of breath with pushing heavy race equipment. Can walk, do work at times and not feel any shortness of breath. Has chest pain that occurs at random occasions, is not worse with exertion. Has felt lightheaded on occasion, no syncope. No palpitations, orthopnea, edema, or PND. Prior symptom of angina was "giving out of energy."  Admits he goes long periods of the day without eating due to dizziness. Reports that he hydrates well.  Past Medical History:  Diagnosis Date   Anxiety    CAD (coronary artery disease)    s/p Taxus DES to LAD in 2010 // Myoview 09/2018: low risk, normal EF    DM2 (diabetes mellitus, type 2) (HCC)    Hx of seasonal allergies    Hyperlipidemia    Hypertension     Past Surgical History:  Procedure Laterality  Date   ANGIOPLASTY  2010   stent   CORONARY STENT INTERVENTION N/A 05/16/2020   Procedure: CORONARY STENT INTERVENTION;  Surgeon: Jettie Booze, MD;  Location: [place] [facility];  Service: Cardiovascular;  Laterality: N/A;   INTRAVASCULAR PRESSURE WIRE/FFR STUDY N/A 05/16/2020   Procedure: INTRAVASCULAR PRESSURE WIRE/FFR STUDY;  Surgeon: Jettie Booze, MD;  Location: [place] [facility];  Service: Cardiovascular;  Laterality: N/A;   INTRAVASCULAR ULTRASOUND/IVUS N/A 05/16/2020   Procedure: Intravascular Ultrasound/IVUS;  Surgeon: Jettie Booze, MD;  Location: [place] [facility];  Service: Cardiovascular;  Laterality: N/A;   LEFT HEART CATH AND CORONARY ANGIOGRAPHY N/A 05/16/2020   Procedure: LEFT HEART CATH AND CORONARY ANGIOGRAPHY;  Surgeon: Jettie Booze, MD;  Location: [place] [facility];  Service: Cardiovascular;  Laterality: N/A;   LUMBAR LAMINECTOMY  1991   L3 and T12    Current Medications: Current Meds  Medication Sig   acetaminophen (TYLENOL) 500 MG tablet Take 1,000 mg by mouth every 8 (eight) hours as needed for moderate pain.    amLODipine (NORVASC) 5 MG tablet Take 5 mg by mouth daily.   aspirin EC 81 MG tablet Take 81 mg by mouth daily.   atorvastatin (LIPITOR) 80 MG tablet Take 1 tablet (80 mg total) by mouth daily.   Biotin 5000 MCG TABS 1 tablet  citalopram (CELEXA) 10 MG tablet Take 10 mg by mouth daily.   clopidogrel (PLAVIX) 75 MG tablet Take 1 tablet by mouth once daily   Coenzyme Q10 (CO Q 10) 100 MG CAPS Take 100 mg by mouth daily.   diphenhydramine-acetaminophen (TYLENOL PM) 25-500 MG TABS tablet Take 1 tablet by mouth at bedtime.   finasteride (PROSCAR) 5 MG tablet Take 1 tablet (5 mg total) by mouth daily.   fluorouracil (EFUDEX) 5 % cream SMARTSIG:sparingly Topical Twice Daily   fluticasone (FLONASE) 50 MCG/ACT nasal spray Place 1 spray into both nostrils as needed for allergies.   gabapentin (NEURONTIN) 300 MG capsule Take 600 mg by  mouth at bedtime. And may take an additional 300 mg once a day if needed   HYDROcodone-acetaminophen (NORCO) 5-325 MG tablet Take 1 tablet by mouth every 6 (six) hours as needed for moderate pain.   metFORMIN (GLUCOPHAGE-XR) 500 MG 24 hr tablet Take 500 mg by mouth daily.   pantoprazole (PROTONIX) 40 MG tablet Take 40 mg by mouth as needed (for acid reflux).   Saw Palmetto, Serenoa repens, (SAW PALMETTO PO) Take 100 mg by mouth 2 (two) times daily.   tamsulosin (FLOMAX) 0.4 MG CAPS capsule Take 1 capsule by mouth at bedtime   traMADol (ULTRAM) 50 MG tablet Take by mouth as needed.   valsartan (DIOVAN) 80 MG tablet TAKE 1 TABLET BY MOUTH DAILY   vitamin B-12 (CYANOCOBALAMIN) 1000 MCG tablet Take 1,000 mcg by mouth 3 (three) times a week.   [DISCONTINUED] amoxicillin-clavulanate (AUGMENTIN) 875-125 MG tablet Take 1 tablet by mouth every 12 (twelve) hours.   [DISCONTINUED] Biotin 10000 MCG TABS Take 1,000 mg by mouth daily.   [DISCONTINUED] cefpodoxime (VANTIN) 200 MG tablet Take 1 tablet (200 mg total) by mouth 2 (two) times daily.   [DISCONTINUED] cyclobenzaprine (FLEXERIL) 10 MG tablet Take 1 tablet by mouth three times daily as needed for muscle spasm   [DISCONTINUED] doxycycline (VIBRAMYCIN) 100 MG capsule Take 1 capsule (100 mg total) by mouth every 12 (twelve) hours.   [DISCONTINUED] fluconazole (DIFLUCAN) 100 MG tablet Take 1 tablet (100 mg total) by mouth daily. X 7 days   [DISCONTINUED] fluconazole (DIFLUCAN) 150 MG tablet Take 1 tablet (150 mg total) by mouth daily. 150 mg by mouth daily x 2 days.   [DISCONTINUED] mupirocin ointment (BACTROBAN) 2 % Apply 1 application. topically 3 (three) times daily.   [DISCONTINUED] nitrofurantoin (MACRODANTIN) 100 MG capsule Take 1 capsule (100 mg total) by mouth 2 (two) times daily.   [DISCONTINUED] nitrofurantoin, macrocrystal-monohydrate, (MACROBID) 100 MG capsule Take 1 capsule by mouth twice daily   [DISCONTINUED] tiZANidine (ZANAFLEX) 4 MG tablet  1 tablet as needed   [DISCONTINUED] traMADol (ULTRAM) 50 MG tablet Take 50 mg by mouth daily as needed (for pain).    Current Facility-Administered Medications for the 05/02/22 encounter (Office Visit) with Ann Maki, Lanice Schwab, NP  Medication   0.9 %  sodium chloride infusion   ciprofloxacin (CIPRO) tablet 500 mg     Allergies:   Chicken allergy, Dicyclomine, Morphine, Other, Pineapple, Sulfa antibiotics, Amlodipine besylate, Chlorthalidone, and Naproxen   Social History   Socioeconomic History   Marital status: Married    Spouse name: Not on file   Number of children: Not on file   Years of education: Not on file   Highest education level: Not on file  Occupational History   Not on file  Tobacco Use   Smoking status: Never   Smokeless tobacco: Never  Substance and  Sexual Activity   Alcohol use: Yes    Comment: beer once or twice a month   Drug use: No   Sexual activity: Not on file  Other Topics Concern   Not on file  Social History Narrative   Not on file   Social Determinants of Health   Financial Resource Strain: Not on file  Food Insecurity: Not on file  Transportation Needs: Not on file  Physical Activity: Not on file  Stress: Not on file  Social Connections: Not on file     Family History: The patient's family history includes Breast cancer in his mother; Lung cancer in his father. There is no history of Colon cancer, Stomach cancer, Esophageal cancer, or Pancreatic cancer.  ROS:   Please see the history of present illness.    + fatigue All other systems reviewed and are negative.  Labs/Other Studies Reviewed:    The following studies were reviewed today:  LEFT HEART CATH  05/16/2020 Narrative  Prox LAD to Mid LAD lesion is 25% stenosed- instent restenosis of the prior stent.  Mid LAD lesion is 75% stenosed. This was significant by RFR, 0.88.  A drug-eluting stent was successfully placed using a STENT RESOLUTE ONYX 3.5X34, postdilated to 3.75 in  overlapping the prior stent. This was optimized with intravascular ultrasound.  Post intervention, there is a 0% residual stenosis.  2nd Diag lesion is 75% stenosed.  Balloon angioplasty was performed using a BALLOON SAPPHIRE 2.0X12.  Post intervention, there is a 25% residual stenosis.  The left ventricular systolic function is normal.  LV end diastolic pressure is normal.  There is no aortic valve stenosis. Continue aggressive secondary prevention.  Plan for clopidogrel and aspirin for at least 6 months.  Consider clopidogrel monotherapy going forward after 6 months. Plan for same-day discharge.   Myoview 10/08/2018 EF 64, normal perfusion; low risk   Cardiac catheterization 07/12/2008 LM normal LAD proximal 95, mid 50-60; D1 ostial 50 LCx normal RCA mid 40-50 EF 60 PCI: Taxus DES to the proximal LAD   Recent Labs: No results found for requested labs within last 365 days.  Recent Lipid Panel    Component Value Date/Time   CHOL 150 10/31/2015 1137   TRIG 201 (H) 10/31/2015 1137   HDL 40 (L) 10/31/2015 1137   CHOLHDL 3.8 10/31/2015 1137   VLDL 40 10/31/2015 1137   LDLCALC 70 10/31/2015 1137     Risk Assessment/Calculations:       Physical Exam:    VS:  BP 120/70   Pulse (!) 51   Temp 98.8 F (37.1 C) (Oral)   Ht '5\' 11"'$  (1.803 m)   Wt 182 lb 12.8 oz (82.9 kg)   SpO2 94%   BMI 25.50 kg/m     Wt Readings from Last 3 Encounters:  05/02/22 182 lb 12.8 oz (82.9 kg)  09/07/20 177 lb (80.3 kg)  05/31/20 183 lb (83 kg)     GEN:  Well nourished, well developed in no acute distress HEENT: Normal NECK: No JVD; No carotid bruits CARDIAC: RRR, no murmurs, rubs, gallops RESPIRATORY:  Clear to auscultation without rales, wheezing or rhonchi  ABDOMEN: Soft, non-tender, non-distended MUSCULOSKELETAL:  No edema; No deformity. 2+ pedal pulses, equal bilaterally SKIN: Warm and dry NEUROLOGIC:  Alert and oriented x 3 PSYCHIATRIC:  Normal affect   EKG:  EKG is  ordered today.  The ekg ordered today demonstrates sinus bradycardia at 51 bpm, no ST abnormality   Diagnoses:    1. Coronary  artery disease involving native coronary artery of native heart with other form of angina pectoris ([place])   2. Essential hypertension   3. Hyperlipidemia LDL goal <70   4. Sinus bradycardia   5. Other fatigue    Assessment and Plan:     Fatigue: Increased fatigue over the past several weeks. Also has URI currently, but noted some symptoms of fatigue prior to illness. Will check TSH, CBC, CMET for potential causes of fatigue. HR 51 bpm. He does not monitor at home.  Occasional lightheadedness, no syncope.  Not on BB.  We will place a 7-day Zio patch monitor for evaluation of significant bradycardia or pauses.   CAD with ? angina: S/p DES to LAD 2010. Low risk myoview 09/2018. Most recent cardiac cath with DES to mLAD and POBA to D2 in 04/2020. Unfortunately, he has not maintained consistent follow-up and has stopped both Plavix and aspirin. Will forward note to Dr. Marlou Porch on his preference for APT from this point forward. Symptoms of fatigue somewhat similar to prior coronary intervention. Question if angina equivalent. Symptoms of chest pain and SOB are not worsened with exertion. As noted above, additional causes of fatigue considered. Will monitor monitor and lab results and if unremarkable, will consider cardiac PET scan for worsening ischemia. Continue atorvastatin, valsartan.   Sinus bradycardia: HR 51 bpm on EKG. Does not monitor resting HR consistently.  Has been having some episodes of lightheadedness and fatigue. He is not on any AV nodal blocking agents. Will place 7-day monitor for evaluation.   Hyperlipidemia LDL goal < 70: LDL 65, triglycerides 127, HDL 47 on 06/27/2021. We will go ahead and recheck today. Continue atorvastatin.   Disposition: 2 months with me  Medication Adjustments/Labs and Tests Ordered: Current medicines are reviewed at length with the  patient today.  Concerns regarding medicines are outlined above.  Orders Placed This Encounter  Procedures   CBC   TSH   Comp Met (CMET)   Lipid Profile   LONG TERM MONITOR (3-14 DAYS)   EKG 12-Lead   No orders of the defined types were placed in this encounter.   Patient Instructions  Medication Instructions:   Your physician recommends that you continue on your current medications as directed. Please refer to the Current Medication list given to you today.   *If you need a refill on your cardiac medications before your next appointment, please call your pharmacy*   Lab Work:  TODAY!!!! CBC/TSH/LIPID/CMET  If you have labs (blood work) drawn today and your tests are completely normal, you will receive your results only by: [place] (if you have MyChart) OR A paper copy in the mail If you have any lab test that is abnormal or we need to change your treatment, we will call you to review the results.   Testing/Procedures:   Bryn Gulling- Long Term Monitor Instructions  Your physician has requested you wear a ZIO patch monitor for 14 days.  This is a single patch monitor. [employer] supplies one patch monitor per enrollment. Additional stickers are not available. Please do not apply patch if you will be having a Nuclear Stress Test,  Echocardiogram, Cardiac CT, MRI, or Chest Xray during the period you would be wearing the  monitor. The patch cannot be worn during these tests. You cannot remove and re-apply the  ZIO XT patch monitor.  Your ZIO patch monitor will be mailed 3 day USPS to your address on file. It may take 3-5 days  to receive  your monitor after you have been enrolled.  Once you have received your monitor, please review the enclosed instructions. Your monitor  has already been registered assigning a specific monitor serial # to you.  Billing and Patient Assistance Program Information  We have supplied [employer] with any of your insurance information on file for  billing purposes. [employer] offers a sliding scale Patient Assistance Program for patients that do not have  insurance, or whose insurance does not completely cover the cost of the ZIO monitor.  You must apply for the Patient Assistance Program to qualify for this discounted rate.  To apply, please call [employer] at [phone], select option 4, select option 2, ask to apply for  Patient Assistance Program. Theodore Demark will ask your household income, and how many people  are in your household. They will quote your out-of-pocket cost based on that information.  [employer] will also be able to set up a 49-month interest-free payment plan if needed.  Applying the monitor   Shave hair from upper left chest.  Hold abrader disc by orange tab. Rub abrader in 40 strokes over the upper left chest as  indicated in your monitor instructions.  Clean area with 4 enclosed alcohol pads. Let dry.  Apply patch as indicated in monitor instructions. Patch will be placed under collarbone on left  side of chest with arrow pointing upward.  Rub patch adhesive wings for 2 minutes. Remove white label marked "1". Remove the white  label marked "2". Rub patch adhesive wings for 2 additional minutes.  While looking in a mirror, press and release button in center of patch. A small green light will  flash 3-4 times. This will be your only indicator that the monitor has been turned on.  Do not shower for the first 24 hours. You may shower after the first 24 hours.  Press the button if you feel a symptom. You will hear a small click. Record Date, Time and  Symptom in the Patient Logbook.  When you are ready to remove the patch, follow instructions on the last 2 pages of Patient  Logbook. Stick patch monitor onto the last page of Patient Logbook.  Place Patient Logbook in the blue and white box. Use locking tab on box and tape box closed  securely. The blue and white box has prepaid postage on it. Please place it in the mailbox  as  soon as possible. Your physician should have your test results approximately 7 days after the  monitor has been [facility]  Call IEverlyat [phone] you have questions regarding  your ZIO XT patch monitor. Call them immediately if you see an orange light blinking on your  monitor.  If your monitor falls off in less than 4 days, contact our Monitor department at [phone]  If your monitor becomes loose or falls off after 4 days call [employer] at [phone]  suggestions on securing your monitor    Follow-Up: [facility] you and your health needs are our priority.  As part of our continuing mission to provide you with exceptional heart care, we have created designated Provider Care Teams.  These Care Teams include your primary Cardiologist (physician) and Advanced Practice Providers (APPs -  Physician Assistants and Nurse Practitioners) who all work together to provide you with the care you need, when you need it.  We recommend signing up for the patient portal called "MyChart".  Sign up information is provided on this  After Visit Summary.  MyChart is used to connect with patients for Virtual Visits (Telemedicine).  Patients are able to view lab/test results, encounter notes, upcoming appointments, etc.  Non-urgent messages can be sent to your provider as well.   To learn more about what you can do with MyChart, go to [email or web address].    Your next appointment:   2 month(s)  Provider:   Christen Bame, NP            Signed, Emmaline Life, NP  05/02/2022 12:53 PM    [place]

## 2022-05-02 ENCOUNTER — Ambulatory Visit (INDEPENDENT_AMBULATORY_CARE_PROVIDER_SITE_OTHER): Payer: Medicare Other

## 2022-05-02 ENCOUNTER — Ambulatory Visit: Payer: Medicare Other | Attending: Physician Assistant | Admitting: Nurse Practitioner

## 2022-05-02 ENCOUNTER — Encounter: Payer: Self-pay | Admitting: Nurse Practitioner

## 2022-05-02 VITALS — BP 120/70 | HR 51 | Temp 98.8°F | Ht 71.0 in | Wt 182.8 lb

## 2022-05-02 DIAGNOSIS — I10 Essential (primary) hypertension: Secondary | ICD-10-CM

## 2022-05-02 DIAGNOSIS — R001 Bradycardia, unspecified: Secondary | ICD-10-CM

## 2022-05-02 DIAGNOSIS — I25118 Atherosclerotic heart disease of native coronary artery with other forms of angina pectoris: Secondary | ICD-10-CM

## 2022-05-02 DIAGNOSIS — R5383 Other fatigue: Secondary | ICD-10-CM | POA: Diagnosis not present

## 2022-05-02 DIAGNOSIS — E785 Hyperlipidemia, unspecified: Secondary | ICD-10-CM

## 2022-05-02 DIAGNOSIS — I2510 Atherosclerotic heart disease of native coronary artery without angina pectoris: Secondary | ICD-10-CM | POA: Diagnosis not present

## 2022-05-02 LAB — CBC

## 2022-05-02 NOTE — Progress Notes (Unsigned)
Applied a 7 day Zio XT monitor to patient in the office  Dr Marlou Porch to read

## 2022-05-02 NOTE — Patient Instructions (Signed)
Medication Instructions:   Your physician recommends that you continue on your current medications as directed. Please refer to the Current Medication list given to you today.   *If you need a refill on your cardiac medications before your next appointment, please call your pharmacy*   Lab Work:  TODAY!!!! CBC/TSH/LIPID/CMET  If you have labs (blood work) drawn today and your tests are completely normal, you will receive your results only by: [place] (if you have MyChart) OR A paper copy in the mail If you have any lab test that is abnormal or we need to change your treatment, we will call you to review the results.   Testing/Procedures:   Bryn Gulling- Long Term Monitor Instructions  Your physician has requested you wear a ZIO patch monitor for 14 days.  This is a single patch monitor. [employer] supplies one patch monitor per enrollment. Additional stickers are not available. Please do not apply patch if you will be having a Nuclear Stress Test,  Echocardiogram, Cardiac CT, MRI, or Chest Xray during the period you would be wearing the  monitor. The patch cannot be worn during these tests. You cannot remove and re-apply the  ZIO XT patch monitor.  Your ZIO patch monitor will be mailed 3 day USPS to your address on file. It may take 3-5 days  to receive your monitor after you have been enrolled.  Once you have received your monitor, please review the enclosed instructions. Your monitor  has already been registered assigning a specific monitor serial # to you.  Billing and Patient Assistance Program Information  We have supplied [employer] with any of your insurance information on file for billing purposes. [employer] offers a sliding scale Patient Assistance Program for patients that do not have  insurance, or whose insurance does not completely cover the cost of the ZIO monitor.  You must apply for the Patient Assistance Program to qualify for this discounted rate.  To apply, please  call [employer] at [phone], select option 4, select option 2, ask to apply for  Patient Assistance Program. Theodore Demark will ask your household income, and how many people  are in your household. They will quote your out-of-pocket cost based on that information.  [employer] will also be able to set up a 79-month interest-free payment plan if needed.  Applying the monitor   Shave hair from upper left chest.  Hold abrader disc by orange tab. Rub abrader in 40 strokes over the upper left chest as  indicated in your monitor instructions.  Clean area with 4 enclosed alcohol pads. Let dry.  Apply patch as indicated in monitor instructions. Patch will be placed under collarbone on left  side of chest with arrow pointing upward.  Rub patch adhesive wings for 2 minutes. Remove white label marked "1". Remove the white  label marked "2". Rub patch adhesive wings for 2 additional minutes.  While looking in a mirror, press and release button in center of patch. A small green light will  flash 3-4 times. This will be your only indicator that the monitor has been turned on.  Do not shower for the first 24 hours. You may shower after the first 24 hours.  Press the button if you feel a symptom. You will hear a small click. Record Date, Time and  Symptom in the Patient Logbook.  When you are ready to remove the patch, follow instructions on the last 2 pages of Patient  Logbook. Stick patch monitor onto the last page of  Patient Logbook.  Place Patient Logbook in the blue and white box. Use locking tab on box and tape box closed  securely. The blue and white box has prepaid postage on it. Please place it in the mailbox as  soon as possible. Your physician should have your test results approximately 7 days after the  monitor has been mailed back to [facility].  Call [place] at [phone] if you have questions regarding  your ZIO XT patch monitor. Call them immediately if you see an  orange light blinking on your  monitor.  If your monitor falls off in less than 4 days, contact our Monitor department at [phone].  If your monitor becomes loose or falls off after 4 days call [employer] at [phone] for  suggestions on securing your monitor    Follow-Up: At [facility], you and your health needs are our priority.  As part of our continuing mission to provide you with exceptional heart care, we have created designated Provider Care Teams.  These Care Teams include your primary Cardiologist (physician) and Advanced Practice Providers (APPs -  Physician Assistants and Nurse Practitioners) who all work together to provide you with the care you need, when you need it.  We recommend signing up for the patient portal called "MyChart".  Sign up information is provided on this After Visit Summary.  MyChart is used to connect with patients for Virtual Visits (Telemedicine).  Patients are able to view lab/test results, encounter notes, upcoming appointments, etc.  Non-urgent messages can be sent to your provider as well.   To learn more about what you can do with MyChart, go to [email or web address].    Your next appointment:   2 month(s)  Provider:   Christen Bame, NP

## 2022-05-03 ENCOUNTER — Other Ambulatory Visit: Payer: Self-pay | Admitting: *Deleted

## 2022-05-03 ENCOUNTER — Telehealth: Payer: Self-pay | Admitting: *Deleted

## 2022-05-03 DIAGNOSIS — R5383 Other fatigue: Secondary | ICD-10-CM

## 2022-05-03 LAB — COMPREHENSIVE METABOLIC PANEL
ALT: 25 IU/L (ref 0–44)
AST: 22 IU/L (ref 0–40)
Albumin/Globulin Ratio: 2.2 (ref 1.2–2.2)
Albumin: 4.2 g/dL (ref 3.8–4.8)
Alkaline Phosphatase: 69 IU/L (ref 44–121)
BUN/Creatinine Ratio: 18 (ref 10–24)
BUN: 20 mg/dL (ref 8–27)
Bilirubin Total: 0.6 mg/dL (ref 0.0–1.2)
CO2: 25 mmol/L (ref 20–29)
Calcium: 9.1 mg/dL (ref 8.6–10.2)
Chloride: 102 mmol/L (ref 96–106)
Creatinine, Ser: 1.09 mg/dL (ref 0.76–1.27)
Globulin, Total: 1.9 g/dL (ref 1.5–4.5)
Glucose: 143 mg/dL — ABNORMAL HIGH (ref 70–99)
Potassium: 4.3 mmol/L (ref 3.5–5.2)
Sodium: 139 mmol/L (ref 134–144)
Total Protein: 6.1 g/dL (ref 6.0–8.5)
eGFR: 71 mL/min/{1.73_m2} (ref >59)

## 2022-05-03 LAB — CBC
Hematocrit: 41.2 % (ref 37.5–51.0)
Hemoglobin: 14.5 g/dL (ref 13.0–17.7)
MCH: 32.2 pg (ref 26.6–33.0)
MCHC: 35.2 g/dL (ref 31.5–35.7)
MCV: 91 fL (ref 79–97)
RBC: 4.51 x10E6/uL (ref 4.14–5.80)
RDW: 13.8 % (ref 11.6–15.4)
WBC: 4.5 10*3/uL (ref 3.4–10.8)

## 2022-05-03 LAB — LIPID PANEL
Chol/HDL Ratio: 2.9 ratio (ref 0.0–5.0)
Cholesterol, Total: 106 mg/dL (ref 100–199)
HDL: 36 mg/dL — ABNORMAL LOW (ref >39)
LDL Chol Calc (NIH): 55 mg/dL (ref 0–99)
Triglycerides: 68 mg/dL (ref 0–149)
VLDL Cholesterol Cal: 15 mg/dL (ref 5–40)

## 2022-05-03 LAB — TSH: TSH: 0.78 u[IU]/mL (ref 0.450–4.500)

## 2022-05-03 NOTE — Telephone Encounter (Signed)
S/w pt's spouse per [facility]) is aware platelet count clotted and could not get reading on lab.  Pt will have to repeat.  Pt is not feeling well and will try to go Monday to [place] office to get platelet drawn. Orders placed in system and released.

## 2022-05-04 LAB — PLATELET COUNT: Platelets: 99 10*3/uL — CL (ref 150–450)

## 2022-05-07 DIAGNOSIS — B349 Viral infection, unspecified: Secondary | ICD-10-CM | POA: Diagnosis not present

## 2022-05-07 DIAGNOSIS — R001 Bradycardia, unspecified: Secondary | ICD-10-CM | POA: Diagnosis not present

## 2022-05-07 DIAGNOSIS — D696 Thrombocytopenia, unspecified: Secondary | ICD-10-CM | POA: Diagnosis not present

## 2022-05-07 DIAGNOSIS — I700 Atherosclerosis of aorta: Secondary | ICD-10-CM | POA: Diagnosis not present

## 2022-05-07 DIAGNOSIS — E119 Type 2 diabetes mellitus without complications: Secondary | ICD-10-CM | POA: Diagnosis not present

## 2022-05-17 DIAGNOSIS — R001 Bradycardia, unspecified: Secondary | ICD-10-CM | POA: Diagnosis not present

## 2022-05-17 DIAGNOSIS — I2510 Atherosclerotic heart disease of native coronary artery without angina pectoris: Secondary | ICD-10-CM | POA: Diagnosis not present

## 2022-05-24 DIAGNOSIS — I2510 Atherosclerotic heart disease of native coronary artery without angina pectoris: Secondary | ICD-10-CM | POA: Diagnosis not present

## 2022-05-24 DIAGNOSIS — E785 Hyperlipidemia, unspecified: Secondary | ICD-10-CM | POA: Diagnosis not present

## 2022-05-24 DIAGNOSIS — R001 Bradycardia, unspecified: Secondary | ICD-10-CM | POA: Diagnosis not present

## 2022-05-24 DIAGNOSIS — I10 Essential (primary) hypertension: Secondary | ICD-10-CM | POA: Diagnosis not present

## 2022-06-27 ENCOUNTER — Encounter: Payer: Self-pay | Admitting: Nurse Practitioner

## 2022-06-27 ENCOUNTER — Ambulatory Visit: Payer: Medicare Other | Attending: Nurse Practitioner | Admitting: Nurse Practitioner

## 2022-06-27 VITALS — BP 112/60 | HR 55 | Ht 71.0 in | Wt 183.4 lb

## 2022-06-27 DIAGNOSIS — R011 Cardiac murmur, unspecified: Secondary | ICD-10-CM | POA: Diagnosis not present

## 2022-06-27 DIAGNOSIS — D696 Thrombocytopenia, unspecified: Secondary | ICD-10-CM | POA: Diagnosis not present

## 2022-06-27 DIAGNOSIS — I2510 Atherosclerotic heart disease of native coronary artery without angina pectoris: Secondary | ICD-10-CM | POA: Diagnosis not present

## 2022-06-27 DIAGNOSIS — R001 Bradycardia, unspecified: Secondary | ICD-10-CM | POA: Diagnosis not present

## 2022-06-27 DIAGNOSIS — I10 Essential (primary) hypertension: Secondary | ICD-10-CM

## 2022-06-27 DIAGNOSIS — E785 Hyperlipidemia, unspecified: Secondary | ICD-10-CM

## 2022-06-27 NOTE — Progress Notes (Signed)
Cardiology Office Note:    Date:  06/27/2022   ID:  [name], DOB February 18, 1951, MRN [id number]  PCP:  Austin Funk, MD   [facility] Providers Cardiologist:  Austin Schultz, MD     Referring MD: Austin Funk, MD   Chief Complaint: fatigue  History of Present Illness:    [name] is a very pleasant 72 y.o. male with a hx of CAD s/p DES to LAD 2010, DES to mLAD and pullback to D2 in 04/2020, HTN, diabetes, anxiety, lumbar disc disease, and HLD.   Seen by [name] 04/2020 and BP was soft. He was feeling washed out. Isorsorbide was d/c and he felt better. No chest pain. 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   Last cardiology clinic visit was 09/07/20 with [name]. He was continuing with clopidogrel and aspirin until 10/2020, likely clopidogrel monotherapy after that. No additional changes were instituted and 6 month follow-up was recommended.  Seen in clinic by me on 05/02/22 for overdue follow-up, accompanied by his wife. Reported increased fatigue. 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 Works 10 hour days 5 1/2 to 6 days per week. Had URI for the past week with congestion. Prior to getting URI, noted some shortness of breath with pushing heavy race equipment. Can walk, do work at times and not feel any shortness of breath. Has chest pain that occurs at random occasions, is not worse with exertion. Has felt lightheaded on occasion, no syncope. No palpitations, orthopnea, edema, or PND. Prior symptom of angina was "giving out of energy."  Admits he goes long periods of the day without eating due to dizziness. Reports that he hydrates well. On lab work at that visit, he was found to have mild thrombocytopenia with platelets 99,000. Other labs including TSH and CMET were unremarkable.  7-day Zio patch monitor was placed for evaluation of fatigue and completed on 05/24/2022.  Monitor revealed predominant underlying rhythm sinus rhythm with no significant pauses.   Minimum heart rate 38 with average HR 60 bpm.  Slower HR during hours of sleep.  No pauses or AV block, no atrial fibrillation or flutter, brief atrial tachycardia lasting 14 beats, rare PAC/PVC.  Today, he is here for follow-up and reports he is feeling well. No further episodes of fatigue, it resolved shortly after last office visit.  CBC was obtained at that office visit and revealed low platelet count. He was advised to follow-up with PCP which he did.  Reports he has overall been feeling well. Just returned from 3 weeks of [place] car racing in [place].very physically active during these races as well as he continues to maintain a farm at home. Sometimes during the races, he goes long periods without eating and drinking and he does feel fatigued with this, but otherwise is feeling well.  He denies shortness of breath, chest pain, palpitations, orthopnea, PND, edema, presyncope, syncope.  No bleeding concerns.   Past Medical History:  Diagnosis Date   Anxiety    CAD (coronary artery disease)    s/p Taxus DES to LAD in 2010 // Myoview 09/2018: low risk, normal EF    DM2 (diabetes mellitus, type 2) (HCC)    Hx of seasonal allergies    Hyperlipidemia    Hypertension     Past Surgical History:  Procedure Laterality Date   ANGIOPLASTY  2010   stent   CORONARY PRESSURE/FFR STUDY N/A 05/16/2020   Procedure: INTRAVASCULAR PRESSURE WIRE/FFR  STUDY;  Surgeon: Corky Crafts, MD;  Location: [facility] INVASIVE CV LAB;  Service: Cardiovascular;  Laterality: N/A;   CORONARY STENT INTERVENTION N/A 05/16/2020   Procedure: CORONARY STENT INTERVENTION;  Surgeon: Corky Crafts, MD;  Location: [facility] INVASIVE CV LAB;  Service: Cardiovascular;  Laterality: N/A;   CORONARY ULTRASOUND/IVUS N/A 05/16/2020   Procedure: Intravascular Ultrasound/IVUS;  Surgeon: Corky Crafts, MD;  Location: [facility] INVASIVE CV LAB;  Service: Cardiovascular;  Laterality: N/A;   LEFT HEART CATH AND CORONARY ANGIOGRAPHY N/A 05/16/2020    Procedure: LEFT HEART CATH AND CORONARY ANGIOGRAPHY;  Surgeon: Corky Crafts, MD;  Location: [facility] INVASIVE CV LAB;  Service: Cardiovascular;  Laterality: N/A;   LUMBAR LAMINECTOMY  1991   L3 and T12    Current Medications: Current Meds  Medication Sig   acetaminophen (TYLENOL) 500 MG tablet Take 1,000 mg by mouth every 8 (eight) hours as needed for moderate pain.    amLODipine (NORVASC) 5 MG tablet Take 5 mg by mouth daily.   aspirin EC 81 MG tablet Take 81 mg by mouth daily.   atorvastatin (LIPITOR) 80 MG tablet Take 1 tablet (80 mg total) by mouth daily.   Biotin 5000 MCG TABS 1 tablet   citalopram (CELEXA) 10 MG tablet Take 10 mg by mouth daily.   Coenzyme Q10 (CO Q 10) 100 MG CAPS Take 100 mg by mouth daily.   fluorouracil (EFUDEX) 5 % cream SMARTSIG:sparingly Topical Twice Daily   fluticasone (FLONASE) 50 MCG/ACT nasal spray Place 1 spray into both nostrils as needed for allergies.   gabapentin (NEURONTIN) 300 MG capsule Take 600 mg by mouth at bedtime. And may take an additional 300 mg once a day if needed   metFORMIN (GLUCOPHAGE-XR) 500 MG 24 hr tablet Take 500 mg by mouth daily.   pantoprazole (PROTONIX) 40 MG tablet Take 40 mg by mouth as needed (for acid reflux).   Saw Palmetto, Serenoa repens, (SAW PALMETTO PO) Take 100 mg by mouth 2 (two) times daily.   tamsulosin (FLOMAX) 0.4 MG CAPS capsule Take 1 capsule by mouth at bedtime   traMADol (ULTRAM) 50 MG tablet Take by mouth as needed.   valsartan (DIOVAN) 80 MG tablet TAKE 1 TABLET BY MOUTH DAILY   vitamin B-12 (CYANOCOBALAMIN) 1000 MCG tablet Take 1,000 mcg by mouth 3 (three) times a week.   [DISCONTINUED] clopidogrel (PLAVIX) 75 MG tablet Take 1 tablet by mouth once daily   Current Facility-Administered Medications for the 06/27/22 encounter (Office Visit) with [name], Austin George, NP  Medication   0.9 %  sodium chloride infusion   ciprofloxacin (CIPRO) tablet 500 mg     Allergies:   Chicken allergy, Dicyclomine,  Morphine, Other, Pineapple, Sulfa antibiotics, Amlodipine besylate, Chlorthalidone, and Naproxen   Social History   Socioeconomic History   Marital status: Married    Spouse name: Not on file   Number of children: Not on file   Years of education: Not on file   Highest education level: Not on file  Occupational History   Not on file  Tobacco Use   Smoking status: Never   Smokeless tobacco: Never  Substance and Sexual Activity   Alcohol use: Yes    Comment: beer once or twice a month   Drug use: No   Sexual activity: Not on file  Other Topics Concern   Not on file  Social History Narrative   Not on file   Social Determinants of Health   Financial Resource Strain: Not on  file  Food Insecurity: Not on file  Transportation Needs: Not on file  Physical Activity: Not on file  Stress: Not on file  Social Connections: Not on file     Family History: The patient's family history includes Breast cancer in his mother; Lung cancer in his father. There is no history of Colon cancer, Stomach cancer, Esophageal cancer, or Pancreatic cancer.  ROS:   Please see the history of present illness.  All other systems reviewed and are negative.  Labs/Other Studies Reviewed:    The following studies were reviewed today:  Cardiac Monitor 05/24/22   Predominant underlying rhythm was Sinus Rhythm avg. HR 60 bpm with isolated brief junctional rhythm (86 bpm)   Brief atrial tachycardia   Rare PAC/PVC   No atrial fibrillation or flutter   Benign min HR during sleep.   No pauses. No AV block.   LEFT HEART CATH  05/16/2020 Narrative  Prox LAD to Mid LAD lesion is 25% stenosed- instent restenosis of the prior stent.  Mid LAD lesion is 75% stenosed. This was significant by RFR, 0.88.  A drug-eluting stent was successfully placed using a STENT RESOLUTE ONYX 3.5X34, postdilated to 3.75 in overlapping the prior stent. This was optimized with intravascular ultrasound.  Post intervention, there  is a 0% residual stenosis.  2nd Diag lesion is 75% stenosed.  Balloon angioplasty was performed using a BALLOON SAPPHIRE 2.0X12.  Post intervention, there is a 25% residual stenosis.  The left ventricular systolic function is normal.  LV end diastolic pressure is normal.  There is no aortic valve stenosis. Continue aggressive secondary prevention.  Plan for clopidogrel and aspirin for at least 6 months.  Consider clopidogrel monotherapy going forward after 6 months. Plan for same-day discharge.   Myoview 10/08/2018 EF 64, normal perfusion; low risk   Cardiac catheterization 07/12/2008 LM normal LAD proximal 95, mid 50-60; D1 ostial 50 LCx normal RCA mid 40-50 EF 60 PCI: Taxus DES to the proximal LAD   Recent Labs: 05/02/2022: ALT 25; BUN 20; Creatinine, Ser 1.09; Hemoglobin 14.5; Potassium 4.3; Sodium 139; TSH 0.780 05/03/2022: Platelets 99  Recent Lipid Panel    Component Value Date/Time   CHOL 106 05/02/2022 1120   TRIG 68 05/02/2022 1120   HDL 36 (L) 05/02/2022 1120   CHOLHDL 2.9 05/02/2022 1120   CHOLHDL 3.8 10/31/2015 1137   VLDL 40 10/31/2015 1137   LDLCALC 55 05/02/2022 1120     Risk Assessment/Calculations:       Physical Exam:    VS:  BP 112/60   Pulse (!) 55   Ht 5\' 11"  (1.803 m)   Wt 183 lb 6.4 oz (83.2 kg)   SpO2 98%   BMI 25.58 kg/m     Wt Readings from Last 3 Encounters:  06/27/22 183 lb 6.4 oz (83.2 kg)  05/02/22 182 lb 12.8 oz (82.9 kg)  09/07/20 177 lb (80.3 kg)     GEN:  Well nourished, well developed in no acute distress HEENT: Normal NECK: No JVD; No carotid bruits CARDIAC: RRR, systolic murmur. No rubs, gallops RESPIRATORY:  Clear to auscultation without rales, wheezing or rhonchi  ABDOMEN: Soft, non-tender, non-distended MUSCULOSKELETAL:  No edema; No deformity. 2+ pedal pulses, equal bilaterally SKIN: Warm and dry NEUROLOGIC:  Alert and oriented x 3 PSYCHIATRIC:  Normal affect   EKG:  EKG is not ordered today   Diagnoses:     1. Murmur   2. Coronary artery disease involving native coronary artery of native heart without  angina pectoris   3. Essential hypertension   4. Hyperlipidemia LDL goal <50   5. Sinus bradycardia   6. Thrombocytopenia (HCC)     Assessment and Plan:     Murmur: He has a systolic murmur on exam.  We do not have previous echocardiogram to review. He is asymptomatic. Will get echo for evaluation of structural heart disease.   CAD without angina: S/p DES to LAD 2010. Low risk myoview 09/2018. Most recent cardiac cath with DES to mLAD and POBA to D2 in 04/2020. He had stopped both Plavix and aspirin at last office visit 04/2022. He resumed aspirin 81 mg daily. No further symptom of fatigue. He denies chest pain, dyspnea, or other symptoms concerning for angina.  No indication for further ischemic evaluation at this time. No bleeding concerns. Continue atorvastatin, valsartan, amlodipine, aspirin.   Sinus bradycardia: Cardiac monitor 05/24/22 revealed minimum HR 38 bpm during sleep, no significant pauses or bradycardia. He is asymptomatic, no lightheadedness, presyncope or syncope. Feels well with the exception of periods when he is very active with race cars and does not eat or drink appropriately. He is not on AV nodal blocking agents.  Hyperlipidemia LDL goal < 55: LDL 55 on 05/02/2022, well-controlled. Continue atorvastatin.   Thrombocytopenia: He was very fatigued when I last saw him 05/02/22.  CBC obtained and platelet count 99,000.  I encouraged him to follow-up with PCP who reported history of thrombocytopenia, no intervention necessary. His fatigue has resolved. He has resumed aspirin 81 mg for CAD treated with stent.  Advised him we will continue to monitor for bleeding concerns in the future.   Disposition: 6 months with [name] or APP  Medication Adjustments/Labs and Tests Ordered: Current medicines are reviewed at length with the patient today.  Concerns regarding medicines are outlined  above.  Orders Placed This Encounter  Procedures   ECHOCARDIOGRAM COMPLETE   No orders of the defined types were placed in this encounter.   Patient Instructions  Medication Instructions:   Your physician recommends that you continue on your current medications as directed. Please refer to the Current Medication list given to you today.   *If you need a refill on your cardiac medications before your next appointment, please call your pharmacy*   Lab Work:  None ordered.  If you have labs (blood work) drawn today and your tests are completely normal, you will receive your results only by: MyChart Message (if you have MyChart) OR A paper copy in the mail If you have any lab test that is abnormal or we need to change your treatment, we will call you to review the results.   Testing/Procedures:  Your physician has requested that you have an echocardiogram. Echocardiography is a painless test that uses sound waves to create images of your heart. It provides your doctor with information about the size and shape of your heart and how well your heart's chambers and valves are working. This procedure takes approximately one hour. There are no restrictions for this procedure. Please do NOT wear cologne, aftershave, or lotions (deodorant is allowed). Please arrive 15 minutes prior to your appointment time.    Follow-Up: At [facility], you and your health needs are our priority.  As part of our continuing mission to provide you with exceptional heart care, we have created designated Provider Care Teams.  These Care Teams include your primary Cardiologist (physician) and Advanced Practice Providers (APPs -  Physician Assistants and Nurse Practitioners) who all work  together to provide you with the care you need, when you need it.  We recommend signing up for the patient portal called "MyChart".  Sign up information is provided on this After Visit Summary.  MyChart is used to  connect with patients for Virtual Visits (Telemedicine).  Patients are able to view lab/test results, encounter notes, upcoming appointments, etc.  Non-urgent messages can be sent to your provider as well.   To learn more about what you can do with MyChart, go to [email or web address].    Your next appointment:   6 month(s)  Provider:   Donato Schultz, MD     Other Instructions  Your physician wants you to follow-up in: Dr. Donato Johns.  You will receive a reminder letter in the mail two months in advance. If you don't receive a letter, please call our office to schedule the follow-up appointment.     Signed, Levi Aland, NP  06/27/2022 1:06 PM    [facility]

## 2022-06-27 NOTE — Patient Instructions (Signed)
Medication Instructions:   Your physician recommends that you continue on your current medications as directed. Please refer to the Current Medication list given to you today.   *If you need a refill on your cardiac medications before your next appointment, please call your pharmacy*   Lab Work:  None ordered.  If you have labs (blood work) drawn today and your tests are completely normal, you will receive your results only by: MyChart Message (if you have MyChart) OR A paper copy in the mail If you have any lab test that is abnormal or we need to change your treatment, we will call you to review the results.   Testing/Procedures:  Your physician has requested that you have an echocardiogram. Echocardiography is a painless test that uses sound waves to create images of your heart. It provides your doctor with information about the size and shape of your heart and how well your heart's chambers and valves are working. This procedure takes approximately one hour. There are no restrictions for this procedure. Please do NOT wear cologne, aftershave, or lotions (deodorant is allowed). Please arrive 15 minutes prior to your appointment time.    Follow-Up: At [facility], you and your health needs are our priority.  As part of our continuing mission to provide you with exceptional heart care, we have created designated Provider Care Teams.  These Care Teams include your primary Cardiologist (physician) and Advanced Practice Providers (APPs -  Physician Assistants and Nurse Practitioners) who all work together to provide you with the care you need, when you need it.  We recommend signing up for the patient portal called "MyChart".  Sign up information is provided on this After Visit Summary.  MyChart is used to connect with patients for Virtual Visits (Telemedicine).  Patients are able to view lab/test results, encounter notes, upcoming appointments, etc.  Non-urgent messages can be  sent to your provider as well.   To learn more about what you can do with MyChart, go to [email or web address].    Your next appointment:   6 month(s)  Provider:   Donato Schultz, MD     Other Instructions  Your physician wants you to follow-up in: Dr. Donato Schultz.  You will receive a reminder letter in the mail two months in advance. If you don't receive a letter, please call our office to schedule the follow-up appointment.

## 2022-07-01 ENCOUNTER — Ambulatory Visit: Payer: Medicare Other | Admitting: Urology

## 2022-07-01 DIAGNOSIS — N182 Chronic kidney disease, stage 2 (mild): Secondary | ICD-10-CM | POA: Diagnosis not present

## 2022-07-01 DIAGNOSIS — I10 Essential (primary) hypertension: Secondary | ICD-10-CM | POA: Diagnosis not present

## 2022-07-01 DIAGNOSIS — C4490 Unspecified malignant neoplasm of skin, unspecified: Secondary | ICD-10-CM | POA: Diagnosis not present

## 2022-07-01 DIAGNOSIS — M5136 Other intervertebral disc degeneration, lumbar region: Secondary | ICD-10-CM | POA: Diagnosis not present

## 2022-07-01 DIAGNOSIS — N138 Other obstructive and reflux uropathy: Secondary | ICD-10-CM

## 2022-07-01 DIAGNOSIS — I700 Atherosclerosis of aorta: Secondary | ICD-10-CM | POA: Diagnosis not present

## 2022-07-01 DIAGNOSIS — E1122 Type 2 diabetes mellitus with diabetic chronic kidney disease: Secondary | ICD-10-CM | POA: Diagnosis not present

## 2022-07-01 DIAGNOSIS — I2510 Atherosclerotic heart disease of native coronary artery without angina pectoris: Secondary | ICD-10-CM | POA: Diagnosis not present

## 2022-07-01 DIAGNOSIS — D696 Thrombocytopenia, unspecified: Secondary | ICD-10-CM | POA: Diagnosis not present

## 2022-07-01 DIAGNOSIS — E782 Mixed hyperlipidemia: Secondary | ICD-10-CM | POA: Diagnosis not present

## 2022-07-01 DIAGNOSIS — Z0000 Encounter for general adult medical examination without abnormal findings: Secondary | ICD-10-CM | POA: Diagnosis not present

## 2022-07-02 ENCOUNTER — Ambulatory Visit (HOSPITAL_COMMUNITY): Payer: Medicare Other | Attending: Cardiology

## 2022-07-02 DIAGNOSIS — E785 Hyperlipidemia, unspecified: Secondary | ICD-10-CM

## 2022-07-02 DIAGNOSIS — I10 Essential (primary) hypertension: Secondary | ICD-10-CM | POA: Diagnosis not present

## 2022-07-02 DIAGNOSIS — I2510 Atherosclerotic heart disease of native coronary artery without angina pectoris: Secondary | ICD-10-CM | POA: Insufficient documentation

## 2022-07-02 DIAGNOSIS — R011 Cardiac murmur, unspecified: Secondary | ICD-10-CM | POA: Diagnosis not present

## 2022-07-02 LAB — ECHOCARDIOGRAM COMPLETE
AR max vel: 2.22 cm2
AV Area VTI: 2.5 cm2
AV Area mean vel: 2.02 cm2
AV Mean grad: 6.3 mmHg
AV Peak grad: 12 mmHg
Ao pk vel: 1.74 m/s
Area-P 1/2: 2.66 cm2
S' Lateral: 2.6 cm

## 2022-07-03 ENCOUNTER — Encounter: Payer: Self-pay | Admitting: Urology

## 2022-07-03 ENCOUNTER — Ambulatory Visit: Payer: Medicare Other | Admitting: Urology

## 2022-07-03 VITALS — BP 116/66 | HR 51

## 2022-07-03 DIAGNOSIS — R3912 Poor urinary stream: Secondary | ICD-10-CM

## 2022-07-03 DIAGNOSIS — N401 Enlarged prostate with lower urinary tract symptoms: Secondary | ICD-10-CM | POA: Diagnosis not present

## 2022-07-03 DIAGNOSIS — N138 Other obstructive and reflux uropathy: Secondary | ICD-10-CM

## 2022-07-03 DIAGNOSIS — R319 Hematuria, unspecified: Secondary | ICD-10-CM

## 2022-07-03 MED ORDER — FINASTERIDE 5 MG PO TABS: 5.0000 mg | ORAL_TABLET | Freq: Every day | ORAL | 3 refills | Status: DC

## 2022-07-03 MED ORDER — AMOXICILLIN-POT CLAVULANATE 875-125 MG PO TABS
1.0000 | ORAL_TABLET | Freq: Two times a day (BID) | ORAL | 11 refills | Status: DC
Start: 2022-07-03 — End: 2023-06-27

## 2022-07-03 MED ORDER — TAMSULOSIN HCL 0.4 MG PO CAPS
0.4000 mg | ORAL_CAPSULE | Freq: Every day | ORAL | 3 refills | Status: DC
Start: 2022-07-03 — End: 2023-06-27

## 2022-07-03 NOTE — Progress Notes (Signed)
07/03/2022 3:51 PM   [name] 1950/08/04 [id number]  Referring provider: Kirby Funk, MD [address] [place],  [place] [address]  Chief Complaint  Patient presents with   uretheral stricture   Benign Prostatic Hypertrophy    HPI: Mr [name] is a here for followup for urethral stricture, BPH, and chronic cystitis. He self dilates 1-2x per month. No UTIs since last visit. IPSS 10 QOL 1 on flomax and finasteride.    PMH: Past Medical History:  Diagnosis Date   Anxiety    CAD (coronary artery disease)    s/p Taxus DES to LAD in 2010 // Myoview 09/2018: low risk, normal EF    DM2 (diabetes mellitus, type 2) (HCC)    Hx of seasonal allergies    Hyperlipidemia    Hypertension     Surgical History: Past Surgical History:  Procedure Laterality Date   ANGIOPLASTY  2010   stent   CORONARY PRESSURE/FFR STUDY N/A 05/16/2020   Procedure: INTRAVASCULAR PRESSURE WIRE/FFR STUDY;  Surgeon: Corky Crafts, MD;  Location: [facility] INVASIVE CV LAB;  Service: Cardiovascular;  Laterality: N/A;   CORONARY STENT INTERVENTION N/A 05/16/2020   Procedure: CORONARY STENT INTERVENTION;  Surgeon: Corky Crafts, MD;  Location: [facility] INVASIVE CV LAB;  Service: Cardiovascular;  Laterality: N/A;   CORONARY ULTRASOUND/IVUS N/A 05/16/2020   Procedure: Intravascular Ultrasound/IVUS;  Surgeon: Corky Crafts, MD;  Location: [facility] INVASIVE CV LAB;  Service: Cardiovascular;  Laterality: N/A;   LEFT HEART CATH AND CORONARY ANGIOGRAPHY N/A 05/16/2020   Procedure: LEFT HEART CATH AND CORONARY ANGIOGRAPHY;  Surgeon: Corky Crafts, MD;  Location: [facility] INVASIVE CV LAB;  Service: Cardiovascular;  Laterality: N/A;   LUMBAR LAMINECTOMY  1991   L3 and T12    Home Medications:  Allergies as of 07/03/2022       Reactions   Chicken Allergy Shortness Of Breath, Nausea And Vomiting, Other (See Comments)   Headache SAME REACTION(S) TO [place]!!!!   Dicyclomine Hives   Morphine  Shortness Of Breath   Other Anaphylaxis   Ate a fruit salad and had to go to the E.R.   Pineapple Anaphylaxis, Rash   Sulfa Antibiotics Shortness Of Breath   Headache SAME REACTION(S) TO [place]!!!!   Amlodipine Besylate    Other reaction(s): dizziness and edema   Chlorthalidone    Other reaction(s): headache   Naproxen Rash   Other reaction(s): pseudoporphyria        Medication List        Accurate as of Jul 03, 2022  3:51 PM. If you have any questions, ask your nurse or doctor.          acetaminophen 500 MG tablet Commonly known as: TYLENOL Take 1,000 mg by mouth every 8 (eight) hours as needed for moderate pain.   amLODipine 5 MG tablet Commonly known as: NORVASC Take 5 mg by mouth daily.   aspirin EC 81 MG tablet Take 81 mg by mouth daily.   atorvastatin 80 MG tablet Commonly known as: LIPITOR Take 1 tablet (80 mg total) by mouth daily.   Biotin 5000 MCG Tabs 1 tablet   citalopram 10 MG tablet Commonly known as: CELEXA Take 10 mg by mouth daily.   clopidogrel 75 MG tablet Commonly known as: PLAVIX 1 tablet Orally Once a day   Co Q 10 100 MG Caps Take 100 mg by mouth daily.   cyanocobalamin 1000 MCG tablet Commonly known as: VITAMIN B12 Take 1,000 mcg by mouth  3 (three) times a week.   finasteride 5 MG tablet Commonly known as: PROSCAR Take 5 mg by mouth daily.   fluorouracil 5 % cream Commonly known as: EFUDEX SMARTSIG:sparingly Topical Twice Daily   fluticasone 50 MCG/ACT nasal spray Commonly known as: FLONASE Place 1 spray into both nostrils as needed for allergies.   gabapentin 300 MG capsule Commonly known as: NEURONTIN Take 600 mg by mouth at bedtime. And may take an additional 300 mg once a day if needed   metFORMIN 500 MG 24 hr tablet Commonly known as: GLUCOPHAGE-XR Take 500 mg by mouth daily.   nitroGLYCERIN 0.4 MG SL tablet Commonly known as: NITROSTAT Place 1 tablet (0.4 mg total) under the tongue every 5 (five) minutes  as needed for chest pain.   pantoprazole 40 MG tablet Commonly known as: PROTONIX Take 40 mg by mouth as needed (for acid reflux).   SAW PALMETTO PO Take 100 mg by mouth 2 (two) times daily.   tamsulosin 0.4 MG Caps capsule Commonly known as: FLOMAX Take 1 capsule by mouth at bedtime   traMADol 50 MG tablet Commonly known as: ULTRAM Take by mouth as needed.   valsartan 80 MG tablet Commonly known as: DIOVAN TAKE 1 TABLET BY MOUTH DAILY        Allergies:  Allergies  Allergen Reactions   Chicken Allergy Shortness Of Breath, Nausea And Vomiting and Other (See Comments)    Headache SAME REACTION(S) TO [place]!!!!   Dicyclomine Hives   Morphine Shortness Of Breath   Other Anaphylaxis    Ate a fruit salad and had to go to the E.R.   Pineapple Anaphylaxis and Rash   Sulfa Antibiotics Shortness Of Breath    Headache SAME REACTION(S) TO [place]!!!!   Amlodipine Besylate     Other reaction(s): dizziness and edema   Chlorthalidone     Other reaction(s): headache   Naproxen Rash    Other reaction(s): pseudoporphyria    Family History: Family History  Problem Relation Age of Onset   Breast cancer Mother    Lung cancer Father    Colon cancer Neg Hx    Stomach cancer Neg Hx    Esophageal cancer Neg Hx    Pancreatic cancer Neg Hx     Social History:  reports that he has never smoked. He has never used smokeless tobacco. He reports current alcohol use. He reports that he does not use drugs.  ROS: All other review of systems were reviewed and are negative except what is noted above in HPI  Physical Exam: BP 116/66   Pulse (!) 51   Constitutional:  Alert and oriented, No acute distress. HEENT:  AT, moist mucus membranes.  Trachea midline, no masses. Cardiovascular: No clubbing, cyanosis, or edema. Respiratory: Normal respiratory effort, no increased work of breathing. GI: Abdomen is soft, nontender, nondistended, no abdominal masses GU: No CVA tenderness.  Lymph:  No cervical or inguinal lymphadenopathy. Skin: No rashes, bruises or suspicious lesions. Neurologic: Grossly intact, no focal deficits, moving all 4 extremities. Psychiatric: Normal mood and affect.  Laboratory Data: Lab Results  Component Value Date   WBC 4.5 05/02/2022   HGB 14.5 05/02/2022   HCT 41.2 05/02/2022   MCV 91 05/02/2022   PLT 99 (LL) 05/03/2022    Lab Results  Component Value Date   CREATININE 1.09 05/02/2022    No results found for: "PSA"  No results found for: "TESTOSTERONE"  No results found for: "HGBA1C"  Urinalysis    Component  Value Date/Time   COLORURINE YELLOW 06/27/2018 1120   APPEARANCEUR Clear 01/02/2022 1113   LABSPEC 1.011 06/27/2018 1120   PHURINE 5.0 06/27/2018 1120   GLUCOSEU 3+ (A) 01/02/2022 1113   HGBUR NEGATIVE 06/27/2018 1120   BILIRUBINUR Negative 01/02/2022 1113   KETONESUR NEGATIVE 06/27/2018 1120   PROTEINUR Negative 01/02/2022 1113   PROTEINUR NEGATIVE 06/27/2018 1120   NITRITE Negative 01/02/2022 1113   NITRITE NEGATIVE 06/27/2018 1120   LEUKOCYTESUR Negative 01/02/2022 1113   LEUKOCYTESUR NEGATIVE 06/27/2018 1120    Lab Results  Component Value Date   LABMICR Comment 01/02/2022   WBCUA 0-5 08/24/2021   LABEPIT 0-10 08/24/2021   MUCUS Present 08/24/2021   BACTERIA Many (A) 08/24/2021    Pertinent Imaging:  Results for orders placed in visit on 05/14/21  DG Abd 1 View  Narrative CLINICAL DATA:  Kidney stones  EXAM: ABDOMEN - 1 VIEW  COMPARISON:  CT 02/08/2018.  KUB 07/12/2019  FINDINGS: The bowel gas pattern is normal. No radio-opaque calculi or other significant radiographic abnormality are seen. Postoperative changes at the thoracolumbar and lumbosacral junctions.  IMPRESSION: Negative.   Electronically Signed By: Charlett Nose M.D. On: 05/16/2021 09:08  No results found for this or any previous visit.  No results found for this or any previous visit.  No results found for this or any  previous visit.  Results for orders placed during the hospital encounter of 01/30/12  US Renal  Narrative *RADIOLOGY REPORT*  Clinical Data:  Lower back and abdominal pain.  Pyelonephritis. History of kidney stones.  RENAL/URINARY TRACT ULTRASOUND COMPLETE  Comparison:  None.  Findings:  Right Kidney:  Right kidney is normal in size and echogenicity with no mass or hydronephrosis.  The right kidney measures 11.6 cm.  Left Kidney:  Normal in size and parenchymal echogenicity.  No evidence of mass or hydronephrosis.  The left kidney measures 10.9 cm.  Bladder:  Appears normal for degree of bladder distention.  IMPRESSION: Normal study.   Original Report Authenticated By: Amie Portland, M.D.  No valid procedures specified. No results found for this or any previous visit.  Results for orders placed in visit on 07/11/21  CT RENAL STONE STUDY  Narrative CLINICAL DATA:  Flank pain  EXAM: CT ABDOMEN AND PELVIS WITHOUT CONTRAST  TECHNIQUE: Multidetector CT imaging of the abdomen and pelvis was performed following the standard protocol without IV contrast.  RADIATION DOSE REDUCTION: This exam was performed according to the departmental dose-optimization program which includes automated exposure control, adjustment of the mA and/or kV according to patient size and/or use of iterative reconstruction technique.  COMPARISON:  CT abdomen and pelvis dated February 08, 2018  FINDINGS: Lower chest: Coronary artery calcifications. Mild bibasilar atelectasis of the partially visualized lung bases.  Hepatobiliary: No focal liver abnormality is seen. No gallstones, gallbladder wall thickening, or biliary dilatation.  Pancreas: Unremarkable. No pancreatic ductal dilatation or surrounding inflammatory changes.  Spleen: Normal in size without focal abnormality.  Adrenals/Urinary Tract: Bilateral adrenal glands are unremarkable. No hydronephrosis or nephrolithiasis. Bladder  is unremarkable.  Stomach/Bowel: Stomach is within normal limits. Appendix appears normal. No evidence of bowel wall thickening, distention, or inflammatory changes.  Vascular/Lymphatic: Aortic atherosclerosis. No enlarged abdominal or pelvic lymph nodes.  Reproductive: Mild prostatomegaly.  Other: No abdominal wall hernia or abnormality. No abdominopelvic ascites.  Musculoskeletal: Posterior rods at T11-T12 and posterior fusion of L5-S1. Unchanged compression deformities at L1 and T12.  IMPRESSION: 1. No acute findings in the abdomen or pelvis including  no evidence of hydronephrosis or nephroureterolithiasis. 2.  Aortic Atherosclerosis (ICD10-I70.0).   Electronically Signed By: Allegra Lai M.D. On: 07/11/2021 10:56   Assessment & Plan:    1. Benign prostatic hyperplasia with urinary obstruction -continue flomax and finasteride - Urinalysis, Routine w reflex microscopic  2. Weak urinary stream -continue flomax and finasteride  3. Urethral stricture -continue self cath    No follow-ups on file.  Wilkie Aye, MD  [facility] [place]

## 2022-07-04 LAB — URINALYSIS, ROUTINE W REFLEX MICROSCOPIC
Bilirubin, UA: NEGATIVE
Glucose, UA: NEGATIVE
Ketones, UA: NEGATIVE
Nitrite, UA: NEGATIVE
Protein,UA: NEGATIVE
RBC, UA: NEGATIVE
Specific Gravity, UA: 1.025 (ref 1.005–1.030)
Urobilinogen, Ur: 0.2 mg/dL (ref 0.2–1.0)
pH, UA: 6 (ref 5.0–7.5)

## 2022-07-04 LAB — MICROSCOPIC EXAMINATION
Bacteria, UA: NONE SEEN
RBC, Urine: NONE SEEN /hpf (ref 0–2)

## 2022-07-09 ENCOUNTER — Encounter: Payer: Self-pay | Admitting: Urology

## 2022-07-09 NOTE — Patient Instructions (Signed)

## 2022-07-31 DIAGNOSIS — X32XXXA Exposure to sunlight, initial encounter: Secondary | ICD-10-CM | POA: Diagnosis not present

## 2022-07-31 DIAGNOSIS — D225 Melanocytic nevi of trunk: Secondary | ICD-10-CM | POA: Diagnosis not present

## 2022-07-31 DIAGNOSIS — L570 Actinic keratosis: Secondary | ICD-10-CM | POA: Diagnosis not present

## 2022-07-31 DIAGNOSIS — Z1283 Encounter for screening for malignant neoplasm of skin: Secondary | ICD-10-CM | POA: Diagnosis not present

## 2022-12-25 ENCOUNTER — Ambulatory Visit: Payer: Medicare Other | Admitting: Physician Assistant

## 2023-01-28 ENCOUNTER — Ambulatory Visit: Payer: Medicare Other | Admitting: Physician Assistant

## 2023-01-29 ENCOUNTER — Telehealth: Payer: Self-pay | Admitting: Cardiology

## 2023-01-29 NOTE — Telephone Encounter (Signed)
Pt c/o Shortness Of Breath: STAT if SOB developed within the last 24 hours or pt is noticeably SOB on the phone  1. Are you currently SOB (can you hear that pt is SOB on the phone)? No, but all day yesterday the pt was having problems   2. How long have you been experiencing SOB? Couple weeks ago off and on  3. Are you SOB when sitting or when up moving around? Coming and going   4. Are you currently experiencing any other symptoms? Dizziness and headaches.

## 2023-01-29 NOTE — Telephone Encounter (Signed)
Patient reports that for the past couple of weeks he has had worsening shortness of breath with exertion. He reports that he becomes tired with minimal exertion. He denies any swelling. He does report that he has had a few episodes of pain on the upper left side of his chest near his collar bone. He reports that he has not been good about writing down his blood pressures but last week he did get a reading that he thinks was around 178/60. He reports headaches and some dizziness. Encouraged patient to start keeping up with his BP, ensure hydration. Education on nitroglycerin use for chest pain and ER precautions. He is scheduled to see Tereso Newcomer, PA-C this coming Friday 12/13.

## 2023-01-29 NOTE — Telephone Encounter (Signed)
Left message for patient to call back  

## 2023-01-29 NOTE — Telephone Encounter (Signed)
If symptoms worsen, go to the ED. Repeat BP. If BP still >/= 140/90, increase Amlodipine from 5 mg to 10 mg once daily. Tereso Newcomer, PA-C    01/29/2023 4:56 PM

## 2023-01-30 NOTE — Progress Notes (Signed)
Cardiology Office Note:    Date:  01/31/2023  ID:  [name], DOB 12/04/1950, MRN [id number] PCP: Austin Funk, MD (Inactive)  [facility] Providers Cardiologist:  Austin Schultz, MD       Patient Profile:      Coronary artery disease  S/p DES to LAD in 2010 Myoview 09/2018: Low risk S/p 3.5 x 34 mm DES to mLAD and POBA to D2 in 04/2020 TTE 07/02/22: EF 60-65, no RWMA, GLS -20.7, NL RVSF, NL PASP, RVSP 27, trivial AI, RAP 8 Bradycardia  Monitor 05/2022: NSR, Avg HR 60, brief ATach, Rare PVCs/PACs, no AF/pauses/HB Hypertension  Hyperlipidemia  Diabetes mellitus  Lumbar disc disease  Anxiety   Thrombocytopenia        History of Present Illness:  Discussed the use of AI scribe software for clinical note transcription with the patient, who gave verbal consent to proceed.  [name] is a 72 y.o. male who returns for evaluation of shortness of breath. He was last seen by Austin Bridegroom, NP in 06/2022. He called in recently with shortness of breath and elevated BP. He is here with his wife. He notes a month-long history of shortness of breath with exertion sometimes accompanied by L sided chest discomfort described as a pain that did not radiate. The patient also reports one episode of sweating during these symptoms. The patient had been on Jardiance for prediabetes but discontinued the medication due to the aforementioned symptoms. Since discontinuation, the patient reports a gradual improvement in symptoms over a few weeks. He also notes a UTI thought to be related to the [place]. In addition to the above, the patient reported experiencing headaches for a week. The patient's blood pressure had been higher than usual, with readings of 175/70 and 165/65 reported in the past weeks. The patient is currently on Valsartan 80mg  twice daily and Amlodipine 5mg  daily for hypertension. 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       Review of Systems  Gastrointestinal:  Negative for hematochezia and melena.  Genitourinary:  Negative for hematuria.  Neurological:  Positive for headaches.  See HPI     Studies Reviewed:   EKG Interpretation Date/Time:  Friday January 31 2023 10:00:26 EST Ventricular Rate:  48 PR Interval:  146 QRS Duration:  90 QT Interval:  424 QTC Calculation: 378 R Axis:   13  Text Interpretation: Sinus bradycardia Normal axis No ST-TW changes No significant change since last tracing Confirmed by [name] [phone]) on 01/31/2023 10:07:42 AM    Results -chart review LABS Potassium: 4.3 (05/02/2022) Creatinine: 1.09 (05/02/2022) ALT: 25 (05/02/2022) Total cholesterol: 106 (05/02/2022) HDL: 36 (05/02/2022) Triglyceride: 68 (05/02/2022) LDL: 55 (05/02/2022) Hemoglobin: 14.5 (05/02/2022) Platelet count: 99,000 (05/02/2022)      Risk Assessment/Calculations:             Physical Exam:   VS:  BP 136/60   Pulse (!) 48   Ht 5\' 11"  (1.803 m)   Wt 188 lb 6.4 oz (85.5 kg)   SpO2 94%   BMI 26.28 kg/m    Wt Readings from Last 3 Encounters:  01/31/23 188 lb 6.4 oz (85.5 kg)  06/27/22 183 lb 6.4 oz (83.2 kg)  05/02/22 182 lb 12.8 oz (82.9 kg)    Constitutional:      Appearance: Healthy appearance. Not in distress.  Neck:     Vascular: JVD normal.  Pulmonary:     Breath sounds: Normal breath  sounds. No wheezing. No rales.  Cardiovascular:     Normal rate. Regular rhythm.     Murmurs: There is a grade 2/6 systolic murmur at the ULSB.  Edema:    Peripheral edema absent.  Abdominal:     Palpations: Abdomen is soft.         Assessment and Plan:   Assessment & Plan Coronary artery disease involving native coronary artery of native heart with angina pectoris (HCC) History of DES to the LAD in 2010 and subsequent DES to the mid LAD in 2022.  He also underwent angioplasty of the second diagonal in 2022.  EF was 60-65 on echocardiogram in May 2024.  He notes a recent history of  exertional shortness of breath and left-sided chest discomfort.  This was not like his previous symptoms of angina.  He does not appear volume overloaded on exam today.  His lungs are clear and his neck veins are flat.  His EKG today does not demonstrate any acute changes.  Over the past several days his symptoms have improved.  He questions whether or not he was having side effects related to the [place].  It has been several years since his last assessment for ischemia.  He also has bradycardia.  I suspect he does not have chronotropic incompetence.  However, an exercise test will help rule that out. -Arrange ETT-Myoview -Continue aspirin 81 mg daily, Lipitor 80 mg daily, Plavix 75 mg daily, nitroglycerin as needed -Follow-up 6 months (will cancel appointment in January if stress test normal) Essential hypertension Blood pressure controlled in clinic.  He notes higher readings at home.  He will check his monitor to see if it is working correctly.  For now, continue amlodipine 5 mg daily, valsartan 80 mg twice daily. Hyperlipidemia LDL optimal.  Continue Lipitor 80 mg daily. Thrombocytopenia (HCC) Platelet count around the thousand.  This is fairly chronic.  Continue follow-up with primary care.  He has not had any symptoms of bleeding.   Informed Consent   Shared Decision Making/Informed Consent The risks [chest pain, shortness of breath, cardiac arrhythmias, dizziness, blood pressure fluctuations, myocardial infarction, stroke/transient ischemic attack, nausea, vomiting, allergic reaction, radiation exposure, metallic taste sensation and life-threatening complications (estimated to be 1 in 10,000)], benefits (risk stratification, diagnosing coronary artery disease, treatment guidance) and alternatives of a nuclear stress test were discussed in detail with [name] and he agrees to proceed.     Dispo:  Return in about 6 months (around 08/01/2023) for Routine Follow Up, w/ Dr. Anne Fu, or Austin Newcomer, PA-C.  Signed, Austin Newcomer, PA-C

## 2023-01-31 ENCOUNTER — Encounter: Payer: Self-pay | Admitting: Physician Assistant

## 2023-01-31 ENCOUNTER — Ambulatory Visit: Payer: Medicare Other | Attending: Physician Assistant | Admitting: Physician Assistant

## 2023-01-31 VITALS — BP 136/60 | HR 48 | Ht 71.0 in | Wt 188.4 lb

## 2023-01-31 DIAGNOSIS — I10 Essential (primary) hypertension: Secondary | ICD-10-CM | POA: Diagnosis not present

## 2023-01-31 DIAGNOSIS — D696 Thrombocytopenia, unspecified: Secondary | ICD-10-CM | POA: Diagnosis not present

## 2023-01-31 DIAGNOSIS — I25119 Atherosclerotic heart disease of native coronary artery with unspecified angina pectoris: Secondary | ICD-10-CM

## 2023-01-31 DIAGNOSIS — E785 Hyperlipidemia, unspecified: Secondary | ICD-10-CM | POA: Diagnosis not present

## 2023-01-31 DIAGNOSIS — I2510 Atherosclerotic heart disease of native coronary artery without angina pectoris: Secondary | ICD-10-CM

## 2023-01-31 DIAGNOSIS — R0602 Shortness of breath: Secondary | ICD-10-CM

## 2023-01-31 NOTE — Assessment & Plan Note (Signed)
Blood pressure controlled in clinic.  He notes higher readings at home.  He will check his monitor to see if it is working correctly.  For now, continue amlodipine 5 mg daily, valsartan 80 mg twice daily.

## 2023-01-31 NOTE — Patient Instructions (Signed)
Medication Instructions:  Your physician recommends that you continue on your current medications as directed. Please refer to the Current Medication list given to you today.   *If you need a refill on your cardiac medications before your next appointment, please call your pharmacy*   Lab Work: None ordered  If you have labs (blood work) drawn today and your tests are completely normal, you will receive your results only by: MyChart Message (if you have MyChart) OR A paper copy in the mail If you have any lab test that is abnormal or we need to change your treatment, we will call you to review the results.   Testing/Procedures: Your physician has requested that you have en exercise stress myoview. For further information please visit [email or web address] Please follow instruction sheet, BELOW:  - DO NOT EAT, DRINK, OR USE TOBACCO PRODUCTS WITHIN 4 HOURS OF THE TEST - DRESS PREPARED TO EXERCISE IN A COMFORTABLE, 2 PIECE CLOTHING OUTFIT AND WALKING SHOES - BRING ANY PRESCRIPTION MEDICATIONS WITH YOU  - NOTIFY THE OFFICE 24 HOURS IN ADVANCE IF YOU NEED TO CANCEL - CALL THE OFFICE [phone] IF YOU HAVE ANY QUESTIONS     Follow-Up: At [facility], you and your health needs are our priority.  As part of our continuing mission to provide you with exceptional heart care, we have created designated Provider Care Teams.  These Care Teams include your primary Cardiologist (physician) and Advanced Practice Providers (APPs -  Physician Assistants and Nurse Practitioners) who all work together to provide you with the care you need, when you need it.  We recommend signing up for the patient portal called "MyChart".  Sign up information is provided on this After Visit Summary.  MyChart is used to connect with patients for Virtual Visits (Telemedicine).  Patients are able to view lab/test results, encounter notes, upcoming appointments, etc.  Non-urgent messages can be sent to your  provider as well.   To learn more about what you can do with MyChart, go to [email or web address].    Your next appointment:   As scheduled  Provider:   Jari Favre, PA-C         Other Instructions

## 2023-01-31 NOTE — Assessment & Plan Note (Signed)
Platelet count around the thousand.  This is fairly chronic.  Continue follow-up with primary care.  He has not had any symptoms of bleeding.

## 2023-01-31 NOTE — Assessment & Plan Note (Signed)
History of DES to the LAD in 2010 and subsequent DES to the mid LAD in 2022.  He also underwent angioplasty of the second diagonal in 2022.  EF was 60-65 on echocardiogram in May 2024.  He notes a recent history of exertional shortness of breath and left-sided chest discomfort.  This was not like his previous symptoms of angina.  He does not appear volume overloaded on exam today.  His lungs are clear and his neck veins are flat.  His EKG today does not demonstrate any acute changes.  Over the past several days his symptoms have improved.  He questions whether or not he was having side effects related to the [place].  It has been several years since his last assessment for ischemia.  He also has bradycardia.  I suspect he does not have chronotropic incompetence.  However, an exercise test will help rule that out. -Arrange ETT-Myoview -Continue aspirin 81 mg daily, Lipitor 80 mg daily, Plavix 75 mg daily, nitroglycerin as needed -Follow-up 6 months (will cancel appointment in January if stress test normal)

## 2023-01-31 NOTE — Telephone Encounter (Signed)
See OV note from 01/31/2023 Tereso Newcomer, PA-C    01/31/2023 1:04 PM

## 2023-02-05 ENCOUNTER — Encounter (HOSPITAL_COMMUNITY): Payer: Self-pay

## 2023-02-06 ENCOUNTER — Telehealth (HOSPITAL_COMMUNITY): Payer: Self-pay | Admitting: *Deleted

## 2023-02-06 NOTE — Telephone Encounter (Signed)
Spoke with patient and he was given detailed instructions about his STRESS TEST on 02/07/23 at 10:45.

## 2023-02-07 ENCOUNTER — Ambulatory Visit (HOSPITAL_COMMUNITY): Payer: Medicare Other | Attending: Physician Assistant

## 2023-02-07 DIAGNOSIS — I25119 Atherosclerotic heart disease of native coronary artery with unspecified angina pectoris: Secondary | ICD-10-CM | POA: Insufficient documentation

## 2023-02-07 DIAGNOSIS — R0602 Shortness of breath: Secondary | ICD-10-CM | POA: Insufficient documentation

## 2023-02-07 LAB — MYOCARDIAL PERFUSION IMAGING
Angina Index: 1
Duke Treadmill Score: 5
Estimated workload: 10.1
Exercise duration (min): 8 min
Exercise duration (sec): 30 s
LV dias vol: 84 mL (ref 62–150)
LV sys vol: 33 mL
MPHR: 146 {beats}/min
Nuc Stress EF: 60 %
Peak HR: 134 {beats}/min
Percent HR: 91 %
Rest HR: 48 {beats}/min
Rest Nuclear Isotope Dose: 10.3 mCi
SDS: 0
SRS: 0
SSS: 0
ST Depression (mm): 0 mm
Stress Nuclear Isotope Dose: 29.7 mCi
TID: 0.88

## 2023-02-07 MED ORDER — TECHNETIUM TC 99M TETROFOSMIN IV KIT
29.7000 | PACK | Freq: Once | INTRAVENOUS | Status: AC | PRN
  Administered 2023-02-07: 29.7 via INTRAVENOUS

## 2023-02-07 MED ORDER — TECHNETIUM TC 99M TETROFOSMIN IV KIT
10.3000 | PACK | Freq: Once | INTRAVENOUS | Status: AC | PRN
  Administered 2023-02-07: 10.3 via INTRAVENOUS

## 2023-02-11 ENCOUNTER — Telehealth: Payer: Self-pay | Admitting: Cardiology

## 2023-02-11 NOTE — Telephone Encounter (Signed)
Patient returned RN's call regarding results. 

## 2023-03-05 ENCOUNTER — Ambulatory Visit: Payer: Medicare Other | Admitting: Physician Assistant

## 2023-03-14 DIAGNOSIS — C4441 Basal cell carcinoma of skin of scalp and neck: Secondary | ICD-10-CM | POA: Diagnosis not present

## 2023-03-14 DIAGNOSIS — X32XXXD Exposure to sunlight, subsequent encounter: Secondary | ICD-10-CM | POA: Diagnosis not present

## 2023-03-14 DIAGNOSIS — L570 Actinic keratosis: Secondary | ICD-10-CM | POA: Diagnosis not present

## 2023-03-28 ENCOUNTER — Ambulatory Visit: Payer: Self-pay | Admitting: Internal Medicine

## 2023-03-28 NOTE — Telephone Encounter (Signed)
  Reason for Disposition  Cough has been present for > 3 weeks  Answer Assessment - Initial Assessment Questions 1. ONSET: When did the cough begin?      3 weeks  2. SEVERITY: How bad is the cough today?       Getting worse, keeping patient up at night  3. HEMOPTYSIS: Are you coughing up any blood? If so ask: How much? (flecks, streaks, tablespoons, etc.)     N/A  4. DIFFICULTY BREATHING: Are you having difficulty breathing? If Yes, ask: How bad is it? (e.g., mild, moderate, severe)    - MILD: No SOB at rest, mild SOB with walking, speaks normally in sentences, can lie down, no retractions, pulse < 100.    - MODERATE: SOB at rest, SOB with minimal exertion and prefers to sit, cannot lie down flat, speaks in phrases, mild retractions, audible wheezing, pulse 100-120.    - SEVERE: Very SOB at rest, speaks in single words, struggling to breathe, sitting hunched forward, retractions, pulse > 120    Breathing fine at rest. A little short of breath with activity   5. FEVER: Do you have a fever? If Yes, ask: What is your temperature, how was it measured, and when did it start?     No   6. OTHER SYMPTOMS: Do you have any other symptoms? (e.g., runny nose, wheezing, chest pain)       Low energy/weak  Protocols used: Cough - Acute Non-Productive-A-AH   Chief Complaint: Cough Symptoms: Cough and generalized weakness Frequency: Ongoing for 3 weeks Pertinent Negatives: Patient denies fever Disposition: [] ED /[x] Urgent Care (no appt availability in office) / [] Appointment(In office/virtual)/ []  [facility]/ [] Home Care/ [] Refused Recommended Disposition /[] [place]/ []  Follow-up with PCP Additional Notes: Patient stated he has been having a cough and generalized weakness for 3 weeks. The cough seems to be getting worse, but the weakness is improving. He has been taking cough drops and Tylenol . Offered to scheduled appt for Monday and advised urgent care  if patient feels he cannot wait until then. Patient declined appt because the location was too far. Patient is going to urgent care.

## 2023-04-29 DIAGNOSIS — G629 Polyneuropathy, unspecified: Secondary | ICD-10-CM | POA: Diagnosis not present

## 2023-04-29 DIAGNOSIS — M519 Unspecified thoracic, thoracolumbar and lumbosacral intervertebral disc disorder: Secondary | ICD-10-CM | POA: Diagnosis not present

## 2023-06-27 ENCOUNTER — Ambulatory Visit (INDEPENDENT_AMBULATORY_CARE_PROVIDER_SITE_OTHER): Payer: Medicare Other | Admitting: Internal Medicine

## 2023-06-27 ENCOUNTER — Encounter: Payer: Self-pay | Admitting: Internal Medicine

## 2023-06-27 VITALS — BP 138/58 | HR 48 | Temp 98.1°F | Ht 71.0 in | Wt 192.2 lb

## 2023-06-27 DIAGNOSIS — D696 Thrombocytopenia, unspecified: Secondary | ICD-10-CM | POA: Diagnosis not present

## 2023-06-27 DIAGNOSIS — M5441 Lumbago with sciatica, right side: Secondary | ICD-10-CM

## 2023-06-27 DIAGNOSIS — E1169 Type 2 diabetes mellitus with other specified complication: Secondary | ICD-10-CM | POA: Diagnosis not present

## 2023-06-27 DIAGNOSIS — N138 Other obstructive and reflux uropathy: Secondary | ICD-10-CM

## 2023-06-27 DIAGNOSIS — M5442 Lumbago with sciatica, left side: Secondary | ICD-10-CM | POA: Diagnosis not present

## 2023-06-27 DIAGNOSIS — E118 Type 2 diabetes mellitus with unspecified complications: Secondary | ICD-10-CM | POA: Diagnosis not present

## 2023-06-27 DIAGNOSIS — N401 Enlarged prostate with lower urinary tract symptoms: Secondary | ICD-10-CM

## 2023-06-27 DIAGNOSIS — I10 Essential (primary) hypertension: Secondary | ICD-10-CM | POA: Diagnosis not present

## 2023-06-27 DIAGNOSIS — E785 Hyperlipidemia, unspecified: Secondary | ICD-10-CM | POA: Diagnosis not present

## 2023-06-27 DIAGNOSIS — K2100 Gastro-esophageal reflux disease with esophagitis, without bleeding: Secondary | ICD-10-CM

## 2023-06-27 DIAGNOSIS — G8929 Other chronic pain: Secondary | ICD-10-CM

## 2023-06-27 LAB — COMPREHENSIVE METABOLIC PANEL WITH GFR
ALT: 16 U/L (ref 0–53)
AST: 17 U/L (ref 0–37)
Albumin: 4.2 g/dL (ref 3.5–5.2)
Alkaline Phosphatase: 47 U/L (ref 39–117)
BUN: 15 mg/dL (ref 6–23)
CO2: 29 meq/L (ref 19–32)
Calcium: 9.1 mg/dL (ref 8.4–10.5)
Chloride: 107 meq/L (ref 96–112)
Creatinine, Ser: 1.13 mg/dL (ref 0.40–1.50)
GFR: 63.66 mL/min (ref >60.00)
Glucose, Bld: 143 mg/dL — ABNORMAL HIGH (ref 70–99)
Potassium: 4.6 meq/L (ref 3.5–5.1)
Sodium: 139 meq/L (ref 135–145)
Total Bilirubin: 0.8 mg/dL (ref 0.2–1.2)
Total Protein: 6.5 g/dL (ref 6.0–8.3)

## 2023-06-27 LAB — LIPID PANEL
Cholesterol: 125 mg/dL (ref 0–200)
HDL: 39.3 mg/dL (ref >39.00)
LDL Cholesterol: 59 mg/dL (ref 0–99)
NonHDL: 86.06
Total CHOL/HDL Ratio: 3
Triglycerides: 133 mg/dL (ref 0.0–149.0)
VLDL: 26.6 mg/dL (ref 0.0–40.0)

## 2023-06-27 LAB — CBC
HCT: 40.1 % (ref 39.0–52.0)
Hemoglobin: 13.9 g/dL (ref 13.0–17.0)
MCHC: 34.6 g/dL (ref 30.0–36.0)
MCV: 93.3 fl (ref 78.0–100.0)
Platelets: 101 10*3/uL — ABNORMAL LOW (ref 150.0–400.0)
RBC: 4.3 Mil/uL (ref 4.22–5.81)
RDW: 14.6 % (ref 11.5–15.5)
WBC: 4.7 10*3/uL (ref 4.0–10.5)

## 2023-06-27 LAB — MICROALBUMIN / CREATININE URINE RATIO
Creatinine,U: 68.3 mg/dL
Microalb Creat Ratio: 12.9 mg/g (ref 0.0–30.0)
Microalb, Ur: 0.9 mg/dL (ref 0.0–1.9)

## 2023-06-27 LAB — HEMOGLOBIN A1C: Hgb A1c MFr Bld: 6.1 % (ref 4.6–6.5)

## 2023-06-27 NOTE — Assessment & Plan Note (Signed)
 Checking lipid panel and adjust lipitor as needed. He did not have doses asked him to call and confirm.

## 2023-06-27 NOTE — Progress Notes (Signed)
   Subjective:   Patient ID: [name], male    DOB: [birth year], 73 y.o.   MRN: [id number]  HPI The patient is a new 73 YO man coming in for medical management (see A/P for details) needs new provider his left prior practice.   PMH, [facility], social history reviewed and updated  Review of Systems  Constitutional: Negative.   HENT: Negative.    Eyes: Negative.   Respiratory:  Negative for cough, chest tightness and shortness of breath.   Cardiovascular:  Negative for chest pain, palpitations and leg swelling.  Gastrointestinal:  Negative for abdominal distention, abdominal pain, constipation, diarrhea, nausea and vomiting.  Musculoskeletal: Negative.   Skin: Negative.   Neurological:  Positive for numbness.  Psychiatric/Behavioral: Negative.      Objective:  Physical Exam Constitutional:      Appearance: He is well-developed.  HENT:     Head: Normocephalic and atraumatic.  Cardiovascular:     Rate and Rhythm: Normal rate and regular rhythm.  Pulmonary:     Effort: Pulmonary effort is normal. No respiratory distress.     Breath sounds: Normal breath sounds. No wheezing or rales.  Abdominal:     General: Bowel sounds are normal. There is no distension.     Palpations: Abdomen is soft.     Tenderness: There is no abdominal tenderness. There is no rebound.  Musculoskeletal:     Cervical back: Normal range of motion.  Skin:    General: Skin is warm and dry.     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     Coordination: Coordination normal.     Vitals:   06/27/23 0944  BP: (!) 138/58  Pulse: (!) 48  Temp: 98.1 F (36.7 C)  SpO2: 98%  Weight: 192 lb 3.2 oz (87.2 kg)  Height: 5\' 11"  (1.803 m)    Assessment & Plan:

## 2023-06-27 NOTE — Assessment & Plan Note (Signed)
 Checking CBC.

## 2023-06-27 NOTE — Assessment & Plan Note (Signed)
 BP is at goal on valsartan /hydrochlorothiazide  320/12.5 mg daily. Checking CMP and adjust as needed as he is unsure of last labs.

## 2023-06-27 NOTE — Assessment & Plan Note (Signed)
 May or may not be taking protonix  he was unsure of med list. Asked him to confirm.

## 2023-06-27 NOTE — Assessment & Plan Note (Signed)
 Takes gabapentin  and tramadol  for pain.

## 2023-06-27 NOTE — Assessment & Plan Note (Signed)
 Taking meds and unsure which but is taking finasteride . Asked him to confirm meds and call us  with names and doses.

## 2023-06-27 NOTE — Assessment & Plan Note (Signed)
 He is unsure of meds he takes for this. Asked him to confirm and let us  know. He is on statin and ARB. Checking HgA1c and UACR and CMP and lipid panel and adjust as needed. Gets yearly eye exam and foot exam done today.

## 2023-06-30 ENCOUNTER — Encounter: Payer: Self-pay | Admitting: Internal Medicine

## 2023-07-02 ENCOUNTER — Ambulatory Visit: Payer: Self-pay

## 2023-07-07 ENCOUNTER — Ambulatory Visit: Payer: Medicare Other | Admitting: Urology

## 2023-07-16 ENCOUNTER — Ambulatory Visit: Payer: Medicare Other | Admitting: Urology

## 2023-07-24 ENCOUNTER — Other Ambulatory Visit: Payer: Self-pay | Admitting: Urology

## 2023-07-24 DIAGNOSIS — R319 Hematuria, unspecified: Secondary | ICD-10-CM

## 2023-07-29 ENCOUNTER — Telehealth: Payer: Self-pay | Admitting: Internal Medicine

## 2023-07-29 NOTE — Telephone Encounter (Signed)
 Copied from CRM [id number]. Topic: Medical Record Request - Other >> Jul 29, 2023 11:50 AM Bambi Bonine D wrote: Reason for CRM: Pt's wife stated that the pt recently became a pt with Dr.Crawford and wants to confirm if the office received the pt's medical records from the previous office. Pt would like a callback with an update.  ---  Does not appear records have been received yet. TDW

## 2023-08-11 ENCOUNTER — Ambulatory Visit: Admitting: Urology

## 2023-09-01 ENCOUNTER — Telehealth: Payer: Self-pay | Admitting: Internal Medicine

## 2023-09-01 NOTE — Telephone Encounter (Signed)
 Patient's wife came by to update patient's medication list. She said his list is missing  -Glimepride 1 mg, 1 before breakfast or main meal -Pantoprazole  40 mg, take 1 every other day -Citalopram  10 mg, take 1 once a day

## 2023-09-02 NOTE — Telephone Encounter (Signed)
 I have updated in patients chart

## 2023-09-02 NOTE — Addendum Note (Signed)
 Addended by: ROSALVA LEX RAMAN on: 09/02/2023 09:15 AM   Modules accepted: Orders

## 2023-09-17 ENCOUNTER — Ambulatory Visit (INDEPENDENT_AMBULATORY_CARE_PROVIDER_SITE_OTHER)

## 2023-09-17 ENCOUNTER — Other Ambulatory Visit (INDEPENDENT_AMBULATORY_CARE_PROVIDER_SITE_OTHER)

## 2023-09-17 VITALS — BP 132/62 | HR 64 | Ht 71.0 in | Wt 188.4 lb

## 2023-09-17 DIAGNOSIS — Z23 Encounter for immunization: Secondary | ICD-10-CM

## 2023-09-17 DIAGNOSIS — Z1159 Encounter for screening for other viral diseases: Secondary | ICD-10-CM

## 2023-09-17 DIAGNOSIS — L570 Actinic keratosis: Secondary | ICD-10-CM | POA: Diagnosis not present

## 2023-09-17 DIAGNOSIS — D0462 Carcinoma in situ of skin of left upper limb, including shoulder: Secondary | ICD-10-CM | POA: Diagnosis not present

## 2023-09-17 DIAGNOSIS — X32XXXD Exposure to sunlight, subsequent encounter: Secondary | ICD-10-CM | POA: Diagnosis not present

## 2023-09-17 DIAGNOSIS — Z0000 Encounter for general adult medical examination without abnormal findings: Secondary | ICD-10-CM

## 2023-09-17 NOTE — Progress Notes (Signed)
 Subjective:   [name] is a 73 y.o. who presents for a Medicare Wellness preventive visit.  As a reminder, Annual Wellness Visits don't include a physical exam, and some assessments may be limited, especially if this visit is performed virtually. We may recommend an in-person follow-up visit with your provider if needed.  Visit Complete: In person  Persons Participating in Visit: Patient.  AWV Questionnaire: Yes: Patient Medicare AWV questionnaire was completed by the patient on 09/13/2023; I have confirmed that all information answered by patient is correct and no changes since this date.  Cardiac Risk Factors include: advanced age (>24men, >27 women);diabetes mellitus;dyslipidemia;hypertension;male gender     Objective:    Today's Vitals   09/17/23 1052  BP: 132/62  Pulse: 64  SpO2: 98%  Weight: 188 lb 6.4 oz (85.5 kg)  Height: 5' 11 (1.803 m)   Body mass index is 26.28 kg/m.     09/17/2023   10:37 AM 05/16/2020    6:02 AM 06/27/2018   10:23 AM 10/30/2015   11:52 PM  Advanced Directives  Does Patient Have a Medical Advance Directive? Yes Yes Yes No   Type of Estate agent of [place];Living will Healthcare Power of [place];Living will    Does patient want to make changes to medical advance directive? No - Patient declined No - Patient declined    Copy of Healthcare Power of Attorney in Chart? Yes - validated most recent copy scanned in chart (See row information) No - copy requested    Would patient like information on creating a medical advance directive?    No - patient declined information      Data saved with a previous flowsheet row definition    Current Medications (verified) Outpatient Encounter Medications as of 09/17/2023  Medication Sig   acetaminophen  (TYLENOL ) 500 MG tablet Take 1,000 mg by mouth every 8 (eight) hours as needed for moderate pain.    amoxicillin -clavulanate (AUGMENTIN ) 875-125 MG tablet TAKE 1 TABLET BY MOUTH  EVERY 12 HOURS   aspirin  EC 81 MG tablet Take 81 mg by mouth daily.   atorvastatin  (LIPITOR) 80 MG tablet Take 1 tablet (80 mg total) by mouth daily.   citalopram  (CELEXA ) 10 MG tablet Take 10 mg by mouth daily. Take 1 a day   finasteride  (PROSCAR ) 5 MG tablet Take 1 tablet (5 mg total) by mouth daily.   gabapentin  (NEURONTIN ) 300 MG capsule Take 600 mg by mouth at bedtime. And may take an additional 300 mg once a day if needed   glimepiride (AMARYL) 1 MG tablet Take 1 mg by mouth daily with breakfast.   nitroGLYCERIN  (NITROSTAT ) 0.4 MG SL tablet Place 1 tablet (0.4 mg total) under the tongue every 5 (five) minutes as needed for chest pain.   pantoprazole  (PROTONIX ) 20 MG tablet Take 20 mg by mouth daily. Take one every other day   Saw Palmetto, Serenoa repens, (SAW PALMETTO PO) Take 100 mg by mouth 2 (two) times daily.   traMADol  (ULTRAM ) 50 MG tablet Take by mouth as needed.   valsartan -hydrochlorothiazide  (DIOVAN -HCT) 320-12.5 MG tablet Take 1 tablet by mouth daily.   vitamin B-12 (CYANOCOBALAMIN) 1000 MCG tablet Take 1,000 mcg by mouth 3 (three) times a week.   No facility-administered encounter medications on file as of 09/17/2023.    Allergies (verified) Chicken allergy, Dicyclomine, Morphine, Other, Pineapple, Sulfa antibiotics, Amlodipine  besylate, Chlorthalidone, and Naproxen   History: Past Medical History:  Diagnosis Date   Anxiety    CAD (  coronary artery disease)    s/p Taxus DES to LAD in 2010 // Myoview  09/2018: low risk, normal EF    DM2 (diabetes mellitus, type 2) (HCC)    Hx of seasonal allergies    Hyperlipidemia    Hypertension    Past Surgical History:  Procedure Laterality Date   ANGIOPLASTY  2010   stent   CORONARY PRESSURE/FFR STUDY N/A 05/16/2020   Procedure: INTRAVASCULAR PRESSURE WIRE/FFR STUDY;  Surgeon: Dann Candyce RAMAN, MD;  Location: [facility] INVASIVE CV LAB;  Service: Cardiovascular;  Laterality: N/A;   CORONARY STENT INTERVENTION N/A 05/16/2020    Procedure: CORONARY STENT INTERVENTION;  Surgeon: Dann Candyce RAMAN, MD;  Location: [facility] INVASIVE CV LAB;  Service: Cardiovascular;  Laterality: N/A;   CORONARY ULTRASOUND/IVUS N/A 05/16/2020   Procedure: Intravascular Ultrasound/IVUS;  Surgeon: Dann Candyce RAMAN, MD;  Location[facility] INV[facility] CV LAB;  Service: Cardiovascular;  Laterality: N/A;   LEFT HEART CATH AND CORONARY ANGIOGRAPHY N/A 05/16/2020   Procedure: LEFT HEART CATH AND CORONARY ANGIOGRAPHY;  Surgeon: Dann Candyce RAMAN, MD;  Location: [facility] INVASIVE CV LAB;  Service: Cardiovascular;  Laterality: N/A;   LUMBAR LAMINECTOMY  1991   L3 and T12   Family History  Problem Relation Age of Onset   Breast cancer Mother    Lung cancer Father    Colon cancer Neg Hx    Stomach cancer Neg Hx    Esophageal cancer Neg Hx    Pancreatic cancer Neg Hx    Social History   Socioeconomic History   Marital status: Married    Spouse name: Not on file   Number of children: Not on file   Years of education: Not on file   Highest education level: 12th grade  Occupational History   Not on file  Tobacco Use   Smoking status: Never   Smokeless tobacco: Never  Vaping Use   Vaping status: Never Used  Substance and Sexual Activity   Alcohol use: Yes    Alcohol/week: 1.0 standard drink of alcohol    Types: 1 Cans of beer per week    Comment: beer once or twice a month   Drug use: No   Sexual activity: Yes  Other Topics Concern   Not on file  Social History Narrative   Married   Social Drivers of Health   Financial Resource Strain: Low Risk  (09/17/2023)   Overall Financial Resource Strain (CARDIA)    Difficulty of Paying Living Expenses: Not very hard  Food Insecurity: No Food Insecurity (09/17/2023)   Hunger Vital Sign    Worried About Running Out of Food in the Last Year: Never true    Ran Out of Food in the Last Year: Never true  Transportation Needs: No Transportation Needs (09/17/2023)   PRAPARE - Scientist, research (physical sciences) (Medical): No    Lack of Transportation (Non-Medical): No  Physical Activity: Sufficiently Active (09/17/2023)   Exercise Vital Sign    Days of Exercise per Week: 7 days    Minutes of Exercise per Session: 120 min  Stress: No Stress Concern Present (09/17/2023)   [employer] of Occupational Health - Occupational Stress Questionnaire    Feeling of Stress: Not at all  Social Connections: Socially Integrated (09/17/2023)   Social Connection and Isolation Panel    Frequency of Communication with Friends and Family: More than three times a week    Frequency of Social Gatherings with Friends and Family: More than three times a week  Attends Religious Services: More than 4 times per year    Active Member of Clubs or Organizations: Yes    Attends Banker Meetings: More than 4 times per year    Marital Status: Married    Tobacco Counseling Counseling given: No    Clinical Intake:  Pre-visit preparation completed: Yes  Pain : No/denies pain     BMI - recorded: 26.28 Nutritional Status: BMI 25 -29 Overweight Nutritional Risks: None Diabetes: Yes CBG done?: No Did pt. bring in CBG monitor from home?: No  Lab Results  Component Value Date   HGBA1C 6.1 06/27/2023     How often do you need to have someone help you when you read instructions, pamphlets, or other written materials from your doctor or pharmacy?: 1 - Never  Interpreter Needed?: No  Information entered by :: Verdie Saba, CMA   Activities of Daily Living     09/17/2023   10:38 AM 09/13/2023    9:51 PM  In your present state of health, do you have any difficulty performing the following activities:  Hearing? 0 0  Vision? 0 0  Difficulty concentrating or making decisions? 0 0  Walking or climbing stairs? 0 0  Dressing or bathing? 0 0  Doing errands, shopping? 0 0  Preparing Food and eating ? N N  Using the Toilet? N N  In the past six months, have you accidently leaked urine?  N N  Do you have problems with loss of bowel control? N N  Managing your Medications? N N  Managing your Finances? N N  Housekeeping or managing your Housekeeping? N N    Patient Care Team: Rollene Almarie LABOR, MD as PCP - General (Internal Medicine) Jeffrie Oneil BROCKS, MD as PCP - Cardiology (Cardiology) Maryjo Levander LABOR, OD (Optometry)  I have updated your Care Teams any recent Medical Services you may have received from other providers in the past year.     Assessment:   This is a routine wellness examination for [name].  Hearing/Vision screen Hearing Screening - Comments:: Denies hearing difficulties   Vision Screening - Comments:: Wears rx glasses - up to date with routine eye exams with  Dr Kathie   Goals Addressed               This Visit's Progress     Patient Stated (pt-stated)        Patient stated he plans to continue exercising and staying active       Depression Screen     09/17/2023   10:39 AM  PHQ 2/9 Scores  PHQ - 2 Score 0  PHQ- 9 Score 0    Fall Risk     09/17/2023   10:39 AM 09/13/2023    9:51 PM  Fall Risk   Falls in the past year? 0 0  Number falls in past yr: 0   Injury with Fall? 0   Risk for fall due to : No Fall Risks   Follow up Falls evaluation completed;Falls prevention discussed     MEDICARE RISK AT HOME:  Medicare Risk at Home Any stairs in or around the home?: Yes If so, are there any without handrails?: No Home free of loose throw rugs in walkways, pet beds, electrical cords, etc?: Yes Adequate lighting in your home to reduce risk of falls?: Yes Life alert?: No Use of a cane, walker or w/c?: No Grab bars in the bathroom?: Yes Shower chair or bench in shower?: Yes Elevated toilet  seat or a handicapped toilet?: Yes  TIMED UP AND GO:  Was the test performed?  No  Cognitive Function: 6CIT completed        09/17/2023   10:56 AM  6CIT Screen  What Year? 0 points  What month? 0 points  What time? 0 points  Count back from  20 0 points  Months in reverse 0 points  Repeat phrase 0 points  Total Score 0 points    Immunizations Immunization History  Administered Date(s) Administered   PNEUMOCOCCAL CONJUGATE-20 09/17/2023    Screening Tests Health Maintenance  Topic Date Due   OPHTHALMOLOGY EXAM  Never done   Hepatitis C Screening  Never done   DTaP/Tdap/Td (1 - Tdap) Never done   Zoster Vaccines- Shingrix (1 of 2) Never done   Colonoscopy  07/02/2021   COVID-19 Vaccine (1 - 2024-25 season) Never done   INFLUENZA VACCINE  09/19/2023   HEMOGLOBIN A1C  12/28/2023   Diabetic kidney evaluation - eGFR measurement  06/26/2024   Diabetic kidney evaluation - Urine ACR  06/26/2024   FOOT EXAM  06/26/2024   Medicare Annual Wellness (AWV)  09/16/2024   Pneumococcal Vaccine: 50+ Years  Completed   Hepatitis B Vaccines  Aged Out   HPV VACCINES  Aged Out   Meningococcal B Vaccine  Aged Out    Health Maintenance  Health Maintenance Due  Topic Date Due   OPHTHALMOLOGY EXAM  Never done   Hepatitis C Screening  Never done   DTaP/Tdap/Td (1 - Tdap) Never done   Zoster Vaccines- Shingrix (1 of 2) Never done   Colonoscopy  07/02/2021   COVID-19 Vaccine (1 - 2024-25 season) Never done   Health Maintenance Items Addressed:  Pneumovax vaccine given today; Patient will get the Tdap vaccine at local pharmacy; Ordered a Hepatitis C Screening   Additional Screening:  Vision Screening: Recommended annual ophthalmology exams for early detection of glaucoma and other disorders of the eye. Would you like a referral to an eye doctor? No    Dental Screening: Recommended annual dental exams for proper oral hygiene  Community Resource Referral / Chronic Care Management: CRR required this visit?  No   CCM required this visit?  No   Plan:    I have personally reviewed and noted the following in the patient's chart:   Medical and social history Use of alcohol, tobacco or illicit drugs  Current medications and  supplements including opioid prescriptions. Patient is currently taking opioid prescriptions. Information provided to patient regarding non-opioid alternatives. Patient advised to discuss non-opioid treatment plan with their provider. Functional ability and status Nutritional status Physical activity Advanced directives List of other physicians Hospitalizations, surgeries, and ER visits in previous 12 months Vitals Screenings to include cognitive, depression, and falls Referrals and appointments  In addition, I have reviewed and discussed with patient certain preventive protocols, quality metrics, and best practice recommendations. A written personalized care plan for preventive services as well as general preventive health recommendations were provided to patient.   Verdie CHRISTELLA Saba, CMA   09/17/2023   After Visit Summary: (In Person-Declined) Patient declined AVS at this time.  Notes: Scheduled a 4-mth Diabetes f/u appt w/PCP for 10/22/2023.

## 2023-09-17 NOTE — Patient Instructions (Signed)
 Mr. [name] , Thank you for taking time out of your busy schedule to complete your Annual Wellness Visit with me. I enjoyed our conversation and look forward to speaking with you again next year. I, as well as your care team,  appreciate your ongoing commitment to your health goals. Please review the following plan we discussed and let me know if I can assist you in the future. Your Game plan/ To Do List    Referrals: If you haven't heard from the office you've been referred to, please reach out to them at the phone provided.  Ordered a Hepatitis C Screening (lab) Follow up Visits: Next Medicare AWV with our clinical staff: 09/23/2024   Have you seen your provider in the last 6 months (3 months if uncontrolled diabetes)? Yes Next Office Visit with your provider: 10/21/2024  Clinician Recommendations:  Aim for 30 minutes of exercise or brisk walking, 6-8 glasses of water, and 5 servings of fruits and vegetables each day. Educated and advised on getting the Tdap, Shingles, and COVID vaccines in 2025.      This is a list of the screening recommended for you and due dates:  Health Maintenance  Topic Date Due   Eye exam for diabetics  Never done   Hepatitis C Screening  Never done   DTaP/Tdap/Td vaccine (1 - Tdap) Never done   Zoster (Shingles) Vaccine (1 of 2) Never done   Colon Cancer Screening  07/02/2021   COVID-19 Vaccine (1 - 2024-25 season) Never done   Flu Shot  09/19/2023   Hemoglobin A1C  12/28/2023   Yearly kidney function blood test for diabetes  06/26/2024   Yearly kidney health urinalysis for diabetes  06/26/2024   Complete foot exam   06/26/2024   Medicare Annual Wellness Visit  09/16/2024   Pneumococcal Vaccine for age over 55  Completed   Hepatitis B Vaccine  Aged Out   HPV Vaccine  Aged Out   Meningitis B Vaccine  Aged Out    Advanced directives: (In Chart) A copy of your advanced directives are scanned into your chart should your provider ever need it. Advance Care  Planning is important because it:  [x]  Makes sure you receive the medical care that is consistent with your values, goals, and preferences  [x]  It provides guidance to your family and loved ones and reduces their decisional burden about whether or not they are making the right decisions based on your wishes.  Follow the link provided in your after visit summary or read over the paperwork we have mailed to you to help you started getting your Advance Directives in place. If you need assistance in completing these, please reach out to us  so that we can help you!   Managing Pain Without Opioids Opioids are strong medicines used to treat moderate to severe pain. For some people, especially those who have long-term (chronic) pain, opioids may not be the best choice for pain management due to: Side effects like nausea, constipation, and sleepiness. The risk of addiction (opioid use disorder). The longer you take opioids, the greater your risk of addiction. Pain that lasts for more than 3 months is called chronic pain. Managing chronic pain usually requires more than one approach and is often provided by a team of health care providers working together (multidisciplinary approach). Pain management may be done at a pain management center or pain clinic. How to manage pain without the use of opioids Use non-opioid medicines Non-opioid medicines for pain may  include: Over-the-counter or prescription non-steroidal anti-inflammatory drugs (NSAIDs). These may be the first medicines used for pain. They work well for muscle and bone pain, and they reduce swelling. Acetaminophen . This over-the-counter medicine may work well for milder pain but not swelling. Antidepressants. These may be used to treat chronic pain. A certain type of antidepressant (tricyclics) is often used. These medicines are given in lower doses for pain than when used for depression. Anticonvulsants. These are usually used to treat seizures  but may also reduce nerve (neuropathic) pain. Muscle relaxants. These relieve pain caused by sudden muscle tightening (spasms). You may also use a pain medicine that is applied to the skin as a patch, cream, or gel (topical analgesic), such as a numbing medicine. These may cause fewer side effects than medicines taken by mouth. Do certain therapies as directed Some therapies can help with pain management. They include: Physical therapy. You will do exercises to gain strength and flexibility. A physical therapist may teach you exercises to move and stretch parts of your body that are weak, stiff, or painful. You can learn these exercises at physical therapy visits and practice them at home. Physical therapy may also involve: Massage. Heat wraps or applying heat or cold to affected areas. Electrical signals that interrupt pain signals (transcutaneous electrical nerve stimulation, TENS). Weak lasers that reduce pain and swelling (low-level laser therapy). Signals from your body that help you learn to regulate pain (biofeedback). Occupational therapy. This helps you to learn ways to function at home and work with less pain. Recreational therapy. This involves trying new activities or hobbies, such as a physical activity or drawing. Mental health therapy, including: Cognitive behavioral therapy (CBT). This helps you learn coping skills for dealing with pain. Acceptance and commitment therapy (ACT) to change the way you think and react to pain. Relaxation therapies, including muscle relaxation exercises and mindfulness-based stress reduction. Pain management counseling. This may be individual, family, or group counseling.  Receive medical treatments Medical treatments for pain management include: Nerve block injections. These may include a pain blocker and anti-inflammatory medicines. You may have injections: Near the spine to relieve chronic back or neck pain. Into joints to relieve back or joint  pain. Into nerve areas that supply a painful area to relieve body pain. Into muscles (trigger point injections) to relieve some painful muscle conditions. A medical device placed near your spine to help block pain signals and relieve nerve pain or chronic back pain (spinal cord stimulation device). Acupuncture. Follow these instructions at home Medicines Take over-the-counter and prescription medicines only as told by your health care provider. If you are taking pain medicine, ask your health care providers about possible side effects to watch out for. Do not drive or use heavy machinery while taking prescription opioid pain medicine. Lifestyle  Do not use drugs or alcohol to reduce pain. If you drink alcohol, limit how much you have to: 0-1 drink a day for women who are not pregnant. 0-2 drinks a day for men. Know how much alcohol is in a drink. In the [place] one drink equals one 12 oz bottle of beer (355 mL), one 5 oz glass of wine (148 mL), or one 1 oz glass of hard liquor (44 mL). Do not use any products that contain nicotine or tobacco. These products include cigarettes, chewing tobacco, and vaping devices, such as e-cigarettes. If you need help quitting, ask your health care provider. Eat a healthy diet and maintain a healthy weight. Poor diet and excess  weight may make pain worse. Eat foods that are high in fiber. These include fresh fruits and vegetables, whole grains, and beans. Limit foods that are high in fat and processed sugars, such as fried and sweet foods. Exercise regularly. Exercise lowers stress and may help relieve pain. Ask your health care provider what activities and exercises are safe for you. If your health care provider approves, join an exercise class that combines movement and stress reduction. Examples include yoga and tai chi. Get enough sleep. Lack of sleep may make pain worse. Lower stress as much as possible. Practice stress reduction techniques as told by  your therapist. General instructions Work with all your pain management providers to find the treatments that work best for you. You are an important member of your pain management team. There are many things you can do to reduce pain on your own. Consider joining an online or in-person support group for people who have chronic pain. Keep all follow-up visits. This is important. Where to find more information You can find more information about managing pain without opioids from: American Academy of Pain Medicine: painmed.org Institute for Chronic Pain: instituteforchronicpain.org American Chronic Pain Association: theacpa.org Contact a health care provider if: You have side effects from pain medicine. Your pain gets worse or does not get better with treatments or home therapy. You are struggling with anxiety or depression. Summary Many types of pain can be managed without opioids. Chronic pain may respond better to pain management without opioids. Pain is best managed when you and a team of health care providers work together. Pain management without opioids may include non-opioid medicines, medical treatments, physical therapy, mental health therapy, and lifestyle changes. Tell your health care providers if your pain gets worse or is not being managed well enough. This information is not intended to replace advice given to you by your health care provider. Make sure you discuss any questions you have with your health care provider. Document Revised: 05/17/2020 Document Reviewed: 05/17/2020 Elsevier Patient Education  2024 [employer].

## 2023-09-18 ENCOUNTER — Other Ambulatory Visit: Payer: Self-pay | Admitting: Internal Medicine

## 2023-09-18 LAB — HEPATITIS C ANTIBODY: Hepatitis C Ab: NONREACTIVE

## 2023-09-18 NOTE — Telephone Encounter (Signed)
 Copied from CRM [id number]. Topic: Clinical - Medication Refill >> Sep 18, 2023  8:52 AM Deaijah H wrote: Medication: gabapentin  (NEURONTIN ) 300 MG capsule  Has the patient contacted their pharmacy? Yes (Agent: If no, request that the patient contact the pharmacy for the refill. If patient does not wish to contact the pharmacy document the reason why and proceed with request.) (Agent: If yes, when and what did the pharmacy advise?) To contact doctor  This is the patient's preferred pharmacy:  [facility] [address] [place], [place] - [address] [place] [facility] [address] [place] [address] Phone: [phone] Fax: [phone]   Is this the correct pharmacy for this prescription? Yes If no, delete pharmacy and type the correct one.   Has the prescription been filled recently? No  Is the patient out of the medication? No , 2 more left  Has the patient been seen for an appointment in the last year OR does the patient have an upcoming appointment? Yes  Can we respond through MyChart? Yes  Agent: Please be advised that Rx refills may take up to 3 business days. We ask that you follow-up with your pharmacy.

## 2023-09-19 ENCOUNTER — Ambulatory Visit: Payer: Self-pay | Admitting: Internal Medicine

## 2023-09-19 LAB — HM HEPATITIS C SCREENING LAB: HM Hepatitis Screen: NEGATIVE

## 2023-09-21 ENCOUNTER — Other Ambulatory Visit: Payer: Self-pay | Admitting: Urology

## 2023-09-21 DIAGNOSIS — R319 Hematuria, unspecified: Secondary | ICD-10-CM

## 2023-10-06 ENCOUNTER — Telehealth: Payer: Self-pay | Admitting: Internal Medicine

## 2023-10-06 ENCOUNTER — Ambulatory Visit: Admitting: Internal Medicine

## 2023-10-06 DIAGNOSIS — E785 Hyperlipidemia, unspecified: Secondary | ICD-10-CM

## 2023-10-06 DIAGNOSIS — I25118 Atherosclerotic heart disease of native coronary artery with other forms of angina pectoris: Secondary | ICD-10-CM

## 2023-10-06 DIAGNOSIS — I10 Essential (primary) hypertension: Secondary | ICD-10-CM

## 2023-10-06 DIAGNOSIS — R001 Bradycardia, unspecified: Secondary | ICD-10-CM

## 2023-10-06 NOTE — Telephone Encounter (Signed)
 Ok to fill

## 2023-10-06 NOTE — Telephone Encounter (Signed)
 Prescription Request  10/06/2023  LOV: 06/27/2023  What is the name of the medication or equipment? Tramadol  50 mg 1 tablet by mouth, every 8 hours.Gabapentin  300 Mg. 2 capsules by mouth at night, 1 capsule during the day.  Have you contacted your pharmacy to request a refill? Yes   Which pharmacy would you like this sent to?    Patient notified that their request is being sent to the clinical staff for review and that they should receive a response within 2 business days.   Please advise at Mobile [phone] (mobile)

## 2023-10-07 MED ORDER — TRAMADOL HCL 50 MG PO TABS
50.0000 mg | ORAL_TABLET | Freq: Every day | ORAL | 0 refills | Status: DC | PRN
Start: 2023-10-07 — End: 2023-12-30

## 2023-10-07 MED ORDER — GABAPENTIN 300 MG PO CAPS: 600.0000 mg | ORAL_CAPSULE | Freq: Every day | ORAL | 5 refills | Status: AC

## 2023-10-07 NOTE — Telephone Encounter (Signed)
 Sent in

## 2023-10-22 ENCOUNTER — Ambulatory Visit: Admitting: Internal Medicine

## 2023-10-28 ENCOUNTER — Ambulatory Visit: Admitting: Internal Medicine

## 2023-11-03 DIAGNOSIS — R051 Acute cough: Secondary | ICD-10-CM | POA: Diagnosis not present

## 2023-11-03 DIAGNOSIS — R0902 Hypoxemia: Secondary | ICD-10-CM | POA: Diagnosis not present

## 2023-11-03 DIAGNOSIS — R509 Fever, unspecified: Secondary | ICD-10-CM | POA: Diagnosis not present

## 2023-11-04 ENCOUNTER — Telehealth: Payer: Self-pay | Admitting: Radiology

## 2023-11-04 NOTE — Telephone Encounter (Signed)
 Copied from CRM [id number]. Topic: Clinical - Medical Advice >> Nov 04, 2023  1:47 PM Anairis L wrote: Reason for CRM: 08/03/2023 Pt went to urgent care and got dx with pneumonia.Wife wants to know if Dr. Rollene would like to see x-rays. Please give her a call.

## 2023-11-11 ENCOUNTER — Ambulatory Visit (INDEPENDENT_AMBULATORY_CARE_PROVIDER_SITE_OTHER): Admitting: Internal Medicine

## 2023-11-11 ENCOUNTER — Encounter: Payer: Self-pay | Admitting: Internal Medicine

## 2023-11-11 VITALS — BP 140/76 | HR 42 | Temp 97.6°F | Resp 18 | Ht 71.0 in | Wt 193.6 lb

## 2023-11-11 DIAGNOSIS — I10 Essential (primary) hypertension: Secondary | ICD-10-CM

## 2023-11-11 DIAGNOSIS — J189 Pneumonia, unspecified organism: Secondary | ICD-10-CM | POA: Diagnosis not present

## 2023-11-11 DIAGNOSIS — E118 Type 2 diabetes mellitus with unspecified complications: Secondary | ICD-10-CM

## 2023-11-11 LAB — POCT GLYCOSYLATED HEMOGLOBIN (HGB A1C): Hemoglobin A1C: 6.4 % — AB (ref 4.0–5.6)

## 2023-11-11 NOTE — Progress Notes (Signed)
   Subjective:   Patient ID: [name], male    DOB: [birth year], 73 y.o.   MRN: [id number]  Discussed the use of AI scribe software for clinical note transcription with the patient, who gave verbal consent to proceed.  History of Present Illness [name] is a 73 year old male who presents with recent pneumonia.  He was diagnosed with pneumonia last week after experiencing a severe cough and feeling unwell. Chest x-rays confirmed the diagnosis, and he was prescribed moxifloxacin, which he completed yesterday. He also received a cough syrup containing codeine, which he has not taken today due to significant drowsiness.  He experienced chest pressure that was initially thought to be cardiac-related. He continues to feel weak and tired, and is concerned about the duration of these symptoms.  His A1c is currently 6.4 checked during visit. managed with dietary changes including switching to keto bread, resulting in some weight loss. He monitors his blood sugar levels and noted a low of 88 when he had not eaten all day. He is making efforts to eat more fruits and vegetables while avoiding highly processed foods.  No recent low blood sugars except for one instance when he had not eaten all day. He has a persistent cough.  Review of Systems  Constitutional:  Positive for fatigue.  HENT: Negative.    Eyes: Negative.   Respiratory:  Positive for cough. Negative for chest tightness and shortness of breath.   Cardiovascular:  Negative for chest pain, palpitations and leg swelling.  Gastrointestinal:  Negative for abdominal distention, abdominal pain, constipation, diarrhea, nausea and vomiting.  Musculoskeletal: Negative.   Skin: Negative.   Neurological: Negative.   Psychiatric/Behavioral: Negative.      Objective:  Physical Exam Constitutional:      Appearance: He is well-developed.  HENT:     Head: Normocephalic and atraumatic.  Cardiovascular:     Rate and Rhythm:  Normal rate and regular rhythm.  Pulmonary:     Effort: Pulmonary effort is normal. No respiratory distress.     Breath sounds: Normal breath sounds. No wheezing or rales.  Abdominal:     General: Bowel sounds are normal. There is no distension.     Palpations: Abdomen is soft.     Tenderness: There is no abdominal tenderness.  Musculoskeletal:     Cervical back: Normal range of motion.  Skin:    General: Skin is warm and dry.  Neurological:     Mental Status: He is alert and oriented to person, place, and time.     Coordination: Coordination normal.     Vitals:   11/11/23 0945  BP: (!) 140/76  Pulse: (!) 42  Resp: 18  Temp: 97.6 F (36.4 C)  SpO2: 95%  Weight: 193 lb 9.6 oz (87.8 kg)  Height: 5' 11 (1.803 m)    Assessment and Plan Assessment & Plan Pneumonia, left lower lobe   Diagnosed with left lower lobe pneumonia last week, treated with moxifloxacin and codeine-containing cough syrup. Symptoms have improved, but weakness and fatigue persist.  Monitor symptoms for improvement over the next couple of weeks. Contact the clinic if symptoms worsen or no improvement is noted.  Type 2 diabetes mellitus   A1c is 6.4, indicating good glycemic control with no recent hypoglycemic episodes. Diet management is effective. Continue current diabetes management plan.

## 2023-11-11 NOTE — Assessment & Plan Note (Signed)
 POC HGA1c done today and 6.4. Continue amaryl 1 mg daily and no low blood sugars. Follow up 6 months.

## 2023-11-11 NOTE — Assessment & Plan Note (Signed)
 He has finished 1 week moxifloxacin and is improving. Some cough and fatigue remain. Monitor for improvement. Call if worsening.

## 2023-11-11 NOTE — Assessment & Plan Note (Signed)
 BP is borderline today likely due to otc cold products for recent pneumonia. Previously at goal and will continue same valsartan /hydrochlorothiazide  for now.

## 2023-11-11 NOTE — Patient Instructions (Signed)
 Your HgA1c today is 6.4

## 2023-11-16 ENCOUNTER — Other Ambulatory Visit: Payer: Self-pay | Admitting: Urology

## 2023-11-16 DIAGNOSIS — R319 Hematuria, unspecified: Secondary | ICD-10-CM

## 2023-11-25 ENCOUNTER — Encounter: Payer: Self-pay | Admitting: Physician Assistant

## 2023-11-28 ENCOUNTER — Telehealth: Payer: Self-pay

## 2023-11-28 NOTE — Telephone Encounter (Signed)
 Copied from CRM [id number]. Topic: Clinical - Lab/Test Results >> Nov 28, 2023  9:21 AM Vena HERO wrote: Reason for CRM: Willie from [facility] called in to ask if we received pts FIT test results she faxed over yesterday afternoon. Test results were positive and she needs to move forward with next steps with pt. Please call Shanell at [phone] with questions/concerns or if results have not been received.

## 2023-12-03 ENCOUNTER — Ambulatory Visit: Admitting: Urology

## 2023-12-03 VITALS — BP 164/57 | HR 50

## 2023-12-03 DIAGNOSIS — N138 Other obstructive and reflux uropathy: Secondary | ICD-10-CM | POA: Diagnosis not present

## 2023-12-03 DIAGNOSIS — R3912 Poor urinary stream: Secondary | ICD-10-CM | POA: Diagnosis not present

## 2023-12-03 DIAGNOSIS — N390 Urinary tract infection, site not specified: Secondary | ICD-10-CM

## 2023-12-03 DIAGNOSIS — N35919 Unspecified urethral stricture, male, unspecified site: Secondary | ICD-10-CM | POA: Diagnosis not present

## 2023-12-03 DIAGNOSIS — N401 Enlarged prostate with lower urinary tract symptoms: Secondary | ICD-10-CM | POA: Diagnosis not present

## 2023-12-03 DIAGNOSIS — Z87440 Personal history of urinary (tract) infections: Secondary | ICD-10-CM

## 2023-12-03 DIAGNOSIS — R319 Hematuria, unspecified: Secondary | ICD-10-CM

## 2023-12-03 LAB — URINALYSIS, ROUTINE W REFLEX MICROSCOPIC
Bilirubin, UA: NEGATIVE
Ketones, UA: NEGATIVE
Leukocytes,UA: NEGATIVE
Nitrite, UA: NEGATIVE
Protein,UA: NEGATIVE
RBC, UA: NEGATIVE
Specific Gravity, UA: 1.025 (ref 1.005–1.030)
Urobilinogen, Ur: 0.2 mg/dL (ref 0.2–1.0)
pH, UA: 5.5 (ref 5.0–7.5)

## 2023-12-03 MED ORDER — TAMSULOSIN HCL 0.4 MG PO CAPS: 0.4000 mg | ORAL_CAPSULE | Freq: Every day | ORAL | 3 refills | Status: AC

## 2023-12-03 MED ORDER — FINASTERIDE 5 MG PO TABS: 5.0000 mg | ORAL_TABLET | Freq: Every day | ORAL | 3 refills | Status: AC

## 2023-12-03 MED ORDER — AMOXICILLIN-POT CLAVULANATE 875-125 MG PO TABS: 1.0000 | ORAL_TABLET | Freq: Two times a day (BID) | ORAL | 11 refills | Status: DC

## 2023-12-03 NOTE — Telephone Encounter (Signed)
 We have not received but I will call them and inform them to resend

## 2023-12-03 NOTE — Progress Notes (Signed)
 12/03/2023 11:17 AM   [name] 08-23-1950 [id number]  Referring provider: Signa Rush, MD [address] [place],  [place] [address]  Followup BPH and urethral stricture   HPI: [name] is a 73yo here for followup for BPH, frequent UTI and urethral stricture disease. He has taken antibiotics for a UTI 3-4 times times last visit. He uses 16 french catheters prn and passes the catheter easily. IPSS 12 QOL 1 on flomax  0.4mg  daily and finasteride  5mg  daily. Uirne stream mostly strong. No straining to urinate. No other complaints today   PMH: Past Medical History:  Diagnosis Date   Anxiety    CAD (coronary artery disease)    s/p Taxus DES to LAD in 2010 // Myoview  09/2018: low risk, normal EF    DM2 (diabetes mellitus, type 2) (HCC)    Hx of seasonal allergies    Hyperlipidemia    Hypertension     Surgical History: Past Surgical History:  Procedure Laterality Date   ANGIOPLASTY  2010   stent   CORONARY PRESSURE/FFR STUDY N/A 05/16/2020   Procedure: INTRAVASCULAR PRESSURE WIRE/FFR STUDY;  Surgeon: Dann Candyce RAMAN, MD;  Location: [facility] INVASIVE CV LAB;  Service: Cardiovascular;  Laterality: N/A;   CORONARY STENT INTERVENTION N/A 05/16/2020   Procedure: CORONARY STENT INTERVENTION;  Surgeon: Dann Candyce RAMAN, MD;  Location: [facility] INVASIVE CV LAB;  Service: Cardiovascular;  Laterality: N/A;   CORONARY ULTRASOUND/IVUS N/A 05/16/2020   Procedure: Intravascular Ultrasound/IVUS;  Surgeon: Dann Candyce RAMAN, MD;  Location: [facility] INVASIVE CV LAB;  Service: Cardiovascular;  Laterality: N/A;   LEFT HEART CATH AND CORONARY ANGIOGRAPHY N/A 05/16/2020   Procedure: LEFT HEART CATH AND CORONARY ANGIOGRAPHY;  Surgeon: Dann Candyce RAMAN, MD;  Location: [facility] INVASIVE CV LAB;  Service: Cardiovascular;  Laterality: N/A;   LUMBAR LAMINECTOMY  1991   L3 and T12    Home Medications:  Allergies as of 12/03/2023       Reactions   Chicken Allergy Shortness Of Breath,  Nausea And Vomiting, Other (See Comments)   Headache SAME REACTION(S) TO MALAWI!!!!   Dicyclomine Hives   Morphine Shortness Of Breath   Other Anaphylaxis   Ate a fruit salad and had to go to the E.R.   Pineapple Anaphylaxis, Rash   Sulfa Antibiotics Shortness Of Breath   Headache SAME REACTION(S) TO MALAWI!!!!   Amlodipine  Besylate    Other reaction(s): dizziness and edema   Chlorthalidone    Other reaction(s): headache   Naproxen Rash   Other reaction(s): pseudoporphyria        Medication List        Accurate as of December 03, 2023 11:17 AM. If you have any questions, ask your nurse or doctor.          acetaminophen  500 MG tablet Commonly known as: TYLENOL  Take 1,000 mg by mouth every 8 (eight) hours as needed for moderate pain.   aspirin  EC 81 MG tablet Take 81 mg by mouth daily.   atorvastatin  80 MG tablet Commonly known as: LIPITOR Take 1 tablet (80 mg total) by mouth daily.   citalopram  10 MG tablet Commonly known as: CELEXA  Take 10 mg by mouth daily. Take 1 a day   cyanocobalamin 1000 MCG tablet Commonly known as: VITAMIN B12 Take 1,000 mcg by mouth 3 (three) times a week.   finasteride  5 MG tablet Commonly known as: PROSCAR  Take 1 tablet by mouth once daily   gabapentin  300 MG capsule Commonly known as: NEURONTIN  Take  2 capsules (600 mg total) by mouth at bedtime. And may take an additional 300 mg once a day if needed   glimepiride 1 MG tablet Commonly known as: AMARYL Take 1 mg by mouth daily with breakfast.   nitroGLYCERIN  0.4 MG SL tablet Commonly known as: NITROSTAT  Place 1 tablet (0.4 mg total) under the tongue every 5 (five) minutes as needed for chest pain.   pantoprazole  20 MG tablet Commonly known as: PROTONIX  Take 20 mg by mouth daily. Take one every other day   promethazine-dextromethorphan 6.25-15 MG/5ML syrup Commonly known as: PROMETHAZINE-DM Take 5 mLs by mouth every 6 (six) hours as needed.   SAW PALMETTO PO Take 100  mg by mouth 2 (two) times daily.   tamsulosin  0.4 MG Caps capsule Commonly known as: FLOMAX  Take 1 capsule by mouth at bedtime   traMADol  50 MG tablet Commonly known as: ULTRAM  Take 1 tablet (50 mg total) by mouth daily as needed.   valsartan -hydrochlorothiazide  320-12.5 MG tablet Commonly known as: DIOVAN -HCT Take 1 tablet by mouth daily.        Allergies:  Allergies  Allergen Reactions   Chicken Allergy Shortness Of Breath, Nausea And Vomiting and Other (See Comments)    Headache SAME REACTION(S) TO MALAWI!!!!   Dicyclomine Hives   Morphine Shortness Of Breath   Other Anaphylaxis    Ate a fruit salad and had to go to the E.R.   Pineapple Anaphylaxis and Rash   Sulfa Antibiotics Shortness Of Breath    Headache SAME REACTION(S) TO MALAWI!!!!   Amlodipine  Besylate     Other reaction(s): dizziness and edema   Chlorthalidone     Other reaction(s): headache   Naproxen Rash    Other reaction(s): pseudoporphyria    Family History: Family History  Problem Relation Age of Onset   Breast cancer Mother    Lung cancer Father    Colon cancer Neg Hx    Stomach cancer Neg Hx    Esophageal cancer Neg Hx    Pancreatic cancer Neg Hx     Social History:  reports that he has never smoked. He has never used smokeless tobacco. He reports current alcohol use of about 1.0 standard drink of alcohol per week. He reports that he does not use drugs.  ROS: All other review of systems were reviewed and are negative except what is noted above in HPI  Physical Exam: BP (!) 164/57   Pulse (!) 50   Constitutional:  Alert and oriented, No acute distress. HEENT: [place] AT, moist mucus membranes.  Trachea midline, no masses. Cardiovascular: No clubbing, cyanosis, or edema. Respiratory: Normal respiratory effort, no increased work of breathing. GI: Abdomen is soft, nontender, nondistended, no abdominal masses GU: No CVA tenderness.  Lymph: No cervical or inguinal lymphadenopathy. Skin: No  rashes, bruises or suspicious lesions. Neurologic: Grossly intact, no focal deficits, moving all 4 extremities. Psychiatric: Normal mood and affect.  Laboratory Data: Lab Results  Component Value Date   WBC 4.7 06/27/2023   HGB 13.9 06/27/2023   HCT 40.1 06/27/2023   MCV 93.3 06/27/2023   PLT 101.0 (L) 06/27/2023    Lab Results  Component Value Date   CREATININE 1.13 06/27/2023    No results found for: PSA  No results found for: TESTOSTERONE  Lab Results  Component Value Date   HGBA1C 6.4 (A) 11/11/2023    Urinalysis    Component Value Date/Time   COLORURINE YELLOW 06/27/2018 1120   APPEARANCEUR Clear 07/03/2022 1533   LABSPEC  1.011 06/27/2018 1120   PHURINE 5.0 06/27/2018 1120   GLUCOSEU Negative 07/03/2022 1533   HGBUR NEGATIVE 06/27/2018 1120   BILIRUBINUR Negative 07/03/2022 1533   KETONESUR NEGATIVE 06/27/2018 1120   PROTEINUR Negative 07/03/2022 1533   PROTEINUR NEGATIVE 06/27/2018 1120   NITRITE Negative 07/03/2022 1533   NITRITE NEGATIVE 06/27/2018 1120   LEUKOCYTESUR Trace (A) 07/03/2022 1533   LEUKOCYTESUR NEGATIVE 06/27/2018 1120    Lab Results  Component Value Date   LABMICR See below: 07/03/2022   WBCUA 0-5 07/03/2022   LABEPIT 0-10 07/03/2022   MUCUS Present 08/24/2021   BACTERIA None seen 07/03/2022    Pertinent Imaging:  Results for orders placed in visit on 05/14/21  DG Abd 1 View  Narrative CLINICAL DATA:  Kidney stones  EXAM: ABDOMEN - 1 VIEW  COMPARISON:  CT 02/08/2018.  KUB 07/12/2019  FINDINGS: The bowel gas pattern is normal. No radio-opaque calculi or other significant radiographic abnormality are seen. Postoperative changes at the thoracolumbar and lumbosacral junctions.  IMPRESSION: Negative.   Electronically Signed By: Franky Crease M.D. On: 05/16/2021 09:08  No results found for this or any previous visit.  No results found for this or any previous visit.  No results found for this or any previous  visit.  Results for orders placed during the hospital encounter of 01/30/12  US  Renal  Narrative *RADIOLOGY REPORT*  Clinical Data:  Lower back and abdominal pain.  Pyelonephritis. History of kidney stones.  RENAL/URINARY TRACT ULTRASOUND COMPLETE  Comparison:  None.  Findings:  Right Kidney:  Right kidney is normal in size and echogenicity with no mass or hydronephrosis.  The right kidney measures 11.6 cm.  Left Kidney:  Normal in size and parenchymal echogenicity.  No evidence of mass or hydronephrosis.  The left kidney measures 10.9 cm.  Bladder:  Appears normal for degree of bladder distention.  IMPRESSION: Normal study.   Original Report Authenticated By: Alm Parkins, M.D.  No results found for this or any previous visit.  No results found for this or any previous visit.  Results for orders placed in visit on 07/11/21  CT RENAL STONE STUDY  Narrative CLINICAL DATA:  Flank pain  EXAM: CT ABDOMEN AND PELVIS WITHOUT CONTRAST  TECHNIQUE: Multidetector CT imaging of the abdomen and pelvis was performed following the standard protocol without IV contrast.  RADIATION DOSE REDUCTION: This exam was performed according to the departmental dose-optimization program which includes automated exposure control, adjustment of the mA and/or kV according to patient size and/or use of iterative reconstruction technique.  COMPARISON:  CT abdomen and pelvis dated February 08, 2018  FINDINGS: Lower chest: Coronary artery calcifications. Mild bibasilar atelectasis of the partially visualized lung bases.  Hepatobiliary: No focal liver abnormality is seen. No gallstones, gallbladder wall thickening, or biliary dilatation.  Pancreas: Unremarkable. No pancreatic ductal dilatation or surrounding inflammatory changes.  Spleen: Normal in size without focal abnormality.  Adrenals/Urinary Tract: Bilateral adrenal glands are unremarkable. No hydronephrosis or  nephrolithiasis. Bladder is unremarkable.  Stomach/Bowel: Stomach is within normal limits. Appendix appears normal. No evidence of bowel wall thickening, distention, or inflammatory changes.  Vascular/Lymphatic: Aortic atherosclerosis. No enlarged abdominal or pelvic lymph nodes.  Reproductive: Mild prostatomegaly.  Other: No abdominal wall hernia or abnormality. No abdominopelvic ascites.  Musculoskeletal: Posterior rods at T11-T12 and posterior fusion of L5-S1. Unchanged compression deformities at L1 and T12.  IMPRESSION: 1. No acute findings in the abdomen or pelvis including no evidence of hydronephrosis or nephroureterolithiasis. 2.  Aortic Atherosclerosis (  ICD10-I70.0).   Electronically Signed By: Rea Marc M.D. On: 07/11/2021 10:56   Assessment & Plan:    1. Benign prostatic hyperplasia with urinary obstruction (Primary) Continue flomax  0.4mg  daily - Urinalysis, Routine w reflex microscopic  2. Weak urinary stream Flomax  0.4mg  daily  3. Frequent UTI Continue Self start therapy, augmentin   4. Urethral stricture -continue self dilation prn 24french straight catheter   No follow-ups on file.  Belvie Clara, MD  [facility] [place]

## 2023-12-03 NOTE — Telephone Encounter (Signed)
 I have called shanell back and lvm stating to refax us  the test back over as we have not received this

## 2023-12-09 ENCOUNTER — Encounter: Payer: Self-pay | Admitting: Urology

## 2023-12-09 NOTE — Patient Instructions (Signed)

## 2023-12-23 ENCOUNTER — Telehealth: Payer: Self-pay | Admitting: Internal Medicine

## 2023-12-23 NOTE — Telephone Encounter (Signed)
 Patient forgot to let Dr. Rollene know about his nasal spray that was prescribed by his previous PCP.  Prescription Request  12/23/2023  LOV: 11/11/2023  What is the name of the medication or equipment? Fluticasone propionate nasal spray 50 mcg per spray  Have you contacted your pharmacy to request a refill? Yes   Which pharmacy would you like this sent to?  [facility] [address] [place], [place] - [address] [address] [place] [address] Phone: [phone] Fax: [phone]   Patient notified that their request is being sent to the clinical staff for review and that they should receive a response within 2 business days.   Please advise at Mobile [phone] (mobile)

## 2023-12-24 MED ORDER — FLUTICASONE PROPIONATE 50 MCG/ACT NA SUSP
2.0000 | Freq: Every day | NASAL | 3 refills | Status: AC
Start: 2023-12-24 — End: ?

## 2023-12-24 NOTE — Telephone Encounter (Signed)
 Received. Pt is aware.

## 2023-12-24 NOTE — Telephone Encounter (Signed)
 Sent in for him

## 2023-12-24 NOTE — Addendum Note (Signed)
 Addended by: ROLLENE NORRIS A on: 12/24/2023 09:30 AM   Modules accepted: Orders

## 2023-12-24 NOTE — Telephone Encounter (Signed)
 Pt requesting nasal spray from previous pcp sent in, I do not see on medication list

## 2023-12-26 ENCOUNTER — Other Ambulatory Visit: Payer: Self-pay | Admitting: Internal Medicine

## 2023-12-26 DIAGNOSIS — I10 Essential (primary) hypertension: Secondary | ICD-10-CM

## 2023-12-26 DIAGNOSIS — I25118 Atherosclerotic heart disease of native coronary artery with other forms of angina pectoris: Secondary | ICD-10-CM

## 2023-12-26 DIAGNOSIS — E785 Hyperlipidemia, unspecified: Secondary | ICD-10-CM

## 2023-12-26 DIAGNOSIS — R001 Bradycardia, unspecified: Secondary | ICD-10-CM

## 2023-12-31 ENCOUNTER — Other Ambulatory Visit: Payer: Self-pay | Admitting: Internal Medicine

## 2023-12-31 NOTE — Telephone Encounter (Signed)
 Copied from CRM [id number]. Topic: Clinical - Medication Refill >> Dec 31, 2023 10:59 AM Roselie BROCKS wrote: Medication: citalopram  (CELEXA ) 10 MG tablet  Has the patient contacted their pharmacy? No (Agent: If no, request that the patient contact the pharmacy for the refill. If patient does not wish to contact the pharmacy document the reason why and proceed with request.) (Agent: If yes, when and what did the pharmacy advise?)  This is the patient's preferred pharmacy:  [facility] [address] [place], [place] - [address] [address] [facility] [address] [place] [address] Phone: [phone] Fax: [phone]  Is this the correct pharmacy for this prescription? Yes If no, delete pharmacy and type the correct one.   Has the prescription been filled recently? No  Is the patient out of the medication? No 2 pills left  Has the patient been seen for an appointment in the last year OR does the patient have an upcoming appointment? Yes  Can we respond through MyChart? Yes  Agent: Please be advised that Rx refills may take up to 3 business days. We ask that you follow-up with your pharmacy.

## 2024-01-01 MED ORDER — CITALOPRAM HYDROBROMIDE 10 MG PO TABS: 10.0000 mg | ORAL_TABLET | Freq: Every day | ORAL | 3 refills | Status: AC

## 2024-01-13 ENCOUNTER — Encounter: Payer: Self-pay | Admitting: Physician Assistant

## 2024-01-13 ENCOUNTER — Encounter: Payer: Self-pay | Admitting: Gastroenterology

## 2024-01-13 ENCOUNTER — Ambulatory Visit (INDEPENDENT_AMBULATORY_CARE_PROVIDER_SITE_OTHER): Payer: Self-pay | Admitting: Physician Assistant

## 2024-01-13 VITALS — BP 122/60 | HR 52 | Ht 71.0 in | Wt 191.0 lb

## 2024-01-13 DIAGNOSIS — K2100 Gastro-esophageal reflux disease with esophagitis, without bleeding: Secondary | ICD-10-CM

## 2024-01-13 DIAGNOSIS — K222 Esophageal obstruction: Secondary | ICD-10-CM

## 2024-01-13 DIAGNOSIS — Z860100 Personal history of colon polyps, unspecified: Secondary | ICD-10-CM

## 2024-01-13 DIAGNOSIS — Z860101 Personal history of adenomatous and serrated colon polyps: Secondary | ICD-10-CM

## 2024-01-13 DIAGNOSIS — R1310 Dysphagia, unspecified: Secondary | ICD-10-CM

## 2024-01-13 MED ORDER — NA SULFATE-K SULFATE-MG SULF 17.5-3.13-1.6 GM/177ML PO SOLN: 1.0000 | Freq: Once | ORAL | 0 refills | Status: AC

## 2024-01-13 MED ORDER — PANTOPRAZOLE SODIUM 40 MG PO TBEC: 40.0000 mg | DELAYED_RELEASE_TABLET | Freq: Every day | ORAL | 3 refills | Status: AC

## 2024-01-13 NOTE — Patient Instructions (Signed)
 We have sent the following medications to your pharmacy for you to pick up at your convenience: Pantoprazole  40 mg once daily   You have been scheduled for an Endoscopy and Colonoscopy. Please follow the written instructions given to you at your visit today.  If you use inhalers (even only as needed), please bring them with you on the day of your procedure.  DO NOT TAKE 7 DAYS PRIOR TO TEST- Trulicity (dulaglutide) Ozempic, Wegovy (semaglutide) Mounjaro (tirzepatide) Bydureon Bcise (exanatide extended release)  DO NOT TAKE 1 DAY PRIOR TO YOUR TEST Rybelsus (semaglutide) Adlyxin (lixisenatide) Victoza (liraglutide) Byetta (exanatide) ___________________________________________________________________________  Please follow up sooner if symptoms increase or worsen __________________________________________________________________________  Due to recent changes in healthcare laws, you may see the results of your imaging and laboratory studies on MyChart before your provider has had a chance to review them.  We understand that in some cases there may be results that are confusing or concerning to you. Not all laboratory results come back in the same time frame and the provider may be waiting for multiple results in order to interpret others.  Please give us  48 hours in order for your provider to thoroughly review all the results before contacting the office for clarification of your results.   Thank you for trusting me with your gastrointestinal care!   Ellouise Console, PA-C _______________________________________________________  If your blood pressure at your visit was 140/90 or greater, please contact your primary care physician to follow up on this.  _______________________________________________________  If you are age 59 or older, your body mass index should be between 23-30. Your Body mass index is 26.64 kg/m. If this is out of the aforementioned range listed, please consider  follow up with your Primary Care Provider.  If you are age 8 or younger, your body mass index should be between 19-25. Your Body mass index is 26.64 kg/m. If this is out of the aformentioned range listed, please consider follow up with your Primary Care Provider.   ________________________________________________________  The [facility] providers would like to encourage you to use MYCHART to communicate with providers for non-urgent requests or questions.  Due to long hold times on the telephone, sending your provider a message by [facility] may be a faster and more efficient way to get a response.  Please allow 48 business hours for a response.  Please remember that this is for non-urgent requests.  _______________________________________________________

## 2024-01-13 NOTE — Progress Notes (Signed)
 Ellouise Console, PA-C [address] [place], [place]  [address] Phone: [phone]   Gastroenterology Consultation  Referring Provider:     Rollene Almarie LABOR, * Primary Care Physician:  Rollene Almarie LABOR, MD Primary Gastroenterologist:  Ellouise Console, PA-C / Glendia Holt, MD  Reason for Consultation:     Discuss Colonoscopy; Hx Colon Polyps        HPI:   Discussed the use of AI scribe software for clinical note transcription with the patient, who gave verbal consent to proceed.  He has history of adenomatous colon polyps, GERD with esophagitis, and esophageal stricture. History of Present Illness He has been experiencing recurrent solid food dysphagia for approximately six months, characterized by episodes of feeling 'choked' and difficulty swallowing, with a sensation of food getting stuck in the lower chest. No vomiting is present, although he occasionally feels the urge to vomit.  He experiences acid reflux and uses papaya enzyme for relief. He is prescribed pantoprazole  20 mg but takes it irregularly due to his travel schedule.  He experiences diarrhea more frequently than other gastrointestinal symptoms and suspects certain foods, like eggs, might trigger it. His extensive travel for work with [employer] involves eating various foods on the road, which may contribute to his symptoms.  No history of vomiting, rectal bleeding, or unusual weight loss is noted.  Family history is significant for a brother with stomach cancer.  06/2016 last EGD by Dr. Aneita: LA grade B esophagitis.  Moderate distal esophageal stricture at GE junction dilated to 15 mm.  Small hiatal hernia.  Otherwise normal stomach and duodenum.  Treated with Prilosec 40 mg once daily indefinitely.  06/2016 last colonoscopy by Dr. Aneita: 5 mm polyp removed from cecum.  2 (6 mm to 7 mm) polyps removed from transverse colon.  Good prep.  5-year repeat (was due 06/2021).  PMH: CAD with coronary stent  04/2020, diabetes, anxiety, hyperlipidemia, hypertension.  Takes 81 mg aspirin  daily.  No other blood thinners.  Past Medical History:  Diagnosis Date   Anxiety    CAD (coronary artery disease)    s/p Taxus DES to LAD in 2010 // Myoview  09/2018: low risk, normal EF    DM2 (diabetes mellitus, type 2) (HCC)    Hx of seasonal allergies    Hyperlipidemia    Hypertension     Past Surgical History:  Procedure Laterality Date   ANGIOPLASTY  2010   stent   CORONARY PRESSURE/FFR STUDY N/A 05/16/2020   Procedure: INTRAVASCULAR PRESSURE WIRE/FFR STUDY;  Surgeon: Dann Candyce RAMAN, MD;  Location: [facility] INVASIVE CV LAB;  Service: Cardiovascular;  Laterality: N/A;   CORONARY STENT INTERVENTION N/A 05/16/2020   Procedure: CORONARY STENT INTERVENTION;  Surgeon: Dann Candyce RAMAN, MD;  Location: [facility] INVASIVE CV LAB;  Service: Cardiovascular;  Laterality: N/A;   CORONARY ULTRASOUND/IVUS N/A 05/16/2020   Procedure: Intravascular Ultrasound/IVUS;  Surgeon: Dann Candyce RAMAN, MD;  Location: [facility] INVASIVE CV LAB;  Service: Cardiovascular;  Laterality: N/A;   LEFT HEART CATH AND CORONARY ANGIOGRAPHY N/A 05/16/2020   Procedure: LEFT HEART CATH AND CORONARY ANGIOGRAPHY;  Surgeon: Dann Candyce RAMAN, MD;  Location: [facility] INVASIVE CV LAB;  Service: Cardiovascular;  Laterality: N/A;   LUMBAR LAMINECTOMY  1991   L3 and T12    Prior to Admission medications   Medication Sig Start Date End Date Taking? Authorizing Provider  acetaminophen  (TYLENOL ) 500 MG tablet Take 1,000 mg by mouth every 8 (eight) hours as needed for moderate pain.  [provider]  amoxicillin -clavulanate (AUGMENTIN ) 875-125 MG tablet Take 1 tablet by mouth every 12 (twelve) hours. 12/03/23   McKenzie, Belvie CROME, MD  aspirin  EC 81 MG tablet Take 81 mg by mouth daily.    [provider]  atorvastatin  (LIPITOR) 80 MG tablet Take 1 tablet (80 mg total) by mouth daily. 12/11/21   Jeffrie Oneil BROCKS, MD  citalopram  (CELEXA ) 10 MG  tablet Take 1 tablet (10 mg total) by mouth daily. 01/01/24   Rollene Almarie LABOR, MD  finasteride  (PROSCAR ) 5 MG tablet Take 1 tablet (5 mg total) by mouth daily. 12/03/23   McKenzie, Belvie CROME, MD  fluticasone  (FLONASE ) 50 MCG/ACT nasal spray Place 2 sprays into both nostrils daily. 12/24/23   Rollene Almarie LABOR, MD  gabapentin  (NEURONTIN ) 300 MG capsule Take 2 capsules (600 mg total) by mouth at bedtime. And may take an additional 300 mg once a day if needed 10/07/23   Rollene Almarie LABOR, MD  glimepiride (AMARYL) 1 MG tablet Take 1 mg by mouth daily with breakfast.    [provider]  nitroGLYCERIN  (NITROSTAT ) 0.4 MG SL tablet Place 1 tablet (0.4 mg total) under the tongue every 5 (five) minutes as needed for chest pain. 05/10/20 11/11/23  Lelon Hamilton T, PA-C  pantoprazole  (PROTONIX ) 20 MG tablet Take 20 mg by mouth daily. Take one every other day    [provider]  promethazine-dextromethorphan (PROMETHAZINE-DM) 6.25-15 MG/5ML syrup Take 5 mLs by mouth every 6 (six) hours as needed. 11/04/23   [provider]  Saw Palmetto, Serenoa repens, (SAW PALMETTO PO) Take 100 mg by mouth 2 (two) times daily.    [provider]  tamsulosin  (FLOMAX ) 0.4 MG CAPS capsule Take 1 capsule (0.4 mg total) by mouth at bedtime. 12/03/23   McKenzie, Belvie CROME, MD  traMADol  (ULTRAM ) 50 MG tablet TAKE 1 TABLET BY MOUTH ONCE DAILY AS NEEDED 12/30/23   Rollene Almarie LABOR, MD  valsartan -hydrochlorothiazide  (DIOVAN -HCT) 320-12.5 MG tablet Take 1 tablet by mouth daily.    [provider]  vitamin B-12 (CYANOCOBALAMIN) 1000 MCG tablet Take 1,000 mcg by mouth 3 (three) times a week.    [provider]    Family History  Problem Relation Age of Onset   Breast cancer Mother    Lung cancer Father    Colon cancer Neg Hx    Liver disease Neg Hx    Pancreatic cancer Neg Hx      Social History   Tobacco Use   Smoking status: Never   Smokeless tobacco: Never   Vaping Use   Vaping status: Never Used  Substance Use Topics   Alcohol use: Yes    Alcohol/week: 1.0 standard drink of alcohol    Types: 1 Cans of beer per week    Comment: beer once or twice a month   Drug use: No    Allergies as of 01/13/2024 - Review Complete 01/13/2024  Allergen Reaction Noted   Chicken allergy Shortness Of Breath, Nausea And Vomiting, and Other (See Comments) 10/30/2015   Dicyclomine Hives 08/24/2021   Morphine Shortness Of Breath 02/08/2013   Other Anaphylaxis 10/30/2015   Pineapple Anaphylaxis and Rash 10/30/2015   Sulfa antibiotics Shortness Of Breath 10/30/2015   Amlodipine  besylate  02/24/2020   Chlorthalidone  02/24/2020   Naproxen Rash 02/08/2013    Review of Systems:    All systems reviewed and negative except where noted in HPI.   Physical Exam:  BP 122/60   Pulse (!) 52  Ht 5' 11 (1.803 m)   Wt 191 lb (86.6 kg)   BMI 26.64 kg/m  No LMP for male patient.  General:   Alert,  Well-developed, well-nourished, pleasant and cooperative in NAD Lungs:  Respirations even and unlabored.  Clear throughout to auscultation.   No wheezes, crackles, or rhonchi. No acute distress. Heart:  Regular rate and rhythm; no murmurs, clicks, rubs, or gallops. Abdomen:  Normal bowel sounds.  No bruits.  Soft, and non-distended without masses, hepatosplenomegaly or hernias noted.  No Tenderness.  No guarding or rebound tenderness.    Neurologic:  Alert and oriented x3;  grossly normal neurologically. Psych:  Alert and cooperative. Normal mood and affect.   Imaging Studies: No results found.  Labs: CBC    Component Value Date/Time   WBC 4.7 06/27/2023 1051   RBC 4.30 06/27/2023 1051   HGB 13.9 06/27/2023 1051   HGB 14.5 05/02/2022 1120   HCT 40.1 06/27/2023 1051   HCT 41.2 05/02/2022 1120   PLT 101.0 (L) 06/27/2023 1051   PLT 99 (LL) 05/03/2022 1637   MCV 93.3 06/27/2023 1051   MCV 91 05/02/2022 1120    CMP     Component Value Date/Time   NA 139  06/27/2023 1051   NA 139 05/02/2022 1120   K 4.6 06/27/2023 1051   CL 107 06/27/2023 1051   CO2 29 06/27/2023 1051   GLUCOSE 143 (H) 06/27/2023 1051   BUN 15 06/27/2023 1051   BUN 20 05/02/2022 1120   CREATININE 1.13 06/27/2023 1051   CREATININE 1.05 11/30/2015 0852   CALCIUM  9.1 06/27/2023 1051   PROT 6.5 06/27/2023 1051   PROT 6.1 05/02/2022 1120   ALBUMIN 4.2 06/27/2023 1051   ALBUMIN 4.2 05/02/2022 1120   AST 17 06/27/2023 1051   ALT 16 06/27/2023 1051   ALKPHOS 47 06/27/2023 1051   BILITOT 0.8 06/27/2023 1051   BILITOT 0.6 05/02/2022 1120   GFRNONAA >60 06/27/2018 1119   GFRAA >60 06/27/2018 1119    Assessment and Plan:   [name] is a 73 y.o. y/o male has been referred for:  1.  GERD with esophagitis - Increase pantoprazole  to 40 mg once daily, #90, 3 refills. - Continue GERD diet.  Rec. Avoid coffee, sodas, peppermint, garlic, onions, alcohol, citrus fruits, chocolate, tomatoes, fatty and spicey foods.  Avoid eating 2-3 hours before bedtime.    2.  History of esophageal stricture; recurrent solid food dysphagia x 6 months. - Scheduling EGD I discussed risks of EGD with patient to include risk of bleeding, perforation, and risk of sedation.  Patient expressed understanding and agrees to proceed with EGD.   3.  History of adenomatous colon polyps - Scheduling Colonoscopy I discussed risks of colonoscopy with patient to include risk of bleeding, colon perforation, and risk of sedation.  Patient expressed understanding and agrees to proceed with colonoscopy.   Follow up based on procedure results and GI symptoms.  Ellouise Console, PA-C

## 2024-01-19 ENCOUNTER — Encounter: Payer: Self-pay | Admitting: Gastroenterology

## 2024-01-19 ENCOUNTER — Ambulatory Visit: Admitting: Gastroenterology

## 2024-01-19 VITALS — BP 127/84 | HR 76 | Temp 96.8°F | Resp 19 | Ht 71.0 in | Wt 191.0 lb

## 2024-01-19 DIAGNOSIS — K222 Esophageal obstruction: Secondary | ICD-10-CM

## 2024-01-19 DIAGNOSIS — Z860100 Personal history of colon polyps, unspecified: Secondary | ICD-10-CM

## 2024-01-19 DIAGNOSIS — Z1211 Encounter for screening for malignant neoplasm of colon: Secondary | ICD-10-CM | POA: Diagnosis not present

## 2024-01-19 DIAGNOSIS — K2270 Barrett's esophagus without dysplasia: Secondary | ICD-10-CM | POA: Diagnosis not present

## 2024-01-19 DIAGNOSIS — K449 Diaphragmatic hernia without obstruction or gangrene: Secondary | ICD-10-CM | POA: Diagnosis not present

## 2024-01-19 DIAGNOSIS — K644 Residual hemorrhoidal skin tags: Secondary | ICD-10-CM | POA: Diagnosis not present

## 2024-01-19 DIAGNOSIS — K209 Esophagitis, unspecified without bleeding: Secondary | ICD-10-CM

## 2024-01-19 DIAGNOSIS — Z860101 Personal history of adenomatous and serrated colon polyps: Secondary | ICD-10-CM | POA: Diagnosis not present

## 2024-01-19 DIAGNOSIS — K208 Other esophagitis without bleeding: Secondary | ICD-10-CM | POA: Diagnosis not present

## 2024-01-19 DIAGNOSIS — R1310 Dysphagia, unspecified: Secondary | ICD-10-CM

## 2024-01-19 DIAGNOSIS — D123 Benign neoplasm of transverse colon: Secondary | ICD-10-CM

## 2024-01-19 DIAGNOSIS — K635 Polyp of colon: Secondary | ICD-10-CM | POA: Diagnosis not present

## 2024-01-19 MED ORDER — SODIUM CHLORIDE 0.9 % IV SOLN: 500.0000 mL | Freq: Once | INTRAVENOUS | Status: DC

## 2024-01-19 NOTE — Progress Notes (Signed)
 Vss nad trans to pacu

## 2024-01-19 NOTE — Progress Notes (Signed)
 Called to room to assist during endoscopic procedure.  Patient ID and intended procedure confirmed with present staff. Received instructions for my participation in the procedure from the performing physician.

## 2024-01-19 NOTE — Patient Instructions (Addendum)
 Resume previous diet  Continue present medications, continue once daily pantoprazole  indefinitely  Await pathology results See handouts on polyps and hiatal hernia YOU HAD AN ENDOSCOPIC PROCEDURE TODAY AT THE [facility]:   Refer to the procedure report that was given to you for any specific questions about what was found during the examination.  If the procedure report does not answer your questions, please call your gastroenterologist to clarify.  If you requested that your care partner not be given the details of your procedure findings, then the procedure report has been included in a sealed envelope for you to review at your convenience later.  YOU SHOULD EXPECT: Some feelings of bloating in the abdomen. Passage of more gas than usual.  Walking can help get rid of the air that was put into your GI tract during the procedure and reduce the bloating. If you had a lower endoscopy (such as a colonoscopy or flexible sigmoidoscopy) you may notice spotting of blood in your stool or on the toilet paper. If you underwent a bowel prep for your procedure, you may not have a normal bowel movement for a few days.  Please Note:  You might notice some irritation and congestion in your nose or some drainage.  This is from the oxygen used during your procedure.  There is no need for concern and it should clear up in a day or so.  SYMPTOMS TO REPORT IMMEDIATELY: Following lower endoscopy (colonoscopy or flexible sigmoidoscopy):  Excessive amounts of blood in the stool  Significant tenderness or worsening of abdominal pains  Swelling of the abdomen that is new, acute  Fever of 100F or higher Following upper endoscopy (EGD)  Vomiting of blood or coffee ground material  New chest pain or pain under the shoulder blades  Painful or persistently difficult swallowing  New shortness of breath  Black, tarry-looking stools  For urgent or emergent issues, a gastroenterologist can be reached at any hour  by calling [phone]. Do not use MyChart messaging for urgent concerns.   DIET:  We do recommend a small meal at first, but then you may proceed to your regular diet.  Drink plenty of fluids but you should avoid alcoholic beverages for 24 hours.  ACTIVITY:  You should plan to take it easy for the rest of today and you should NOT DRIVE or use heavy machinery until tomorrow (because of the sedation medicines used during the test).    FOLLOW UP: Our staff will call the number listed on your records the next business day following your procedure.  We will call around 7:15- 8:00 am to check on you and address any questions or concerns that you may have regarding the information given to you following your procedure. If we do not reach you, we will leave a message.     If any biopsies were taken you will be contacted by phone or by letter within the next 1-3 weeks.  Please call us  at [phone] if you have not heard about the biopsies in 3 weeks.   SIGNATURES/CONFIDENTIALITY: You and/or your care partner have signed paperwork which will be entered into your electronic medical record.  These signatures attest to the fact that that the information above on your After Visit Summary has been reviewed and is understood.  Full responsibility of the confidentiality of this discharge information lies with you and/or your care-partner.

## 2024-01-19 NOTE — Op Note (Signed)
 [facility] Patient Name: [name] Procedure Date: 01/19/2024 8:40 AM MRN: [id number] Endoscopist: Glendia E. Stacia , MD, [id number] Age: 73 Referring MD:  Date of Birth: 1950-07-19 Gender: Male Account #: [id number] Procedure:                Upper GI endoscopy Indications:              Dysphagia, history of peptic stricture Medicines:                Monitored Anesthesia Care Procedure:                Pre-Anesthesia Assessment:                           - Prior to the procedure, a History and Physical                            was performed, and patient medications and                            allergies were reviewed. The patient's tolerance of                            previous anesthesia was also reviewed. The risks                            and benefits of the procedure and the sedation                            options and risks were discussed with the patient.                            All questions were answered, and informed consent                            was obtained. Prior Anticoagulants: The patient has                            taken no anticoagulant or antiplatelet agents                            except for aspirin . ASA Grade Assessment: III - A                            patient with severe systemic disease. After                            reviewing the risks and benefits, the patient was                            deemed in satisfactory condition to undergo the                            procedure.  After obtaining informed consent, the endoscope was                            passed under direct vision. Throughout the                            procedure, the patient's blood pressure, pulse, and                            oxygen saturations were monitored continuously. The                            GIF [id number] was introduced through the                            mouth, and advanced to the second part of  duodenum.                            The upper GI endoscopy was accomplished without                            difficulty. The patient tolerated the procedure                            well. Scope In: Scope Out: Findings:                 One benign-appearing, intrinsic mild stenosis was                            found in the distal esophagus. This stenosis                            measured 1.5 cm (inner diameter) x less than one cm                            (in length). The stenosis was traversed. A TTS                            dilator was passed through the scope. Dilation with                            a 15-16.5-18 mm balloon dilator was performed to 18                            mm. The dilation site was examined and showed mild                            mucosal disruption. Estimated blood loss was                            minimal.  The Z-line was irregular. Biopsies were taken with                            a cold forceps for histology. Estimated blood loss                            was minimal.                           The exam of the esophagus was otherwise normal.                           A 4 cm hiatal hernia was present.                           The exam of the stomach was otherwise normal.                           The examined duodenum was normal. Complications:            No immediate complications. Estimated Blood Loss:     Estimated blood loss was minimal. Impression:               - Benign-appearing esophageal stenosis. Dilated.                           - Z-line irregular. Biopsied.                           - 4 cm hiatal hernia.                           - Normal examined duodenum. Recommendation:           - Patient has a contact number available for                            emergencies. The signs and symptoms of potential                            delayed complications were discussed with the                             patient. Return to normal activities tomorrow.                            Written discharge instructions were provided to the                            patient.                           - Resume previous diet.                           - Continue present medications.                           -  Await pathology results.                           - Continue once daily pantoprazole  indefinitely                            given recurrent peptic stricture. Rajeev Escue E. Stacia, MD 01/19/2024 9:29:49 AM This report has been signed electronically.

## 2024-01-19 NOTE — Progress Notes (Signed)
 History and Physical Interval Note:  01/19/2024 7:55 AM  [name]  has presented today for endoscopic procedure(s), with the diagnosis of  Encounter Diagnoses  Name Primary?   Dysphagia, unspecified type Yes   History of colon polyps   .  The various methods of evaluation and treatment have been discussed with the patient and/or family. After consideration of risks, benefits and other options for treatment, the patient has consented to  the endoscopic procedure(s).   The patient's history has been reviewed, patient examined, no change in status, stable for endoscopic procedure(s).  I have reviewed the patient's chart and labs.  Questions were answered to the patient's satisfaction.     [name] E. Stacia, MD [facility]astroenter[facility]

## 2024-01-19 NOTE — Op Note (Signed)
 [facility] Patient Name: [name] Procedure Date: 01/19/2024 8:34 AM MRN: [id number] Endoscopist: Glendia E. Stacia , MD, [id number] Age: 73 Referring MD:  Date of Birth: January 26, 1951 Gender: Male Account #: [id number] Procedure:                Colonoscopy Indications:              Surveillance: Personal history of adenomatous                            polyps on last colonoscopy > 5 years ago Medicines:                Monitored Anesthesia Care Procedure:                Pre-Anesthesia Assessment:                           - Prior to the procedure, a History and Physical                            was performed, and patient medications and                            allergies were reviewed. The patient's tolerance of                            previous anesthesia was also reviewed. The risks                            and benefits of the procedure and the sedation                            options and risks were discussed with the patient.                            All questions were answered, and informed consent                            was obtained. Prior Anticoagulants: The patient has                            taken no anticoagulant or antiplatelet agents                            except for aspirin . ASA Grade Assessment: III - A                            patient with severe systemic disease. After                            reviewing the risks and benefits, the patient was                            deemed in satisfactory condition to undergo the  procedure.                           After obtaining informed consent, the colonoscope                            was passed under direct vision. Throughout the                            procedure, the patient's blood pressure, pulse, and                            oxygen saturations were monitored continuously. The                            Olympus Scope [id number] was introduced  through the                            anus and advanced to the the cecum, identified by                            appendiceal orifice and ileocecal valve. The                            colonoscopy was performed without difficulty. The                            patient tolerated the procedure well. The quality                            of the bowel preparation was adequate. The                            ileocecal valve, appendiceal orifice, and rectum                            were photographed. The bowel preparation used was                            SUPREP via split dose instruction. Scope In: 9:04:21 AM Scope Out: 9:22:42 AM Scope Withdrawal Time: 0 hours 15 minutes 11 seconds  Total Procedure Duration: 0 hours 18 minutes 21 seconds  Findings:                 Hemorrhoids were found on perianal exam.                           The digital rectal exam was normal. Pertinent                            negatives include normal sphincter tone and no                            palpable rectal lesions.  Two sessile polyps were found in the transverse                            colon. The polyps were 3 to 4 mm in size. These                            polyps were removed with a cold snare. Resection                            and retrieval were complete. Estimated blood loss                            was minimal.                           A 25 mm polyp was found in the transverse colon.                            The polyp was flat. Area was tattooed with an                            injection of 3 mL of Spot (carbon black). Estimated                            blood loss was minimal.                           The exam was otherwise normal throughout the                            examined colon.                           The retroflexed view of the distal rectum and anal                            verge was normal and showed no anal or rectal                             abnormalities. Complications:            No immediate complications. Estimated Blood Loss:     Estimated blood loss was minimal. Impression:               - Hemorrhoids found on perianal exam.                           - Two 3 to 4 mm polyps in the transverse colon,                            removed with a cold snare. Resected and retrieved.                           - One 25 mm polyp in the transverse colon.  Tattooed.                           - The distal rectum and anal verge are normal on                            retroflexion view. Recommendation:           - Patient has a contact number available for                            emergencies. The signs and symptoms of potential                            delayed complications were discussed with the                            patient. Return to normal activities tomorrow.                            Written discharge instructions were provided to the                            patient.                           - Resume previous diet.                           - Continue present medications.                           - Await pathology results.                           - Repeat colonoscopy at the next available hospital                            appointment for EMR of large transverse colon polyp. Castella Lerner E. Stacia, MD 01/19/2024 9:35:57 AM This report has been signed electronically.

## 2024-01-20 ENCOUNTER — Telehealth: Payer: Self-pay

## 2024-01-20 NOTE — Telephone Encounter (Signed)
 Attempted f/u call. No answer, left VM.

## 2024-01-21 LAB — SURGICAL PATHOLOGY

## 2024-01-22 ENCOUNTER — Ambulatory Visit: Payer: Self-pay | Admitting: Gastroenterology

## 2024-01-22 NOTE — Progress Notes (Signed)
 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    Barrett's esophagus is a change in the lining of the esophagus associated with long term acid reflux and is associated with an increased risk of esophageal cancer.  The risk of esophageal cancer is still very low, but it is recommended that patient's with Barrett's esophagus undergo 'surveillance' endoscopies every 3-5 years. It is also recommended that patient's with Barrett's esophagus take a proton pump inhibitor (medication to reduce stomach acid) daily. Let's plan to repeat an EGD in 3 years.   Please plan to continue your pantoprazole  once a day indefinitely.  The polyp which I removed during your recent colonoscopy was proven to be completely benign but is considered a pre-cancerous polyp that MAY have grown into cancer if it had not been removed.  Studies shows that at least 20% of women over age 29 and 30% of men over age 67 have pre-cancerous polyps.  As you know, there is a large polyp in the colon that still needs to be removed in the hospital setting.   We will contact you when we have an available hospital colonoscopy slot available.

## 2024-01-30 ENCOUNTER — Telehealth: Payer: Self-pay

## 2024-01-30 NOTE — Telephone Encounter (Signed)
 Left message for patient to return my call.

## 2024-02-02 NOTE — Progress Notes (Signed)
 Agree with the assessment and plan as outlined by Brigitte Canard, PA-C.

## 2024-02-04 NOTE — Telephone Encounter (Signed)
 Left message for patient to return my call.

## 2024-02-05 ENCOUNTER — Other Ambulatory Visit: Payer: Self-pay

## 2024-02-05 DIAGNOSIS — Z860100 Personal history of colon polyps, unspecified: Secondary | ICD-10-CM

## 2024-02-05 MED ORDER — NA SULFATE-K SULFATE-MG SULF 17.5-3.13-1.6 GM/177ML PO SOLN: 1.0000 | Freq: Once | ORAL | 0 refills | Status: AC

## 2024-02-05 NOTE — Telephone Encounter (Signed)
 Called patient and scheduled his colon with EMR on 04/06/24 at 12:50 pm. Informed patient I will put his instructions on MyChart and to contact me with any questions. Patient verbalized understanding.

## 2024-03-04 ENCOUNTER — Other Ambulatory Visit: Payer: Self-pay | Admitting: Internal Medicine

## 2024-03-04 NOTE — Telephone Encounter (Signed)
 Copied from CRM [id number]. Topic: Clinical - Medication Refill >> Mar 04, 2024  4:18 PM Kevelyn M wrote: Medication: glimepride  Has the patient contacted their pharmacy? No (Agent: If no, request that the patient contact the pharmacy for the refill. If patient does not wish to contact the pharmacy document the reason why and proceed with request.) (Agent: If yes, when and what did the pharmacy advise?)  This is the patient's preferred pharmacy:  [facility] [address] [place], [place] - [address] [facility] [address] [place] [address] Phone: [phone] Fax: [phone]  Is this the correct pharmacy for this prescription? Yes If no, delete pharmacy and type the correct one.   Has the prescription been filled recently? No  Is the patient out of the medication? No  Has the patient been seen for an appointment in the last year OR does the patient have an upcoming appointment? Yes  Can we respond through MyChart? Yes  Agent: Please be advised that Rx refills may take up to 3 business days. We ask that you follow-up with your pharmacy.

## 2024-03-05 ENCOUNTER — Telehealth: Payer: Self-pay

## 2024-03-05 MED ORDER — GLIMEPIRIDE 1 MG PO TABS: 1.0000 mg | ORAL_TABLET | Freq: Every day | ORAL | 1 refills | Status: AC

## 2024-03-05 NOTE — Telephone Encounter (Signed)
 Copied from CRM [id number]. Topic: Clinical - Medication Refill >> Mar 05, 2024 12:07 PM Thersia BROCKS wrote: Medication: glimepiride  (AMARYL ) 1 MG tablet   Has the patient contacted their pharmacy? Yes (Agent: If no, request that the patient contact the pharmacy for the refill. If patient does not wish to contact the pharmacy document the reason why and proceed with request.) (Agent: If yes, when and what did the pharmacy advise?)  This is the patient's preferred pharmacy:  [facility] [address] [place], [place] - [address] [facility] [address] [place] [address] Phone: [phone] Fax: [phone]    Is this the correct pharmacy for this prescription? Yes If no, delete pharmacy and type the correct one.   Has the prescription been filled recently? No  Is the patient out of the medication? Yes  Has the patient been seen for an appointment in the last year OR does the patient have an upcoming appointment? Yes  Can we respond through MyChart? Yes  Agent: Please be advised that Rx refills may take up to 3 business days. We ask that you follow-up with your pharmacy.

## 2024-03-05 NOTE — Telephone Encounter (Signed)
 Spoke with pharmacy and rx request for glimepiride  was received. Pt was in line to pick up medication when we were on the phone.

## 2024-03-16 ENCOUNTER — Telehealth: Payer: Self-pay | Admitting: Gastroenterology

## 2024-03-16 NOTE — Telephone Encounter (Signed)
 Patient is advised that we currently do not have any sooner openings for procedure at the hospital. 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 Patient will see if he can arrange something with work to keep 04/06/24 procedure and will call us  if he ends up needing to reschedule to 05/2024 once a schedule becomes available.

## 2024-03-16 NOTE — Telephone Encounter (Signed)
 Patient is requesting a call to discuss if there is sooner availability for 2/17 [facility] procedure. Please advise, thank you

## 2024-03-24 NOTE — Telephone Encounter (Signed)
 04/06/24 appointment has been cancelled. Patient placed back on hospital wait list.

## 2024-03-24 NOTE — Telephone Encounter (Signed)
 Inbound call from patient requesting to cancel his procedure for the 17 th of this month and get a call back for when April schedule is released. Please advise.

## 2024-04-06 ENCOUNTER — Encounter (HOSPITAL_COMMUNITY): Admission: RE | Payer: Self-pay | Source: Home / Self Care

## 2024-04-06 ENCOUNTER — Ambulatory Visit (HOSPITAL_COMMUNITY): Admission: RE | Admit: 2024-04-06 | Source: Home / Self Care | Admitting: Gastroenterology

## 2024-09-23 ENCOUNTER — Ambulatory Visit

## 2024-12-08 ENCOUNTER — Ambulatory Visit: Admitting: Urology
